# Patient Record
Sex: Female | Born: 1950 | Race: Black or African American | Hispanic: No | Marital: Single | Smoking: Former smoker
Health system: Southern US, Community
[De-identification: ages and names within clinical notes are randomized; demographics above are authoritative.]

## PROBLEM LIST (undated history)

## (undated) DIAGNOSIS — I739 Peripheral vascular disease, unspecified: Secondary | ICD-10-CM

## (undated) DIAGNOSIS — J45909 Unspecified asthma, uncomplicated: Secondary | ICD-10-CM

## (undated) HISTORY — PX: APPENDECTOMY: SHX54

## (undated) HISTORY — PX: ABOVE KNEE LEG AMPUTATION: SUR20

## (undated) HISTORY — PX: COLON SURGERY: SHX602

---

## 2014-07-19 DIAGNOSIS — S8990XA Unspecified injury of unspecified lower leg, initial encounter: Secondary | ICD-10-CM | POA: Diagnosis not present

## 2014-07-19 DIAGNOSIS — J45909 Unspecified asthma, uncomplicated: Secondary | ICD-10-CM | POA: Diagnosis not present

## 2014-07-19 DIAGNOSIS — F17200 Nicotine dependence, unspecified, uncomplicated: Secondary | ICD-10-CM | POA: Diagnosis not present

## 2014-07-19 DIAGNOSIS — S93409A Sprain of unspecified ligament of unspecified ankle, initial encounter: Secondary | ICD-10-CM | POA: Diagnosis not present

## 2014-07-19 DIAGNOSIS — S99919A Unspecified injury of unspecified ankle, initial encounter: Secondary | ICD-10-CM | POA: Diagnosis not present

## 2014-11-21 DIAGNOSIS — I739 Peripheral vascular disease, unspecified: Secondary | ICD-10-CM | POA: Diagnosis not present

## 2014-11-21 DIAGNOSIS — I70401 Unspecified atherosclerosis of autologous vein bypass graft(s) of the extremities, right leg: Secondary | ICD-10-CM | POA: Diagnosis not present

## 2014-11-21 DIAGNOSIS — I70402 Unspecified atherosclerosis of autologous vein bypass graft(s) of the extremities, left leg: Secondary | ICD-10-CM | POA: Diagnosis not present

## 2014-11-21 DIAGNOSIS — F17210 Nicotine dependence, cigarettes, uncomplicated: Secondary | ICD-10-CM | POA: Diagnosis not present

## 2014-11-21 DIAGNOSIS — J45909 Unspecified asthma, uncomplicated: Secondary | ICD-10-CM | POA: Diagnosis not present

## 2014-11-21 DIAGNOSIS — I771 Stricture of artery: Secondary | ICD-10-CM | POA: Diagnosis not present

## 2014-11-22 DIAGNOSIS — Z01818 Encounter for other preprocedural examination: Secondary | ICD-10-CM | POA: Diagnosis not present

## 2014-11-22 DIAGNOSIS — I739 Peripheral vascular disease, unspecified: Secondary | ICD-10-CM | POA: Diagnosis not present

## 2014-11-22 DIAGNOSIS — I2510 Atherosclerotic heart disease of native coronary artery without angina pectoris: Secondary | ICD-10-CM | POA: Diagnosis not present

## 2014-11-22 DIAGNOSIS — D72829 Elevated white blood cell count, unspecified: Secondary | ICD-10-CM | POA: Diagnosis not present

## 2014-11-22 DIAGNOSIS — K922 Gastrointestinal hemorrhage, unspecified: Secondary | ICD-10-CM | POA: Diagnosis not present

## 2014-11-22 DIAGNOSIS — M2011 Hallux valgus (acquired), right foot: Secondary | ICD-10-CM | POA: Diagnosis not present

## 2014-11-22 DIAGNOSIS — M79605 Pain in left leg: Secondary | ICD-10-CM | POA: Diagnosis not present

## 2014-11-22 DIAGNOSIS — I70223 Atherosclerosis of native arteries of extremities with rest pain, bilateral legs: Secondary | ICD-10-CM | POA: Diagnosis not present

## 2014-11-23 DIAGNOSIS — D72829 Elevated white blood cell count, unspecified: Secondary | ICD-10-CM | POA: Diagnosis not present

## 2014-11-23 DIAGNOSIS — K922 Gastrointestinal hemorrhage, unspecified: Secondary | ICD-10-CM | POA: Diagnosis not present

## 2014-11-23 DIAGNOSIS — I739 Peripheral vascular disease, unspecified: Secondary | ICD-10-CM | POA: Diagnosis not present

## 2014-11-23 DIAGNOSIS — K635 Polyp of colon: Secondary | ICD-10-CM | POA: Diagnosis not present

## 2014-11-23 DIAGNOSIS — M79605 Pain in left leg: Secondary | ICD-10-CM | POA: Diagnosis not present

## 2014-11-23 DIAGNOSIS — K625 Hemorrhage of anus and rectum: Secondary | ICD-10-CM | POA: Diagnosis not present

## 2014-11-24 DIAGNOSIS — D72829 Elevated white blood cell count, unspecified: Secondary | ICD-10-CM | POA: Diagnosis not present

## 2014-11-24 DIAGNOSIS — M79605 Pain in left leg: Secondary | ICD-10-CM | POA: Diagnosis not present

## 2014-11-24 DIAGNOSIS — I739 Peripheral vascular disease, unspecified: Secondary | ICD-10-CM | POA: Diagnosis not present

## 2014-11-24 DIAGNOSIS — K922 Gastrointestinal hemorrhage, unspecified: Secondary | ICD-10-CM | POA: Diagnosis not present

## 2014-11-24 DIAGNOSIS — K625 Hemorrhage of anus and rectum: Secondary | ICD-10-CM | POA: Diagnosis not present

## 2014-11-24 DIAGNOSIS — K635 Polyp of colon: Secondary | ICD-10-CM | POA: Diagnosis not present

## 2014-11-25 DIAGNOSIS — K922 Gastrointestinal hemorrhage, unspecified: Secondary | ICD-10-CM | POA: Diagnosis not present

## 2014-11-25 DIAGNOSIS — M79605 Pain in left leg: Secondary | ICD-10-CM | POA: Diagnosis not present

## 2014-11-25 DIAGNOSIS — D72829 Elevated white blood cell count, unspecified: Secondary | ICD-10-CM | POA: Diagnosis not present

## 2014-11-25 DIAGNOSIS — I739 Peripheral vascular disease, unspecified: Secondary | ICD-10-CM | POA: Diagnosis not present

## 2014-11-26 DIAGNOSIS — I739 Peripheral vascular disease, unspecified: Secondary | ICD-10-CM | POA: Diagnosis not present

## 2014-11-26 DIAGNOSIS — D72829 Elevated white blood cell count, unspecified: Secondary | ICD-10-CM | POA: Diagnosis not present

## 2014-11-26 DIAGNOSIS — K922 Gastrointestinal hemorrhage, unspecified: Secondary | ICD-10-CM | POA: Diagnosis not present

## 2014-11-26 DIAGNOSIS — M79605 Pain in left leg: Secondary | ICD-10-CM | POA: Diagnosis not present

## 2014-11-26 DIAGNOSIS — I70221 Atherosclerosis of native arteries of extremities with rest pain, right leg: Secondary | ICD-10-CM | POA: Diagnosis not present

## 2014-11-27 DIAGNOSIS — N390 Urinary tract infection, site not specified: Secondary | ICD-10-CM | POA: Diagnosis not present

## 2014-11-27 DIAGNOSIS — I739 Peripheral vascular disease, unspecified: Secondary | ICD-10-CM | POA: Diagnosis not present

## 2014-11-27 DIAGNOSIS — D72829 Elevated white blood cell count, unspecified: Secondary | ICD-10-CM | POA: Diagnosis not present

## 2014-11-27 DIAGNOSIS — M79605 Pain in left leg: Secondary | ICD-10-CM | POA: Diagnosis not present

## 2014-11-28 DIAGNOSIS — M79605 Pain in left leg: Secondary | ICD-10-CM | POA: Diagnosis not present

## 2014-11-28 DIAGNOSIS — N390 Urinary tract infection, site not specified: Secondary | ICD-10-CM | POA: Diagnosis not present

## 2014-11-28 DIAGNOSIS — I739 Peripheral vascular disease, unspecified: Secondary | ICD-10-CM | POA: Diagnosis not present

## 2014-11-28 DIAGNOSIS — D72829 Elevated white blood cell count, unspecified: Secondary | ICD-10-CM | POA: Diagnosis not present

## 2014-11-29 DIAGNOSIS — K5669 Other intestinal obstruction: Secondary | ICD-10-CM | POA: Diagnosis not present

## 2014-11-29 DIAGNOSIS — N390 Urinary tract infection, site not specified: Secondary | ICD-10-CM | POA: Diagnosis not present

## 2014-11-29 DIAGNOSIS — I739 Peripheral vascular disease, unspecified: Secondary | ICD-10-CM | POA: Diagnosis not present

## 2014-11-29 DIAGNOSIS — B952 Enterococcus as the cause of diseases classified elsewhere: Secondary | ICD-10-CM | POA: Diagnosis not present

## 2014-11-29 DIAGNOSIS — K5780 Diverticulitis of intestine, part unspecified, with perforation and abscess without bleeding: Secondary | ICD-10-CM | POA: Diagnosis not present

## 2014-11-29 DIAGNOSIS — K632 Fistula of intestine: Secondary | ICD-10-CM | POA: Diagnosis not present

## 2014-11-29 DIAGNOSIS — M79605 Pain in left leg: Secondary | ICD-10-CM | POA: Diagnosis not present

## 2014-11-29 DIAGNOSIS — M726 Necrotizing fasciitis: Secondary | ICD-10-CM | POA: Diagnosis not present

## 2014-11-29 DIAGNOSIS — K5660 Unspecified intestinal obstruction: Secondary | ICD-10-CM | POA: Diagnosis not present

## 2014-11-29 DIAGNOSIS — D72829 Elevated white blood cell count, unspecified: Secondary | ICD-10-CM | POA: Diagnosis not present

## 2014-11-30 DIAGNOSIS — N390 Urinary tract infection, site not specified: Secondary | ICD-10-CM | POA: Diagnosis not present

## 2014-11-30 DIAGNOSIS — D72829 Elevated white blood cell count, unspecified: Secondary | ICD-10-CM | POA: Diagnosis not present

## 2014-11-30 DIAGNOSIS — M79605 Pain in left leg: Secondary | ICD-10-CM | POA: Diagnosis not present

## 2014-11-30 DIAGNOSIS — I739 Peripheral vascular disease, unspecified: Secondary | ICD-10-CM | POA: Diagnosis not present

## 2014-12-01 DIAGNOSIS — N390 Urinary tract infection, site not specified: Secondary | ICD-10-CM | POA: Diagnosis not present

## 2014-12-01 DIAGNOSIS — K5660 Unspecified intestinal obstruction: Secondary | ICD-10-CM | POA: Diagnosis not present

## 2014-12-01 DIAGNOSIS — D72829 Elevated white blood cell count, unspecified: Secondary | ICD-10-CM | POA: Diagnosis not present

## 2014-12-01 DIAGNOSIS — K5669 Other intestinal obstruction: Secondary | ICD-10-CM | POA: Diagnosis not present

## 2014-12-01 DIAGNOSIS — K8020 Calculus of gallbladder without cholecystitis without obstruction: Secondary | ICD-10-CM | POA: Diagnosis not present

## 2014-12-01 DIAGNOSIS — M79605 Pain in left leg: Secondary | ICD-10-CM | POA: Diagnosis not present

## 2014-12-01 DIAGNOSIS — Z01810 Encounter for preprocedural cardiovascular examination: Secondary | ICD-10-CM | POA: Diagnosis not present

## 2014-12-01 DIAGNOSIS — I739 Peripheral vascular disease, unspecified: Secondary | ICD-10-CM | POA: Diagnosis not present

## 2014-12-02 DIAGNOSIS — K5660 Unspecified intestinal obstruction: Secondary | ICD-10-CM | POA: Diagnosis not present

## 2014-12-02 DIAGNOSIS — D72829 Elevated white blood cell count, unspecified: Secondary | ICD-10-CM | POA: Diagnosis not present

## 2014-12-02 DIAGNOSIS — K631 Perforation of intestine (nontraumatic): Secondary | ICD-10-CM | POA: Diagnosis not present

## 2014-12-02 DIAGNOSIS — I739 Peripheral vascular disease, unspecified: Secondary | ICD-10-CM | POA: Diagnosis not present

## 2014-12-02 DIAGNOSIS — Z4682 Encounter for fitting and adjustment of non-vascular catheter: Secondary | ICD-10-CM | POA: Diagnosis not present

## 2014-12-02 DIAGNOSIS — M79605 Pain in left leg: Secondary | ICD-10-CM | POA: Diagnosis not present

## 2014-12-02 DIAGNOSIS — N390 Urinary tract infection, site not specified: Secondary | ICD-10-CM | POA: Diagnosis not present

## 2014-12-02 DIAGNOSIS — K635 Polyp of colon: Secondary | ICD-10-CM | POA: Diagnosis not present

## 2014-12-03 DIAGNOSIS — D72829 Elevated white blood cell count, unspecified: Secondary | ICD-10-CM | POA: Diagnosis not present

## 2014-12-03 DIAGNOSIS — K922 Gastrointestinal hemorrhage, unspecified: Secondary | ICD-10-CM | POA: Diagnosis not present

## 2014-12-03 DIAGNOSIS — K631 Perforation of intestine (nontraumatic): Secondary | ICD-10-CM | POA: Diagnosis not present

## 2014-12-03 DIAGNOSIS — I739 Peripheral vascular disease, unspecified: Secondary | ICD-10-CM | POA: Diagnosis not present

## 2014-12-03 DIAGNOSIS — M79605 Pain in left leg: Secondary | ICD-10-CM | POA: Diagnosis not present

## 2014-12-04 DIAGNOSIS — M79605 Pain in left leg: Secondary | ICD-10-CM | POA: Diagnosis not present

## 2014-12-04 DIAGNOSIS — D72829 Elevated white blood cell count, unspecified: Secondary | ICD-10-CM | POA: Diagnosis not present

## 2014-12-04 DIAGNOSIS — K651 Peritoneal abscess: Secondary | ICD-10-CM | POA: Diagnosis not present

## 2014-12-04 DIAGNOSIS — J9690 Respiratory failure, unspecified, unspecified whether with hypoxia or hypercapnia: Secondary | ICD-10-CM | POA: Diagnosis not present

## 2014-12-04 DIAGNOSIS — A419 Sepsis, unspecified organism: Secondary | ICD-10-CM | POA: Diagnosis not present

## 2014-12-04 DIAGNOSIS — K922 Gastrointestinal hemorrhage, unspecified: Secondary | ICD-10-CM | POA: Diagnosis not present

## 2014-12-04 DIAGNOSIS — I743 Embolism and thrombosis of arteries of the lower extremities: Secondary | ICD-10-CM | POA: Diagnosis not present

## 2014-12-04 DIAGNOSIS — I739 Peripheral vascular disease, unspecified: Secondary | ICD-10-CM | POA: Diagnosis not present

## 2014-12-05 DIAGNOSIS — M79605 Pain in left leg: Secondary | ICD-10-CM | POA: Diagnosis not present

## 2014-12-05 DIAGNOSIS — D72829 Elevated white blood cell count, unspecified: Secondary | ICD-10-CM | POA: Diagnosis not present

## 2014-12-05 DIAGNOSIS — I739 Peripheral vascular disease, unspecified: Secondary | ICD-10-CM | POA: Diagnosis not present

## 2014-12-05 DIAGNOSIS — K922 Gastrointestinal hemorrhage, unspecified: Secondary | ICD-10-CM | POA: Diagnosis not present

## 2014-12-06 DIAGNOSIS — I70201 Unspecified atherosclerosis of native arteries of extremities, right leg: Secondary | ICD-10-CM | POA: Diagnosis not present

## 2014-12-06 DIAGNOSIS — K635 Polyp of colon: Secondary | ICD-10-CM | POA: Diagnosis not present

## 2014-12-06 DIAGNOSIS — I2510 Atherosclerotic heart disease of native coronary artery without angina pectoris: Secondary | ICD-10-CM | POA: Diagnosis not present

## 2014-12-06 DIAGNOSIS — M726 Necrotizing fasciitis: Secondary | ICD-10-CM | POA: Diagnosis not present

## 2014-12-06 DIAGNOSIS — J9811 Atelectasis: Secondary | ICD-10-CM | POA: Diagnosis not present

## 2014-12-06 DIAGNOSIS — K5660 Unspecified intestinal obstruction: Secondary | ICD-10-CM | POA: Diagnosis not present

## 2014-12-06 DIAGNOSIS — K922 Gastrointestinal hemorrhage, unspecified: Secondary | ICD-10-CM | POA: Diagnosis not present

## 2014-12-06 DIAGNOSIS — K630 Abscess of intestine: Secondary | ICD-10-CM | POA: Diagnosis not present

## 2014-12-06 DIAGNOSIS — K5780 Diverticulitis of intestine, part unspecified, with perforation and abscess without bleeding: Secondary | ICD-10-CM | POA: Diagnosis not present

## 2014-12-06 DIAGNOSIS — M79605 Pain in left leg: Secondary | ICD-10-CM | POA: Diagnosis not present

## 2014-12-06 DIAGNOSIS — D125 Benign neoplasm of sigmoid colon: Secondary | ICD-10-CM | POA: Diagnosis not present

## 2014-12-06 DIAGNOSIS — B952 Enterococcus as the cause of diseases classified elsewhere: Secondary | ICD-10-CM | POA: Diagnosis not present

## 2014-12-06 DIAGNOSIS — K5669 Other intestinal obstruction: Secondary | ICD-10-CM | POA: Diagnosis not present

## 2014-12-06 DIAGNOSIS — D72829 Elevated white blood cell count, unspecified: Secondary | ICD-10-CM | POA: Diagnosis not present

## 2014-12-06 DIAGNOSIS — I739 Peripheral vascular disease, unspecified: Secondary | ICD-10-CM | POA: Diagnosis not present

## 2014-12-06 DIAGNOSIS — K632 Fistula of intestine: Secondary | ICD-10-CM | POA: Diagnosis not present

## 2014-12-07 DIAGNOSIS — I509 Heart failure, unspecified: Secondary | ICD-10-CM | POA: Diagnosis not present

## 2014-12-07 DIAGNOSIS — R918 Other nonspecific abnormal finding of lung field: Secondary | ICD-10-CM | POA: Diagnosis not present

## 2014-12-07 DIAGNOSIS — Z9889 Other specified postprocedural states: Secondary | ICD-10-CM | POA: Diagnosis not present

## 2014-12-07 DIAGNOSIS — K567 Ileus, unspecified: Secondary | ICD-10-CM | POA: Diagnosis not present

## 2014-12-07 DIAGNOSIS — J90 Pleural effusion, not elsewhere classified: Secondary | ICD-10-CM | POA: Diagnosis not present

## 2014-12-07 DIAGNOSIS — I739 Peripheral vascular disease, unspecified: Secondary | ICD-10-CM | POA: Diagnosis not present

## 2014-12-08 DIAGNOSIS — I739 Peripheral vascular disease, unspecified: Secondary | ICD-10-CM | POA: Diagnosis not present

## 2014-12-08 DIAGNOSIS — R0689 Other abnormalities of breathing: Secondary | ICD-10-CM | POA: Diagnosis not present

## 2014-12-08 DIAGNOSIS — K567 Ileus, unspecified: Secondary | ICD-10-CM | POA: Diagnosis not present

## 2014-12-09 DIAGNOSIS — I743 Embolism and thrombosis of arteries of the lower extremities: Secondary | ICD-10-CM | POA: Diagnosis not present

## 2014-12-09 DIAGNOSIS — J9690 Respiratory failure, unspecified, unspecified whether with hypoxia or hypercapnia: Secondary | ICD-10-CM | POA: Diagnosis not present

## 2014-12-09 DIAGNOSIS — K651 Peritoneal abscess: Secondary | ICD-10-CM | POA: Diagnosis not present

## 2014-12-09 DIAGNOSIS — K567 Ileus, unspecified: Secondary | ICD-10-CM | POA: Diagnosis not present

## 2014-12-09 DIAGNOSIS — J811 Chronic pulmonary edema: Secondary | ICD-10-CM | POA: Diagnosis not present

## 2014-12-09 DIAGNOSIS — I739 Peripheral vascular disease, unspecified: Secondary | ICD-10-CM | POA: Diagnosis not present

## 2014-12-09 DIAGNOSIS — A419 Sepsis, unspecified organism: Secondary | ICD-10-CM | POA: Diagnosis not present

## 2014-12-10 DIAGNOSIS — K567 Ileus, unspecified: Secondary | ICD-10-CM | POA: Diagnosis not present

## 2014-12-10 DIAGNOSIS — I739 Peripheral vascular disease, unspecified: Secondary | ICD-10-CM | POA: Diagnosis not present

## 2014-12-10 DIAGNOSIS — Z4682 Encounter for fitting and adjustment of non-vascular catheter: Secondary | ICD-10-CM | POA: Diagnosis not present

## 2014-12-10 DIAGNOSIS — J811 Chronic pulmonary edema: Secondary | ICD-10-CM | POA: Diagnosis not present

## 2014-12-11 DIAGNOSIS — K567 Ileus, unspecified: Secondary | ICD-10-CM | POA: Diagnosis not present

## 2014-12-11 DIAGNOSIS — I739 Peripheral vascular disease, unspecified: Secondary | ICD-10-CM | POA: Diagnosis not present

## 2014-12-11 DIAGNOSIS — R918 Other nonspecific abnormal finding of lung field: Secondary | ICD-10-CM | POA: Diagnosis not present

## 2014-12-11 DIAGNOSIS — J90 Pleural effusion, not elsewhere classified: Secondary | ICD-10-CM | POA: Diagnosis not present

## 2014-12-12 DIAGNOSIS — K651 Peritoneal abscess: Secondary | ICD-10-CM | POA: Diagnosis not present

## 2014-12-12 DIAGNOSIS — A419 Sepsis, unspecified organism: Secondary | ICD-10-CM | POA: Diagnosis not present

## 2014-12-12 DIAGNOSIS — I739 Peripheral vascular disease, unspecified: Secondary | ICD-10-CM | POA: Diagnosis not present

## 2014-12-12 DIAGNOSIS — R918 Other nonspecific abnormal finding of lung field: Secondary | ICD-10-CM | POA: Diagnosis not present

## 2014-12-12 DIAGNOSIS — I743 Embolism and thrombosis of arteries of the lower extremities: Secondary | ICD-10-CM | POA: Diagnosis not present

## 2014-12-12 DIAGNOSIS — K567 Ileus, unspecified: Secondary | ICD-10-CM | POA: Diagnosis not present

## 2014-12-12 DIAGNOSIS — J9690 Respiratory failure, unspecified, unspecified whether with hypoxia or hypercapnia: Secondary | ICD-10-CM | POA: Diagnosis not present

## 2014-12-13 DIAGNOSIS — K8020 Calculus of gallbladder without cholecystitis without obstruction: Secondary | ICD-10-CM | POA: Diagnosis not present

## 2014-12-13 DIAGNOSIS — R0602 Shortness of breath: Secondary | ICD-10-CM | POA: Diagnosis not present

## 2014-12-13 DIAGNOSIS — Z4682 Encounter for fitting and adjustment of non-vascular catheter: Secondary | ICD-10-CM | POA: Diagnosis not present

## 2014-12-13 DIAGNOSIS — R188 Other ascites: Secondary | ICD-10-CM | POA: Diagnosis not present

## 2014-12-13 DIAGNOSIS — D3500 Benign neoplasm of unspecified adrenal gland: Secondary | ICD-10-CM | POA: Diagnosis not present

## 2014-12-16 DIAGNOSIS — K630 Abscess of intestine: Secondary | ICD-10-CM | POA: Diagnosis not present

## 2014-12-16 DIAGNOSIS — I70201 Unspecified atherosclerosis of native arteries of extremities, right leg: Secondary | ICD-10-CM | POA: Diagnosis not present

## 2014-12-16 DIAGNOSIS — J9811 Atelectasis: Secondary | ICD-10-CM | POA: Diagnosis not present

## 2014-12-16 DIAGNOSIS — K651 Peritoneal abscess: Secondary | ICD-10-CM | POA: Diagnosis not present

## 2014-12-19 DIAGNOSIS — A419 Sepsis, unspecified organism: Secondary | ICD-10-CM | POA: Diagnosis not present

## 2014-12-19 DIAGNOSIS — K651 Peritoneal abscess: Secondary | ICD-10-CM | POA: Diagnosis not present

## 2014-12-19 DIAGNOSIS — I743 Embolism and thrombosis of arteries of the lower extremities: Secondary | ICD-10-CM | POA: Diagnosis not present

## 2014-12-19 DIAGNOSIS — J9690 Respiratory failure, unspecified, unspecified whether with hypoxia or hypercapnia: Secondary | ICD-10-CM | POA: Diagnosis not present

## 2014-12-22 DIAGNOSIS — K651 Peritoneal abscess: Secondary | ICD-10-CM | POA: Diagnosis not present

## 2014-12-22 DIAGNOSIS — I743 Embolism and thrombosis of arteries of the lower extremities: Secondary | ICD-10-CM | POA: Diagnosis not present

## 2014-12-22 DIAGNOSIS — R918 Other nonspecific abnormal finding of lung field: Secondary | ICD-10-CM | POA: Diagnosis not present

## 2014-12-22 DIAGNOSIS — A419 Sepsis, unspecified organism: Secondary | ICD-10-CM | POA: Diagnosis not present

## 2014-12-22 DIAGNOSIS — J9690 Respiratory failure, unspecified, unspecified whether with hypoxia or hypercapnia: Secondary | ICD-10-CM | POA: Diagnosis not present

## 2014-12-23 DIAGNOSIS — R140 Abdominal distension (gaseous): Secondary | ICD-10-CM | POA: Diagnosis not present

## 2014-12-23 DIAGNOSIS — K630 Abscess of intestine: Secondary | ICD-10-CM | POA: Diagnosis not present

## 2014-12-23 DIAGNOSIS — B952 Enterococcus as the cause of diseases classified elsewhere: Secondary | ICD-10-CM | POA: Diagnosis not present

## 2014-12-23 DIAGNOSIS — T814XXA Infection following a procedure, initial encounter: Secondary | ICD-10-CM | POA: Diagnosis not present

## 2014-12-23 DIAGNOSIS — K567 Ileus, unspecified: Secondary | ICD-10-CM | POA: Diagnosis not present

## 2014-12-23 DIAGNOSIS — K5780 Diverticulitis of intestine, part unspecified, with perforation and abscess without bleeding: Secondary | ICD-10-CM | POA: Diagnosis not present

## 2014-12-23 DIAGNOSIS — K5669 Other intestinal obstruction: Secondary | ICD-10-CM | POA: Diagnosis not present

## 2014-12-23 DIAGNOSIS — K632 Fistula of intestine: Secondary | ICD-10-CM | POA: Diagnosis not present

## 2014-12-23 DIAGNOSIS — M726 Necrotizing fasciitis: Secondary | ICD-10-CM | POA: Diagnosis not present

## 2014-12-24 DIAGNOSIS — R140 Abdominal distension (gaseous): Secondary | ICD-10-CM | POA: Diagnosis not present

## 2014-12-26 DIAGNOSIS — I739 Peripheral vascular disease, unspecified: Secondary | ICD-10-CM | POA: Diagnosis not present

## 2014-12-26 DIAGNOSIS — K651 Peritoneal abscess: Secondary | ICD-10-CM | POA: Diagnosis not present

## 2014-12-26 DIAGNOSIS — K5669 Other intestinal obstruction: Secondary | ICD-10-CM | POA: Diagnosis not present

## 2014-12-27 DIAGNOSIS — R140 Abdominal distension (gaseous): Secondary | ICD-10-CM | POA: Diagnosis not present

## 2014-12-28 DIAGNOSIS — K5669 Other intestinal obstruction: Secondary | ICD-10-CM | POA: Diagnosis not present

## 2014-12-28 DIAGNOSIS — I739 Peripheral vascular disease, unspecified: Secondary | ICD-10-CM | POA: Diagnosis not present

## 2014-12-28 DIAGNOSIS — K651 Peritoneal abscess: Secondary | ICD-10-CM | POA: Diagnosis not present

## 2014-12-28 DIAGNOSIS — R140 Abdominal distension (gaseous): Secondary | ICD-10-CM | POA: Diagnosis not present

## 2014-12-29 DIAGNOSIS — K5669 Other intestinal obstruction: Secondary | ICD-10-CM | POA: Diagnosis not present

## 2014-12-29 DIAGNOSIS — I739 Peripheral vascular disease, unspecified: Secondary | ICD-10-CM | POA: Diagnosis not present

## 2014-12-29 DIAGNOSIS — K651 Peritoneal abscess: Secondary | ICD-10-CM | POA: Diagnosis not present

## 2014-12-30 DIAGNOSIS — R601 Generalized edema: Secondary | ICD-10-CM | POA: Diagnosis not present

## 2014-12-30 DIAGNOSIS — M79604 Pain in right leg: Secondary | ICD-10-CM | POA: Diagnosis not present

## 2014-12-31 DIAGNOSIS — R7989 Other specified abnormal findings of blood chemistry: Secondary | ICD-10-CM | POA: Diagnosis not present

## 2015-01-01 DIAGNOSIS — A419 Sepsis, unspecified organism: Secondary | ICD-10-CM | POA: Diagnosis not present

## 2015-01-01 DIAGNOSIS — K651 Peritoneal abscess: Secondary | ICD-10-CM | POA: Diagnosis not present

## 2015-01-01 DIAGNOSIS — J9811 Atelectasis: Secondary | ICD-10-CM | POA: Diagnosis not present

## 2015-01-01 DIAGNOSIS — J9690 Respiratory failure, unspecified, unspecified whether with hypoxia or hypercapnia: Secondary | ICD-10-CM | POA: Diagnosis not present

## 2015-01-01 DIAGNOSIS — I743 Embolism and thrombosis of arteries of the lower extremities: Secondary | ICD-10-CM | POA: Diagnosis not present

## 2015-01-02 DIAGNOSIS — N179 Acute kidney failure, unspecified: Secondary | ICD-10-CM | POA: Diagnosis not present

## 2015-01-02 DIAGNOSIS — I739 Peripheral vascular disease, unspecified: Secondary | ICD-10-CM | POA: Diagnosis not present

## 2015-01-02 DIAGNOSIS — K651 Peritoneal abscess: Secondary | ICD-10-CM | POA: Diagnosis not present

## 2015-01-04 DIAGNOSIS — B999 Unspecified infectious disease: Secondary | ICD-10-CM | POA: Diagnosis not present

## 2015-01-04 DIAGNOSIS — R509 Fever, unspecified: Secondary | ICD-10-CM | POA: Diagnosis not present

## 2015-01-06 DIAGNOSIS — Z4682 Encounter for fitting and adjustment of non-vascular catheter: Secondary | ICD-10-CM | POA: Diagnosis not present

## 2015-01-07 DIAGNOSIS — T814XXA Infection following a procedure, initial encounter: Secondary | ICD-10-CM | POA: Diagnosis not present

## 2015-01-09 DIAGNOSIS — J90 Pleural effusion, not elsewhere classified: Secondary | ICD-10-CM | POA: Diagnosis not present

## 2015-01-09 DIAGNOSIS — Z4682 Encounter for fitting and adjustment of non-vascular catheter: Secondary | ICD-10-CM | POA: Diagnosis not present

## 2015-01-09 DIAGNOSIS — D72829 Elevated white blood cell count, unspecified: Secondary | ICD-10-CM | POA: Diagnosis not present

## 2015-01-09 DIAGNOSIS — K8020 Calculus of gallbladder without cholecystitis without obstruction: Secondary | ICD-10-CM | POA: Diagnosis not present

## 2015-01-09 DIAGNOSIS — K5660 Unspecified intestinal obstruction: Secondary | ICD-10-CM | POA: Diagnosis not present

## 2015-01-09 DIAGNOSIS — K760 Fatty (change of) liver, not elsewhere classified: Secondary | ICD-10-CM | POA: Diagnosis not present

## 2015-01-14 DIAGNOSIS — Z4682 Encounter for fitting and adjustment of non-vascular catheter: Secondary | ICD-10-CM | POA: Diagnosis not present

## 2015-01-15 DIAGNOSIS — K630 Abscess of intestine: Secondary | ICD-10-CM | POA: Diagnosis not present

## 2015-01-17 DIAGNOSIS — R109 Unspecified abdominal pain: Secondary | ICD-10-CM | POA: Diagnosis not present

## 2015-01-18 DIAGNOSIS — R079 Chest pain, unspecified: Secondary | ICD-10-CM | POA: Diagnosis not present

## 2015-01-18 DIAGNOSIS — J9811 Atelectasis: Secondary | ICD-10-CM | POA: Diagnosis not present

## 2015-01-20 DIAGNOSIS — N179 Acute kidney failure, unspecified: Secondary | ICD-10-CM | POA: Diagnosis not present

## 2015-01-20 DIAGNOSIS — R509 Fever, unspecified: Secondary | ICD-10-CM | POA: Diagnosis not present

## 2015-01-20 DIAGNOSIS — K630 Abscess of intestine: Secondary | ICD-10-CM | POA: Diagnosis not present

## 2015-01-23 DIAGNOSIS — K651 Peritoneal abscess: Secondary | ICD-10-CM | POA: Diagnosis not present

## 2015-01-23 DIAGNOSIS — K630 Abscess of intestine: Secondary | ICD-10-CM | POA: Diagnosis not present

## 2015-01-23 DIAGNOSIS — A419 Sepsis, unspecified organism: Secondary | ICD-10-CM | POA: Diagnosis not present

## 2015-01-23 DIAGNOSIS — J9690 Respiratory failure, unspecified, unspecified whether with hypoxia or hypercapnia: Secondary | ICD-10-CM | POA: Diagnosis not present

## 2015-01-23 DIAGNOSIS — R509 Fever, unspecified: Secondary | ICD-10-CM | POA: Diagnosis not present

## 2015-01-23 DIAGNOSIS — D72829 Elevated white blood cell count, unspecified: Secondary | ICD-10-CM | POA: Diagnosis not present

## 2015-01-23 DIAGNOSIS — I743 Embolism and thrombosis of arteries of the lower extremities: Secondary | ICD-10-CM | POA: Diagnosis not present

## 2015-01-24 DIAGNOSIS — R509 Fever, unspecified: Secondary | ICD-10-CM | POA: Diagnosis not present

## 2015-01-24 DIAGNOSIS — A4152 Sepsis due to Pseudomonas: Secondary | ICD-10-CM | POA: Diagnosis not present

## 2015-01-24 DIAGNOSIS — K630 Abscess of intestine: Secondary | ICD-10-CM | POA: Diagnosis not present

## 2015-01-24 DIAGNOSIS — I743 Embolism and thrombosis of arteries of the lower extremities: Secondary | ICD-10-CM | POA: Diagnosis not present

## 2015-01-25 DIAGNOSIS — K651 Peritoneal abscess: Secondary | ICD-10-CM | POA: Diagnosis not present

## 2015-01-25 DIAGNOSIS — A419 Sepsis, unspecified organism: Secondary | ICD-10-CM | POA: Diagnosis not present

## 2015-01-25 DIAGNOSIS — I743 Embolism and thrombosis of arteries of the lower extremities: Secondary | ICD-10-CM | POA: Diagnosis not present

## 2015-01-25 DIAGNOSIS — J9690 Respiratory failure, unspecified, unspecified whether with hypoxia or hypercapnia: Secondary | ICD-10-CM | POA: Diagnosis not present

## 2015-01-26 DIAGNOSIS — A4152 Sepsis due to Pseudomonas: Secondary | ICD-10-CM | POA: Diagnosis not present

## 2015-01-26 DIAGNOSIS — K630 Abscess of intestine: Secondary | ICD-10-CM | POA: Diagnosis not present

## 2015-01-26 DIAGNOSIS — R509 Fever, unspecified: Secondary | ICD-10-CM | POA: Diagnosis not present

## 2015-01-26 DIAGNOSIS — I743 Embolism and thrombosis of arteries of the lower extremities: Secondary | ICD-10-CM | POA: Diagnosis not present

## 2015-01-27 DIAGNOSIS — I96 Gangrene, not elsewhere classified: Secondary | ICD-10-CM | POA: Diagnosis not present

## 2015-01-28 DIAGNOSIS — R188 Other ascites: Secondary | ICD-10-CM | POA: Diagnosis not present

## 2015-01-28 DIAGNOSIS — J918 Pleural effusion in other conditions classified elsewhere: Secondary | ICD-10-CM | POA: Diagnosis not present

## 2015-01-28 DIAGNOSIS — I96 Gangrene, not elsewhere classified: Secondary | ICD-10-CM | POA: Diagnosis not present

## 2015-01-30 DIAGNOSIS — I96 Gangrene, not elsewhere classified: Secondary | ICD-10-CM | POA: Diagnosis not present

## 2015-02-01 DIAGNOSIS — I2510 Atherosclerotic heart disease of native coronary artery without angina pectoris: Secondary | ICD-10-CM | POA: Diagnosis not present

## 2015-02-02 DIAGNOSIS — I739 Peripheral vascular disease, unspecified: Secondary | ICD-10-CM | POA: Diagnosis not present

## 2015-02-02 DIAGNOSIS — K567 Ileus, unspecified: Secondary | ICD-10-CM | POA: Diagnosis not present

## 2015-02-03 DIAGNOSIS — S31609A Unspecified open wound of abdominal wall, unspecified quadrant with penetration into peritoneal cavity, initial encounter: Secondary | ICD-10-CM | POA: Diagnosis not present

## 2015-02-03 DIAGNOSIS — I70261 Atherosclerosis of native arteries of extremities with gangrene, right leg: Secondary | ICD-10-CM | POA: Diagnosis not present

## 2015-02-03 DIAGNOSIS — S31109A Unspecified open wound of abdominal wall, unspecified quadrant without penetration into peritoneal cavity, initial encounter: Secondary | ICD-10-CM | POA: Diagnosis not present

## 2015-02-03 DIAGNOSIS — I96 Gangrene, not elsewhere classified: Secondary | ICD-10-CM | POA: Diagnosis not present

## 2015-02-04 DIAGNOSIS — A4152 Sepsis due to Pseudomonas: Secondary | ICD-10-CM | POA: Diagnosis not present

## 2015-02-04 DIAGNOSIS — I743 Embolism and thrombosis of arteries of the lower extremities: Secondary | ICD-10-CM | POA: Diagnosis not present

## 2015-02-04 DIAGNOSIS — R509 Fever, unspecified: Secondary | ICD-10-CM | POA: Diagnosis not present

## 2015-02-04 DIAGNOSIS — K630 Abscess of intestine: Secondary | ICD-10-CM | POA: Diagnosis not present

## 2015-02-08 DIAGNOSIS — M79601 Pain in right arm: Secondary | ICD-10-CM | POA: Diagnosis not present

## 2015-02-08 DIAGNOSIS — R601 Generalized edema: Secondary | ICD-10-CM | POA: Diagnosis not present

## 2015-02-08 DIAGNOSIS — M79602 Pain in left arm: Secondary | ICD-10-CM | POA: Diagnosis not present

## 2015-02-09 DIAGNOSIS — R109 Unspecified abdominal pain: Secondary | ICD-10-CM | POA: Diagnosis not present

## 2015-02-10 DIAGNOSIS — I743 Embolism and thrombosis of arteries of the lower extremities: Secondary | ICD-10-CM | POA: Diagnosis not present

## 2015-02-10 DIAGNOSIS — K651 Peritoneal abscess: Secondary | ICD-10-CM | POA: Diagnosis not present

## 2015-02-10 DIAGNOSIS — K5660 Unspecified intestinal obstruction: Secondary | ICD-10-CM | POA: Diagnosis not present

## 2015-02-11 DIAGNOSIS — K632 Fistula of intestine: Secondary | ICD-10-CM | POA: Diagnosis not present

## 2015-02-11 DIAGNOSIS — K651 Peritoneal abscess: Secondary | ICD-10-CM | POA: Diagnosis not present

## 2015-02-11 DIAGNOSIS — B952 Enterococcus as the cause of diseases classified elsewhere: Secondary | ICD-10-CM | POA: Diagnosis present

## 2015-02-11 DIAGNOSIS — Z1621 Resistance to vancomycin: Secondary | ICD-10-CM | POA: Diagnosis present

## 2015-02-11 DIAGNOSIS — J9601 Acute respiratory failure with hypoxia: Secondary | ICD-10-CM | POA: Diagnosis not present

## 2015-02-11 DIAGNOSIS — Z5189 Encounter for other specified aftercare: Secondary | ICD-10-CM | POA: Diagnosis not present

## 2015-02-11 DIAGNOSIS — Z4682 Encounter for fitting and adjustment of non-vascular catheter: Secondary | ICD-10-CM | POA: Diagnosis not present

## 2015-02-11 DIAGNOSIS — Z1635 Resistance to multiple antimicrobial drugs: Secondary | ICD-10-CM | POA: Diagnosis not present

## 2015-02-11 DIAGNOSIS — E876 Hypokalemia: Secondary | ICD-10-CM | POA: Diagnosis present

## 2015-02-11 DIAGNOSIS — J90 Pleural effusion, not elsewhere classified: Secondary | ICD-10-CM | POA: Diagnosis present

## 2015-02-11 DIAGNOSIS — I743 Embolism and thrombosis of arteries of the lower extremities: Secondary | ICD-10-CM | POA: Diagnosis not present

## 2015-02-11 DIAGNOSIS — Z720 Tobacco use: Secondary | ICD-10-CM | POA: Diagnosis not present

## 2015-02-11 DIAGNOSIS — I739 Peripheral vascular disease, unspecified: Secondary | ICD-10-CM | POA: Diagnosis present

## 2015-02-11 DIAGNOSIS — I10 Essential (primary) hypertension: Secondary | ICD-10-CM | POA: Diagnosis present

## 2015-02-11 DIAGNOSIS — I70293 Other atherosclerosis of native arteries of extremities, bilateral legs: Secondary | ICD-10-CM | POA: Diagnosis not present

## 2015-02-11 DIAGNOSIS — Z4781 Encounter for orthopedic aftercare following surgical amputation: Secondary | ICD-10-CM | POA: Diagnosis not present

## 2015-02-11 DIAGNOSIS — Z7982 Long term (current) use of aspirin: Secondary | ICD-10-CM | POA: Diagnosis not present

## 2015-02-11 DIAGNOSIS — Z89611 Acquired absence of right leg above knee: Secondary | ICD-10-CM | POA: Diagnosis not present

## 2015-02-11 DIAGNOSIS — J811 Chronic pulmonary edema: Secondary | ICD-10-CM | POA: Diagnosis not present

## 2015-02-11 DIAGNOSIS — A419 Sepsis, unspecified organism: Secondary | ICD-10-CM | POA: Diagnosis not present

## 2015-02-11 DIAGNOSIS — K5660 Unspecified intestinal obstruction: Secondary | ICD-10-CM | POA: Diagnosis not present

## 2015-02-11 DIAGNOSIS — I96 Gangrene, not elsewhere classified: Secondary | ICD-10-CM | POA: Diagnosis present

## 2015-02-11 DIAGNOSIS — D649 Anemia, unspecified: Secondary | ICD-10-CM | POA: Diagnosis present

## 2015-02-15 DIAGNOSIS — D62 Acute posthemorrhagic anemia: Secondary | ICD-10-CM | POA: Diagnosis not present

## 2015-02-15 DIAGNOSIS — K5660 Unspecified intestinal obstruction: Secondary | ICD-10-CM | POA: Diagnosis not present

## 2015-02-15 DIAGNOSIS — R109 Unspecified abdominal pain: Secondary | ICD-10-CM | POA: Diagnosis not present

## 2015-02-15 DIAGNOSIS — Z89611 Acquired absence of right leg above knee: Secondary | ICD-10-CM | POA: Diagnosis not present

## 2015-02-15 DIAGNOSIS — I743 Embolism and thrombosis of arteries of the lower extremities: Secondary | ICD-10-CM | POA: Diagnosis not present

## 2015-02-15 DIAGNOSIS — E876 Hypokalemia: Secondary | ICD-10-CM | POA: Diagnosis not present

## 2015-02-15 DIAGNOSIS — N321 Vesicointestinal fistula: Secondary | ICD-10-CM | POA: Diagnosis not present

## 2015-02-15 DIAGNOSIS — K659 Peritonitis, unspecified: Secondary | ICD-10-CM | POA: Diagnosis not present

## 2015-02-15 DIAGNOSIS — F17210 Nicotine dependence, cigarettes, uncomplicated: Secondary | ICD-10-CM | POA: Diagnosis present

## 2015-02-15 DIAGNOSIS — Z1635 Resistance to multiple antimicrobial drugs: Secondary | ICD-10-CM | POA: Diagnosis not present

## 2015-02-15 DIAGNOSIS — K5792 Diverticulitis of intestine, part unspecified, without perforation or abscess without bleeding: Secondary | ICD-10-CM | POA: Diagnosis present

## 2015-02-15 DIAGNOSIS — J45909 Unspecified asthma, uncomplicated: Secondary | ICD-10-CM | POA: Diagnosis present

## 2015-02-15 DIAGNOSIS — I779 Disorder of arteries and arterioles, unspecified: Secondary | ICD-10-CM | POA: Diagnosis present

## 2015-02-15 DIAGNOSIS — Z8719 Personal history of other diseases of the digestive system: Secondary | ICD-10-CM | POA: Diagnosis not present

## 2015-02-15 DIAGNOSIS — D509 Iron deficiency anemia, unspecified: Secondary | ICD-10-CM | POA: Diagnosis present

## 2015-02-15 DIAGNOSIS — K632 Fistula of intestine: Secondary | ICD-10-CM | POA: Diagnosis present

## 2015-02-15 DIAGNOSIS — I96 Gangrene, not elsewhere classified: Secondary | ICD-10-CM | POA: Diagnosis present

## 2015-02-15 DIAGNOSIS — I739 Peripheral vascular disease, unspecified: Secondary | ICD-10-CM | POA: Diagnosis present

## 2015-02-15 DIAGNOSIS — A047 Enterocolitis due to Clostridium difficile: Secondary | ICD-10-CM | POA: Diagnosis present

## 2015-02-15 DIAGNOSIS — R197 Diarrhea, unspecified: Secondary | ICD-10-CM | POA: Diagnosis not present

## 2015-02-15 DIAGNOSIS — Z87898 Personal history of other specified conditions: Secondary | ICD-10-CM | POA: Diagnosis not present

## 2015-02-15 DIAGNOSIS — J90 Pleural effusion, not elsewhere classified: Secondary | ICD-10-CM | POA: Diagnosis present

## 2015-02-15 DIAGNOSIS — K651 Peritoneal abscess: Secondary | ICD-10-CM | POA: Diagnosis present

## 2015-02-15 DIAGNOSIS — D72829 Elevated white blood cell count, unspecified: Secondary | ICD-10-CM | POA: Diagnosis not present

## 2015-02-15 DIAGNOSIS — Z452 Encounter for adjustment and management of vascular access device: Secondary | ICD-10-CM | POA: Diagnosis not present

## 2015-02-15 DIAGNOSIS — A419 Sepsis, unspecified organism: Secondary | ICD-10-CM | POA: Diagnosis not present

## 2015-02-15 DIAGNOSIS — B9689 Other specified bacterial agents as the cause of diseases classified elsewhere: Secondary | ICD-10-CM | POA: Diagnosis present

## 2015-02-15 DIAGNOSIS — D649 Anemia, unspecified: Secondary | ICD-10-CM | POA: Diagnosis not present

## 2015-03-01 DIAGNOSIS — Z8719 Personal history of other diseases of the digestive system: Secondary | ICD-10-CM | POA: Diagnosis not present

## 2015-03-01 DIAGNOSIS — Z89611 Acquired absence of right leg above knee: Secondary | ICD-10-CM | POA: Diagnosis not present

## 2015-03-01 DIAGNOSIS — I739 Peripheral vascular disease, unspecified: Secondary | ICD-10-CM | POA: Diagnosis not present

## 2015-03-01 DIAGNOSIS — Z87898 Personal history of other specified conditions: Secondary | ICD-10-CM | POA: Diagnosis not present

## 2015-03-02 DIAGNOSIS — Z87898 Personal history of other specified conditions: Secondary | ICD-10-CM | POA: Diagnosis not present

## 2015-03-02 DIAGNOSIS — Z8719 Personal history of other diseases of the digestive system: Secondary | ICD-10-CM | POA: Diagnosis not present

## 2015-03-02 DIAGNOSIS — I739 Peripheral vascular disease, unspecified: Secondary | ICD-10-CM | POA: Diagnosis not present

## 2015-03-02 DIAGNOSIS — Z89611 Acquired absence of right leg above knee: Secondary | ICD-10-CM | POA: Diagnosis not present

## 2015-03-04 DIAGNOSIS — I739 Peripheral vascular disease, unspecified: Secondary | ICD-10-CM | POA: Diagnosis not present

## 2015-03-04 DIAGNOSIS — Z87898 Personal history of other specified conditions: Secondary | ICD-10-CM | POA: Diagnosis not present

## 2015-03-04 DIAGNOSIS — Z8719 Personal history of other diseases of the digestive system: Secondary | ICD-10-CM | POA: Diagnosis not present

## 2015-03-04 DIAGNOSIS — Z89611 Acquired absence of right leg above knee: Secondary | ICD-10-CM | POA: Diagnosis not present

## 2015-03-08 DIAGNOSIS — Z89611 Acquired absence of right leg above knee: Secondary | ICD-10-CM | POA: Diagnosis not present

## 2015-03-08 DIAGNOSIS — I739 Peripheral vascular disease, unspecified: Secondary | ICD-10-CM | POA: Diagnosis not present

## 2015-03-08 DIAGNOSIS — I96 Gangrene, not elsewhere classified: Secondary | ICD-10-CM | POA: Diagnosis not present

## 2015-03-08 DIAGNOSIS — Z87898 Personal history of other specified conditions: Secondary | ICD-10-CM | POA: Diagnosis not present

## 2015-03-09 DIAGNOSIS — Z87898 Personal history of other specified conditions: Secondary | ICD-10-CM | POA: Diagnosis not present

## 2015-03-09 DIAGNOSIS — D259 Leiomyoma of uterus, unspecified: Secondary | ICD-10-CM | POA: Diagnosis not present

## 2015-03-09 DIAGNOSIS — N322 Vesical fistula, not elsewhere classified: Secondary | ICD-10-CM | POA: Diagnosis not present

## 2015-03-09 DIAGNOSIS — Z89611 Acquired absence of right leg above knee: Secondary | ICD-10-CM | POA: Diagnosis not present

## 2015-03-09 DIAGNOSIS — I739 Peripheral vascular disease, unspecified: Secondary | ICD-10-CM | POA: Diagnosis not present

## 2015-03-09 DIAGNOSIS — K8020 Calculus of gallbladder without cholecystitis without obstruction: Secondary | ICD-10-CM | POA: Diagnosis not present

## 2015-03-09 DIAGNOSIS — Z8719 Personal history of other diseases of the digestive system: Secondary | ICD-10-CM | POA: Diagnosis not present

## 2015-03-09 DIAGNOSIS — K632 Fistula of intestine: Secondary | ICD-10-CM | POA: Diagnosis not present

## 2015-03-10 DIAGNOSIS — R5381 Other malaise: Secondary | ICD-10-CM | POA: Diagnosis not present

## 2015-03-10 DIAGNOSIS — Z87898 Personal history of other specified conditions: Secondary | ICD-10-CM | POA: Diagnosis not present

## 2015-03-10 DIAGNOSIS — R1012 Left upper quadrant pain: Secondary | ICD-10-CM | POA: Diagnosis not present

## 2015-03-10 DIAGNOSIS — M79672 Pain in left foot: Secondary | ICD-10-CM | POA: Diagnosis not present

## 2015-03-11 DIAGNOSIS — K651 Peritoneal abscess: Secondary | ICD-10-CM | POA: Diagnosis not present

## 2015-03-11 DIAGNOSIS — I96 Gangrene, not elsewhere classified: Secondary | ICD-10-CM | POA: Diagnosis not present

## 2015-03-11 DIAGNOSIS — Z89611 Acquired absence of right leg above knee: Secondary | ICD-10-CM | POA: Diagnosis not present

## 2015-03-14 DIAGNOSIS — I96 Gangrene, not elsewhere classified: Secondary | ICD-10-CM | POA: Diagnosis not present

## 2015-03-14 DIAGNOSIS — Z89611 Acquired absence of right leg above knee: Secondary | ICD-10-CM | POA: Diagnosis not present

## 2015-03-14 DIAGNOSIS — Z87898 Personal history of other specified conditions: Secondary | ICD-10-CM | POA: Diagnosis not present

## 2015-03-15 DIAGNOSIS — Z87898 Personal history of other specified conditions: Secondary | ICD-10-CM | POA: Diagnosis not present

## 2015-03-15 DIAGNOSIS — I96 Gangrene, not elsewhere classified: Secondary | ICD-10-CM | POA: Diagnosis not present

## 2015-03-15 DIAGNOSIS — Z89611 Acquired absence of right leg above knee: Secondary | ICD-10-CM | POA: Diagnosis not present

## 2015-03-16 DIAGNOSIS — I739 Peripheral vascular disease, unspecified: Secondary | ICD-10-CM | POA: Diagnosis not present

## 2015-03-16 DIAGNOSIS — Z89611 Acquired absence of right leg above knee: Secondary | ICD-10-CM | POA: Diagnosis not present

## 2015-03-16 DIAGNOSIS — Z87898 Personal history of other specified conditions: Secondary | ICD-10-CM | POA: Diagnosis not present

## 2015-03-16 DIAGNOSIS — Z8719 Personal history of other diseases of the digestive system: Secondary | ICD-10-CM | POA: Diagnosis not present

## 2015-03-21 DIAGNOSIS — M79672 Pain in left foot: Secondary | ICD-10-CM | POA: Diagnosis not present

## 2015-03-21 DIAGNOSIS — Z89611 Acquired absence of right leg above knee: Secondary | ICD-10-CM | POA: Diagnosis not present

## 2015-03-21 DIAGNOSIS — Z87898 Personal history of other specified conditions: Secondary | ICD-10-CM | POA: Diagnosis not present

## 2015-03-21 DIAGNOSIS — I739 Peripheral vascular disease, unspecified: Secondary | ICD-10-CM | POA: Diagnosis not present

## 2015-03-22 DIAGNOSIS — Z87898 Personal history of other specified conditions: Secondary | ICD-10-CM | POA: Diagnosis not present

## 2015-03-22 DIAGNOSIS — I70268 Atherosclerosis of native arteries of extremities with gangrene, other extremity: Secondary | ICD-10-CM | POA: Diagnosis not present

## 2015-03-22 DIAGNOSIS — Z89611 Acquired absence of right leg above knee: Secondary | ICD-10-CM | POA: Diagnosis not present

## 2015-03-22 DIAGNOSIS — L98492 Non-pressure chronic ulcer of skin of other sites with fat layer exposed: Secondary | ICD-10-CM | POA: Diagnosis not present

## 2015-03-22 DIAGNOSIS — I739 Peripheral vascular disease, unspecified: Secondary | ICD-10-CM | POA: Diagnosis not present

## 2015-03-22 DIAGNOSIS — M79672 Pain in left foot: Secondary | ICD-10-CM | POA: Diagnosis not present

## 2015-03-23 DIAGNOSIS — I70262 Atherosclerosis of native arteries of extremities with gangrene, left leg: Secondary | ICD-10-CM | POA: Diagnosis not present

## 2015-03-23 DIAGNOSIS — I739 Peripheral vascular disease, unspecified: Secondary | ICD-10-CM | POA: Diagnosis not present

## 2015-03-23 DIAGNOSIS — Z87898 Personal history of other specified conditions: Secondary | ICD-10-CM | POA: Diagnosis not present

## 2015-03-23 DIAGNOSIS — Z89611 Acquired absence of right leg above knee: Secondary | ICD-10-CM | POA: Diagnosis not present

## 2015-03-23 DIAGNOSIS — M79672 Pain in left foot: Secondary | ICD-10-CM | POA: Diagnosis not present

## 2015-03-24 DIAGNOSIS — Z87898 Personal history of other specified conditions: Secondary | ICD-10-CM | POA: Diagnosis not present

## 2015-03-24 DIAGNOSIS — M79672 Pain in left foot: Secondary | ICD-10-CM | POA: Diagnosis not present

## 2015-03-24 DIAGNOSIS — Z89611 Acquired absence of right leg above knee: Secondary | ICD-10-CM | POA: Diagnosis not present

## 2015-03-24 DIAGNOSIS — I739 Peripheral vascular disease, unspecified: Secondary | ICD-10-CM | POA: Diagnosis not present

## 2015-03-25 DIAGNOSIS — Z87898 Personal history of other specified conditions: Secondary | ICD-10-CM | POA: Diagnosis not present

## 2015-03-25 DIAGNOSIS — I739 Peripheral vascular disease, unspecified: Secondary | ICD-10-CM | POA: Diagnosis not present

## 2015-03-25 DIAGNOSIS — M79672 Pain in left foot: Secondary | ICD-10-CM | POA: Diagnosis not present

## 2015-03-25 DIAGNOSIS — Z89611 Acquired absence of right leg above knee: Secondary | ICD-10-CM | POA: Diagnosis not present

## 2015-03-25 DIAGNOSIS — Z4889 Encounter for other specified surgical aftercare: Secondary | ICD-10-CM | POA: Diagnosis not present

## 2015-03-29 DIAGNOSIS — M79672 Pain in left foot: Secondary | ICD-10-CM | POA: Diagnosis not present

## 2015-03-29 DIAGNOSIS — Z89611 Acquired absence of right leg above knee: Secondary | ICD-10-CM | POA: Diagnosis not present

## 2015-03-29 DIAGNOSIS — I96 Gangrene, not elsewhere classified: Secondary | ICD-10-CM | POA: Diagnosis not present

## 2015-03-29 DIAGNOSIS — I739 Peripheral vascular disease, unspecified: Secondary | ICD-10-CM | POA: Diagnosis not present

## 2015-03-30 DIAGNOSIS — Z89611 Acquired absence of right leg above knee: Secondary | ICD-10-CM | POA: Diagnosis not present

## 2015-03-30 DIAGNOSIS — M79672 Pain in left foot: Secondary | ICD-10-CM | POA: Diagnosis not present

## 2015-03-30 DIAGNOSIS — I739 Peripheral vascular disease, unspecified: Secondary | ICD-10-CM | POA: Diagnosis not present

## 2015-03-30 DIAGNOSIS — Z87898 Personal history of other specified conditions: Secondary | ICD-10-CM | POA: Diagnosis not present

## 2015-03-31 DIAGNOSIS — K8080 Other cholelithiasis without obstruction: Secondary | ICD-10-CM | POA: Diagnosis not present

## 2015-03-31 DIAGNOSIS — D259 Leiomyoma of uterus, unspecified: Secondary | ICD-10-CM | POA: Diagnosis not present

## 2015-03-31 DIAGNOSIS — E279 Disorder of adrenal gland, unspecified: Secondary | ICD-10-CM | POA: Diagnosis not present

## 2015-03-31 DIAGNOSIS — K8020 Calculus of gallbladder without cholecystitis without obstruction: Secondary | ICD-10-CM | POA: Diagnosis not present

## 2015-04-01 DIAGNOSIS — I739 Peripheral vascular disease, unspecified: Secondary | ICD-10-CM | POA: Diagnosis not present

## 2015-04-01 DIAGNOSIS — Z87898 Personal history of other specified conditions: Secondary | ICD-10-CM | POA: Diagnosis not present

## 2015-04-01 DIAGNOSIS — Z89611 Acquired absence of right leg above knee: Secondary | ICD-10-CM | POA: Diagnosis not present

## 2015-04-01 DIAGNOSIS — M79672 Pain in left foot: Secondary | ICD-10-CM | POA: Diagnosis not present

## 2015-04-05 DIAGNOSIS — Z89611 Acquired absence of right leg above knee: Secondary | ICD-10-CM | POA: Diagnosis not present

## 2015-04-05 DIAGNOSIS — I739 Peripheral vascular disease, unspecified: Secondary | ICD-10-CM | POA: Diagnosis not present

## 2015-04-05 DIAGNOSIS — K651 Peritoneal abscess: Secondary | ICD-10-CM | POA: Diagnosis not present

## 2015-04-05 DIAGNOSIS — I96 Gangrene, not elsewhere classified: Secondary | ICD-10-CM | POA: Diagnosis not present

## 2015-04-06 DIAGNOSIS — Z87898 Personal history of other specified conditions: Secondary | ICD-10-CM | POA: Diagnosis not present

## 2015-04-06 DIAGNOSIS — Z89611 Acquired absence of right leg above knee: Secondary | ICD-10-CM | POA: Diagnosis not present

## 2015-04-06 DIAGNOSIS — I739 Peripheral vascular disease, unspecified: Secondary | ICD-10-CM | POA: Diagnosis not present

## 2015-04-06 DIAGNOSIS — M79672 Pain in left foot: Secondary | ICD-10-CM | POA: Diagnosis not present

## 2015-04-07 DIAGNOSIS — Z89611 Acquired absence of right leg above knee: Secondary | ICD-10-CM | POA: Diagnosis not present

## 2015-04-07 DIAGNOSIS — Z87898 Personal history of other specified conditions: Secondary | ICD-10-CM | POA: Diagnosis not present

## 2015-04-07 DIAGNOSIS — M79672 Pain in left foot: Secondary | ICD-10-CM | POA: Diagnosis not present

## 2015-04-11 DIAGNOSIS — M79672 Pain in left foot: Secondary | ICD-10-CM | POA: Diagnosis not present

## 2015-04-11 DIAGNOSIS — Z87898 Personal history of other specified conditions: Secondary | ICD-10-CM | POA: Diagnosis not present

## 2015-04-11 DIAGNOSIS — I96 Gangrene, not elsewhere classified: Secondary | ICD-10-CM | POA: Diagnosis not present

## 2015-04-12 DIAGNOSIS — I739 Peripheral vascular disease, unspecified: Secondary | ICD-10-CM | POA: Diagnosis not present

## 2015-04-12 DIAGNOSIS — Z89611 Acquired absence of right leg above knee: Secondary | ICD-10-CM | POA: Diagnosis not present

## 2015-04-13 DIAGNOSIS — I739 Peripheral vascular disease, unspecified: Secondary | ICD-10-CM | POA: Diagnosis not present

## 2015-04-13 DIAGNOSIS — M79672 Pain in left foot: Secondary | ICD-10-CM | POA: Diagnosis not present

## 2015-04-13 DIAGNOSIS — Z87898 Personal history of other specified conditions: Secondary | ICD-10-CM | POA: Diagnosis not present

## 2015-04-13 DIAGNOSIS — I96 Gangrene, not elsewhere classified: Secondary | ICD-10-CM | POA: Diagnosis not present

## 2015-04-14 DIAGNOSIS — I96 Gangrene, not elsewhere classified: Secondary | ICD-10-CM | POA: Diagnosis not present

## 2015-04-14 DIAGNOSIS — M79672 Pain in left foot: Secondary | ICD-10-CM | POA: Diagnosis not present

## 2015-04-14 DIAGNOSIS — R1904 Left lower quadrant abdominal swelling, mass and lump: Secondary | ICD-10-CM | POA: Diagnosis not present

## 2015-04-15 DIAGNOSIS — M2012 Hallux valgus (acquired), left foot: Secondary | ICD-10-CM | POA: Diagnosis not present

## 2015-04-15 DIAGNOSIS — M79672 Pain in left foot: Secondary | ICD-10-CM | POA: Diagnosis not present

## 2015-04-19 DIAGNOSIS — K651 Peritoneal abscess: Secondary | ICD-10-CM | POA: Diagnosis not present

## 2015-04-20 DIAGNOSIS — I96 Gangrene, not elsewhere classified: Secondary | ICD-10-CM | POA: Diagnosis not present

## 2015-04-20 DIAGNOSIS — R1904 Left lower quadrant abdominal swelling, mass and lump: Secondary | ICD-10-CM | POA: Diagnosis not present

## 2015-04-20 DIAGNOSIS — I739 Peripheral vascular disease, unspecified: Secondary | ICD-10-CM | POA: Diagnosis not present

## 2015-04-20 DIAGNOSIS — M79672 Pain in left foot: Secondary | ICD-10-CM | POA: Diagnosis not present

## 2015-04-21 DIAGNOSIS — Z48812 Encounter for surgical aftercare following surgery on the circulatory system: Secondary | ICD-10-CM | POA: Diagnosis not present

## 2015-04-21 DIAGNOSIS — K632 Fistula of intestine: Secondary | ICD-10-CM | POA: Diagnosis not present

## 2015-04-21 DIAGNOSIS — I700 Atherosclerosis of aorta: Secondary | ICD-10-CM | POA: Diagnosis not present

## 2015-04-21 DIAGNOSIS — M25579 Pain in unspecified ankle and joints of unspecified foot: Secondary | ICD-10-CM | POA: Diagnosis not present

## 2015-04-21 DIAGNOSIS — M79672 Pain in left foot: Secondary | ICD-10-CM | POA: Diagnosis not present

## 2015-04-21 DIAGNOSIS — N390 Urinary tract infection, site not specified: Secondary | ICD-10-CM | POA: Diagnosis not present

## 2015-04-21 DIAGNOSIS — Z89611 Acquired absence of right leg above knee: Secondary | ICD-10-CM | POA: Diagnosis not present

## 2015-04-21 DIAGNOSIS — K651 Peritoneal abscess: Secondary | ICD-10-CM | POA: Diagnosis not present

## 2015-04-21 DIAGNOSIS — Z89519 Acquired absence of unspecified leg below knee: Secondary | ICD-10-CM | POA: Diagnosis not present

## 2015-04-21 DIAGNOSIS — M86172 Other acute osteomyelitis, left ankle and foot: Secondary | ICD-10-CM | POA: Diagnosis not present

## 2015-04-21 DIAGNOSIS — I10 Essential (primary) hypertension: Secondary | ICD-10-CM | POA: Diagnosis not present

## 2015-04-21 DIAGNOSIS — Z1635 Resistance to multiple antimicrobial drugs: Secondary | ICD-10-CM | POA: Diagnosis not present

## 2015-04-21 DIAGNOSIS — J449 Chronic obstructive pulmonary disease, unspecified: Secondary | ICD-10-CM | POA: Diagnosis present

## 2015-04-21 DIAGNOSIS — K219 Gastro-esophageal reflux disease without esophagitis: Secondary | ICD-10-CM | POA: Diagnosis present

## 2015-04-21 DIAGNOSIS — I771 Stricture of artery: Secondary | ICD-10-CM | POA: Diagnosis present

## 2015-04-21 DIAGNOSIS — M8618 Other acute osteomyelitis, other site: Secondary | ICD-10-CM | POA: Diagnosis not present

## 2015-04-21 DIAGNOSIS — I96 Gangrene, not elsewhere classified: Secondary | ICD-10-CM | POA: Diagnosis not present

## 2015-04-21 DIAGNOSIS — Z01818 Encounter for other preprocedural examination: Secondary | ICD-10-CM | POA: Diagnosis not present

## 2015-04-21 DIAGNOSIS — Z87898 Personal history of other specified conditions: Secondary | ICD-10-CM | POA: Diagnosis not present

## 2015-04-21 DIAGNOSIS — I998 Other disorder of circulatory system: Secondary | ICD-10-CM | POA: Diagnosis not present

## 2015-04-21 DIAGNOSIS — Z87891 Personal history of nicotine dependence: Secondary | ICD-10-CM | POA: Diagnosis not present

## 2015-04-21 DIAGNOSIS — Z7902 Long term (current) use of antithrombotics/antiplatelets: Secondary | ICD-10-CM | POA: Diagnosis not present

## 2015-04-21 DIAGNOSIS — L97429 Non-pressure chronic ulcer of left heel and midfoot with unspecified severity: Secondary | ICD-10-CM | POA: Diagnosis not present

## 2015-04-21 DIAGNOSIS — I749 Embolism and thrombosis of unspecified artery: Secondary | ICD-10-CM | POA: Diagnosis not present

## 2015-04-21 DIAGNOSIS — I739 Peripheral vascular disease, unspecified: Secondary | ICD-10-CM | POA: Diagnosis not present

## 2015-04-21 DIAGNOSIS — I7092 Chronic total occlusion of artery of the extremities: Secondary | ICD-10-CM | POA: Diagnosis not present

## 2015-04-21 DIAGNOSIS — Z01810 Encounter for preprocedural cardiovascular examination: Secondary | ICD-10-CM | POA: Diagnosis not present

## 2015-04-21 DIAGNOSIS — I743 Embolism and thrombosis of arteries of the lower extremities: Secondary | ICD-10-CM | POA: Diagnosis not present

## 2015-04-21 DIAGNOSIS — I70262 Atherosclerosis of native arteries of extremities with gangrene, left leg: Secondary | ICD-10-CM | POA: Diagnosis present

## 2015-05-11 DIAGNOSIS — I739 Peripheral vascular disease, unspecified: Secondary | ICD-10-CM | POA: Diagnosis not present

## 2015-05-11 DIAGNOSIS — K651 Peritoneal abscess: Secondary | ICD-10-CM | POA: Diagnosis not present

## 2015-05-11 DIAGNOSIS — I96 Gangrene, not elsewhere classified: Secondary | ICD-10-CM | POA: Diagnosis not present

## 2015-05-11 DIAGNOSIS — Z89612 Acquired absence of left leg above knee: Secondary | ICD-10-CM | POA: Diagnosis not present

## 2015-05-12 DIAGNOSIS — N390 Urinary tract infection, site not specified: Secondary | ICD-10-CM | POA: Diagnosis not present

## 2015-05-12 DIAGNOSIS — A499 Bacterial infection, unspecified: Secondary | ICD-10-CM | POA: Diagnosis not present

## 2015-05-12 DIAGNOSIS — Z89612 Acquired absence of left leg above knee: Secondary | ICD-10-CM | POA: Diagnosis not present

## 2015-05-12 DIAGNOSIS — G8918 Other acute postprocedural pain: Secondary | ICD-10-CM | POA: Diagnosis not present

## 2015-05-17 DIAGNOSIS — Z89612 Acquired absence of left leg above knee: Secondary | ICD-10-CM | POA: Diagnosis not present

## 2015-05-17 DIAGNOSIS — Z9889 Other specified postprocedural states: Secondary | ICD-10-CM | POA: Diagnosis not present

## 2015-05-17 DIAGNOSIS — Z87898 Personal history of other specified conditions: Secondary | ICD-10-CM | POA: Diagnosis not present

## 2015-05-17 DIAGNOSIS — A499 Bacterial infection, unspecified: Secondary | ICD-10-CM | POA: Diagnosis not present

## 2015-05-17 DIAGNOSIS — N390 Urinary tract infection, site not specified: Secondary | ICD-10-CM | POA: Diagnosis not present

## 2015-05-18 DIAGNOSIS — A499 Bacterial infection, unspecified: Secondary | ICD-10-CM | POA: Diagnosis not present

## 2015-05-18 DIAGNOSIS — R109 Unspecified abdominal pain: Secondary | ICD-10-CM | POA: Diagnosis not present

## 2015-05-18 DIAGNOSIS — A419 Sepsis, unspecified organism: Secondary | ICD-10-CM | POA: Diagnosis not present

## 2015-05-18 DIAGNOSIS — N390 Urinary tract infection, site not specified: Secondary | ICD-10-CM | POA: Diagnosis not present

## 2015-05-18 DIAGNOSIS — R000 Tachycardia, unspecified: Secondary | ICD-10-CM | POA: Diagnosis not present

## 2015-05-20 DIAGNOSIS — A419 Sepsis, unspecified organism: Secondary | ICD-10-CM | POA: Diagnosis not present

## 2015-05-20 DIAGNOSIS — N390 Urinary tract infection, site not specified: Secondary | ICD-10-CM | POA: Diagnosis not present

## 2015-05-20 DIAGNOSIS — R000 Tachycardia, unspecified: Secondary | ICD-10-CM | POA: Diagnosis not present

## 2015-05-20 DIAGNOSIS — A499 Bacterial infection, unspecified: Secondary | ICD-10-CM | POA: Diagnosis not present

## 2015-05-25 DIAGNOSIS — N390 Urinary tract infection, site not specified: Secondary | ICD-10-CM | POA: Diagnosis not present

## 2015-05-25 DIAGNOSIS — Z89612 Acquired absence of left leg above knee: Secondary | ICD-10-CM | POA: Diagnosis not present

## 2015-05-25 DIAGNOSIS — R000 Tachycardia, unspecified: Secondary | ICD-10-CM | POA: Diagnosis not present

## 2015-05-25 DIAGNOSIS — A499 Bacterial infection, unspecified: Secondary | ICD-10-CM | POA: Diagnosis not present

## 2015-06-07 DIAGNOSIS — R5381 Other malaise: Secondary | ICD-10-CM | POA: Diagnosis not present

## 2015-06-07 DIAGNOSIS — J449 Chronic obstructive pulmonary disease, unspecified: Secondary | ICD-10-CM | POA: Diagnosis not present

## 2015-06-07 DIAGNOSIS — D649 Anemia, unspecified: Secondary | ICD-10-CM | POA: Diagnosis not present

## 2015-06-07 DIAGNOSIS — I10 Essential (primary) hypertension: Secondary | ICD-10-CM | POA: Diagnosis not present

## 2015-06-08 DIAGNOSIS — I10 Essential (primary) hypertension: Secondary | ICD-10-CM | POA: Diagnosis not present

## 2015-06-08 DIAGNOSIS — R5381 Other malaise: Secondary | ICD-10-CM | POA: Diagnosis not present

## 2015-06-08 DIAGNOSIS — D649 Anemia, unspecified: Secondary | ICD-10-CM | POA: Diagnosis not present

## 2015-06-08 DIAGNOSIS — J449 Chronic obstructive pulmonary disease, unspecified: Secondary | ICD-10-CM | POA: Diagnosis not present

## 2015-06-10 DIAGNOSIS — D649 Anemia, unspecified: Secondary | ICD-10-CM | POA: Diagnosis not present

## 2015-06-10 DIAGNOSIS — J449 Chronic obstructive pulmonary disease, unspecified: Secondary | ICD-10-CM | POA: Diagnosis not present

## 2015-06-10 DIAGNOSIS — I10 Essential (primary) hypertension: Secondary | ICD-10-CM | POA: Diagnosis not present

## 2015-06-10 DIAGNOSIS — R5381 Other malaise: Secondary | ICD-10-CM | POA: Diagnosis not present

## 2015-07-07 DIAGNOSIS — Z681 Body mass index (BMI) 19 or less, adult: Secondary | ICD-10-CM | POA: Diagnosis not present

## 2015-07-07 DIAGNOSIS — K219 Gastro-esophageal reflux disease without esophagitis: Secondary | ICD-10-CM | POA: Diagnosis not present

## 2015-07-07 DIAGNOSIS — I739 Peripheral vascular disease, unspecified: Secondary | ICD-10-CM | POA: Diagnosis not present

## 2015-07-07 DIAGNOSIS — Z89612 Acquired absence of left leg above knee: Secondary | ICD-10-CM | POA: Diagnosis not present

## 2015-07-10 DIAGNOSIS — I10 Essential (primary) hypertension: Secondary | ICD-10-CM | POA: Diagnosis not present

## 2015-07-10 DIAGNOSIS — Z89612 Acquired absence of left leg above knee: Secondary | ICD-10-CM | POA: Diagnosis not present

## 2015-07-10 DIAGNOSIS — Z44112 Encounter for fitting and adjustment of complete left artificial leg: Secondary | ICD-10-CM | POA: Diagnosis not present

## 2015-07-10 DIAGNOSIS — G546 Phantom limb syndrome with pain: Secondary | ICD-10-CM | POA: Diagnosis not present

## 2015-07-10 DIAGNOSIS — Z44111 Encounter for fitting and adjustment of complete right artificial leg: Secondary | ICD-10-CM | POA: Diagnosis not present

## 2015-07-10 DIAGNOSIS — Z89611 Acquired absence of right leg above knee: Secondary | ICD-10-CM | POA: Diagnosis not present

## 2015-07-11 DIAGNOSIS — Z89611 Acquired absence of right leg above knee: Secondary | ICD-10-CM | POA: Diagnosis not present

## 2015-07-11 DIAGNOSIS — Z44112 Encounter for fitting and adjustment of complete left artificial leg: Secondary | ICD-10-CM | POA: Diagnosis not present

## 2015-07-11 DIAGNOSIS — Z89612 Acquired absence of left leg above knee: Secondary | ICD-10-CM | POA: Diagnosis not present

## 2015-07-11 DIAGNOSIS — Z44111 Encounter for fitting and adjustment of complete right artificial leg: Secondary | ICD-10-CM | POA: Diagnosis not present

## 2015-07-11 DIAGNOSIS — I10 Essential (primary) hypertension: Secondary | ICD-10-CM | POA: Diagnosis not present

## 2015-07-11 DIAGNOSIS — G546 Phantom limb syndrome with pain: Secondary | ICD-10-CM | POA: Diagnosis not present

## 2015-07-12 DIAGNOSIS — Z89612 Acquired absence of left leg above knee: Secondary | ICD-10-CM | POA: Diagnosis not present

## 2015-07-12 DIAGNOSIS — Z44112 Encounter for fitting and adjustment of complete left artificial leg: Secondary | ICD-10-CM | POA: Diagnosis not present

## 2015-07-12 DIAGNOSIS — Z89611 Acquired absence of right leg above knee: Secondary | ICD-10-CM | POA: Diagnosis not present

## 2015-07-12 DIAGNOSIS — G546 Phantom limb syndrome with pain: Secondary | ICD-10-CM | POA: Diagnosis not present

## 2015-07-12 DIAGNOSIS — I10 Essential (primary) hypertension: Secondary | ICD-10-CM | POA: Diagnosis not present

## 2015-07-12 DIAGNOSIS — Z44111 Encounter for fitting and adjustment of complete right artificial leg: Secondary | ICD-10-CM | POA: Diagnosis not present

## 2015-07-14 DIAGNOSIS — G546 Phantom limb syndrome with pain: Secondary | ICD-10-CM | POA: Diagnosis not present

## 2015-07-14 DIAGNOSIS — I10 Essential (primary) hypertension: Secondary | ICD-10-CM | POA: Diagnosis not present

## 2015-07-14 DIAGNOSIS — Z44111 Encounter for fitting and adjustment of complete right artificial leg: Secondary | ICD-10-CM | POA: Diagnosis not present

## 2015-07-14 DIAGNOSIS — Z89612 Acquired absence of left leg above knee: Secondary | ICD-10-CM | POA: Diagnosis not present

## 2015-07-14 DIAGNOSIS — Z44112 Encounter for fitting and adjustment of complete left artificial leg: Secondary | ICD-10-CM | POA: Diagnosis not present

## 2015-07-14 DIAGNOSIS — Z89611 Acquired absence of right leg above knee: Secondary | ICD-10-CM | POA: Diagnosis not present

## 2015-07-29 DIAGNOSIS — Z89611 Acquired absence of right leg above knee: Secondary | ICD-10-CM | POA: Diagnosis not present

## 2015-07-29 DIAGNOSIS — Z44112 Encounter for fitting and adjustment of complete left artificial leg: Secondary | ICD-10-CM | POA: Diagnosis not present

## 2015-07-29 DIAGNOSIS — I10 Essential (primary) hypertension: Secondary | ICD-10-CM | POA: Diagnosis not present

## 2015-07-29 DIAGNOSIS — Z89612 Acquired absence of left leg above knee: Secondary | ICD-10-CM | POA: Diagnosis not present

## 2015-07-29 DIAGNOSIS — Z44111 Encounter for fitting and adjustment of complete right artificial leg: Secondary | ICD-10-CM | POA: Diagnosis not present

## 2015-07-29 DIAGNOSIS — G546 Phantom limb syndrome with pain: Secondary | ICD-10-CM | POA: Diagnosis not present

## 2015-08-23 DIAGNOSIS — I10 Essential (primary) hypertension: Secondary | ICD-10-CM | POA: Diagnosis not present

## 2015-08-23 DIAGNOSIS — D649 Anemia, unspecified: Secondary | ICD-10-CM | POA: Diagnosis not present

## 2015-08-23 DIAGNOSIS — Z89612 Acquired absence of left leg above knee: Secondary | ICD-10-CM | POA: Diagnosis not present

## 2015-09-30 DIAGNOSIS — A088 Other specified intestinal infections: Secondary | ICD-10-CM | POA: Diagnosis not present

## 2015-10-07 DIAGNOSIS — R748 Abnormal levels of other serum enzymes: Secondary | ICD-10-CM | POA: Diagnosis not present

## 2015-11-10 DIAGNOSIS — R262 Difficulty in walking, not elsewhere classified: Secondary | ICD-10-CM | POA: Diagnosis not present

## 2015-11-10 DIAGNOSIS — Z89612 Acquired absence of left leg above knee: Secondary | ICD-10-CM | POA: Diagnosis not present

## 2015-11-10 DIAGNOSIS — Z1389 Encounter for screening for other disorder: Secondary | ICD-10-CM | POA: Diagnosis not present

## 2015-11-10 DIAGNOSIS — Z993 Dependence on wheelchair: Secondary | ICD-10-CM | POA: Diagnosis not present

## 2015-11-10 DIAGNOSIS — G8929 Other chronic pain: Secondary | ICD-10-CM | POA: Diagnosis not present

## 2016-02-08 DIAGNOSIS — H3589 Other specified retinal disorders: Secondary | ICD-10-CM | POA: Diagnosis not present

## 2016-02-08 DIAGNOSIS — H47233 Glaucomatous optic atrophy, bilateral: Secondary | ICD-10-CM | POA: Diagnosis not present

## 2016-02-08 DIAGNOSIS — H43813 Vitreous degeneration, bilateral: Secondary | ICD-10-CM | POA: Diagnosis not present

## 2016-02-08 DIAGNOSIS — H35373 Puckering of macula, bilateral: Secondary | ICD-10-CM | POA: Diagnosis not present

## 2016-02-08 DIAGNOSIS — H524 Presbyopia: Secondary | ICD-10-CM | POA: Diagnosis not present

## 2016-02-08 DIAGNOSIS — H25813 Combined forms of age-related cataract, bilateral: Secondary | ICD-10-CM | POA: Diagnosis not present

## 2016-02-08 DIAGNOSIS — H43393 Other vitreous opacities, bilateral: Secondary | ICD-10-CM | POA: Diagnosis not present

## 2016-02-08 DIAGNOSIS — H52223 Regular astigmatism, bilateral: Secondary | ICD-10-CM | POA: Diagnosis not present

## 2016-02-08 DIAGNOSIS — H43313 Vitreous membranes and strands, bilateral: Secondary | ICD-10-CM | POA: Diagnosis not present

## 2016-02-08 DIAGNOSIS — H5203 Hypermetropia, bilateral: Secondary | ICD-10-CM | POA: Diagnosis not present

## 2016-02-08 DIAGNOSIS — H40013 Open angle with borderline findings, low risk, bilateral: Secondary | ICD-10-CM | POA: Diagnosis not present

## 2016-02-23 DIAGNOSIS — Z89612 Acquired absence of left leg above knee: Secondary | ICD-10-CM | POA: Diagnosis not present

## 2016-02-23 DIAGNOSIS — Z89611 Acquired absence of right leg above knee: Secondary | ICD-10-CM | POA: Diagnosis not present

## 2016-02-23 DIAGNOSIS — R262 Difficulty in walking, not elsewhere classified: Secondary | ICD-10-CM | POA: Diagnosis not present

## 2016-02-23 DIAGNOSIS — M6281 Muscle weakness (generalized): Secondary | ICD-10-CM | POA: Diagnosis not present

## 2016-02-28 DIAGNOSIS — Z89611 Acquired absence of right leg above knee: Secondary | ICD-10-CM | POA: Diagnosis not present

## 2016-02-28 DIAGNOSIS — R262 Difficulty in walking, not elsewhere classified: Secondary | ICD-10-CM | POA: Diagnosis not present

## 2016-02-28 DIAGNOSIS — M6281 Muscle weakness (generalized): Secondary | ICD-10-CM | POA: Diagnosis not present

## 2016-02-28 DIAGNOSIS — Z89612 Acquired absence of left leg above knee: Secondary | ICD-10-CM | POA: Diagnosis not present

## 2016-03-02 DIAGNOSIS — M6281 Muscle weakness (generalized): Secondary | ICD-10-CM | POA: Diagnosis not present

## 2016-03-02 DIAGNOSIS — Z89611 Acquired absence of right leg above knee: Secondary | ICD-10-CM | POA: Diagnosis not present

## 2016-03-02 DIAGNOSIS — R262 Difficulty in walking, not elsewhere classified: Secondary | ICD-10-CM | POA: Diagnosis not present

## 2016-03-02 DIAGNOSIS — Z89612 Acquired absence of left leg above knee: Secondary | ICD-10-CM | POA: Diagnosis not present

## 2016-03-05 DIAGNOSIS — Z89612 Acquired absence of left leg above knee: Secondary | ICD-10-CM | POA: Diagnosis not present

## 2016-03-05 DIAGNOSIS — M6281 Muscle weakness (generalized): Secondary | ICD-10-CM | POA: Diagnosis not present

## 2016-03-05 DIAGNOSIS — R262 Difficulty in walking, not elsewhere classified: Secondary | ICD-10-CM | POA: Diagnosis not present

## 2016-03-05 DIAGNOSIS — Z89611 Acquired absence of right leg above knee: Secondary | ICD-10-CM | POA: Diagnosis not present

## 2016-03-08 DIAGNOSIS — H40013 Open angle with borderline findings, low risk, bilateral: Secondary | ICD-10-CM | POA: Diagnosis not present

## 2016-03-08 DIAGNOSIS — H47233 Glaucomatous optic atrophy, bilateral: Secondary | ICD-10-CM | POA: Diagnosis not present

## 2016-03-20 DIAGNOSIS — Z89612 Acquired absence of left leg above knee: Secondary | ICD-10-CM | POA: Diagnosis not present

## 2016-03-20 DIAGNOSIS — Z89611 Acquired absence of right leg above knee: Secondary | ICD-10-CM | POA: Diagnosis not present

## 2016-03-20 DIAGNOSIS — M6281 Muscle weakness (generalized): Secondary | ICD-10-CM | POA: Diagnosis not present

## 2016-03-20 DIAGNOSIS — R262 Difficulty in walking, not elsewhere classified: Secondary | ICD-10-CM | POA: Diagnosis not present

## 2016-03-22 DIAGNOSIS — R262 Difficulty in walking, not elsewhere classified: Secondary | ICD-10-CM | POA: Diagnosis not present

## 2016-03-22 DIAGNOSIS — Z89611 Acquired absence of right leg above knee: Secondary | ICD-10-CM | POA: Diagnosis not present

## 2016-03-22 DIAGNOSIS — Z89612 Acquired absence of left leg above knee: Secondary | ICD-10-CM | POA: Diagnosis not present

## 2016-03-22 DIAGNOSIS — M6281 Muscle weakness (generalized): Secondary | ICD-10-CM | POA: Diagnosis not present

## 2016-03-27 DIAGNOSIS — Z89611 Acquired absence of right leg above knee: Secondary | ICD-10-CM | POA: Diagnosis not present

## 2016-03-27 DIAGNOSIS — Z89612 Acquired absence of left leg above knee: Secondary | ICD-10-CM | POA: Diagnosis not present

## 2016-03-27 DIAGNOSIS — M6281 Muscle weakness (generalized): Secondary | ICD-10-CM | POA: Diagnosis not present

## 2016-03-27 DIAGNOSIS — R262 Difficulty in walking, not elsewhere classified: Secondary | ICD-10-CM | POA: Diagnosis not present

## 2016-03-29 DIAGNOSIS — Z89612 Acquired absence of left leg above knee: Secondary | ICD-10-CM | POA: Diagnosis not present

## 2016-03-29 DIAGNOSIS — R262 Difficulty in walking, not elsewhere classified: Secondary | ICD-10-CM | POA: Diagnosis not present

## 2016-03-29 DIAGNOSIS — M6281 Muscle weakness (generalized): Secondary | ICD-10-CM | POA: Diagnosis not present

## 2016-03-29 DIAGNOSIS — J45909 Unspecified asthma, uncomplicated: Secondary | ICD-10-CM | POA: Diagnosis not present

## 2016-03-29 DIAGNOSIS — Z89611 Acquired absence of right leg above knee: Secondary | ICD-10-CM | POA: Diagnosis not present

## 2016-03-29 DIAGNOSIS — Z681 Body mass index (BMI) 19 or less, adult: Secondary | ICD-10-CM | POA: Diagnosis not present

## 2016-04-03 DIAGNOSIS — Z89611 Acquired absence of right leg above knee: Secondary | ICD-10-CM | POA: Diagnosis not present

## 2016-04-03 DIAGNOSIS — M6281 Muscle weakness (generalized): Secondary | ICD-10-CM | POA: Diagnosis not present

## 2016-04-03 DIAGNOSIS — Z89612 Acquired absence of left leg above knee: Secondary | ICD-10-CM | POA: Diagnosis not present

## 2016-04-03 DIAGNOSIS — R262 Difficulty in walking, not elsewhere classified: Secondary | ICD-10-CM | POA: Diagnosis not present

## 2016-04-10 DIAGNOSIS — Z89612 Acquired absence of left leg above knee: Secondary | ICD-10-CM | POA: Diagnosis not present

## 2016-04-10 DIAGNOSIS — M6281 Muscle weakness (generalized): Secondary | ICD-10-CM | POA: Diagnosis not present

## 2016-04-10 DIAGNOSIS — R262 Difficulty in walking, not elsewhere classified: Secondary | ICD-10-CM | POA: Diagnosis not present

## 2016-04-10 DIAGNOSIS — Z89611 Acquired absence of right leg above knee: Secondary | ICD-10-CM | POA: Diagnosis not present

## 2016-04-12 DIAGNOSIS — R262 Difficulty in walking, not elsewhere classified: Secondary | ICD-10-CM | POA: Diagnosis not present

## 2016-04-12 DIAGNOSIS — Z89611 Acquired absence of right leg above knee: Secondary | ICD-10-CM | POA: Diagnosis not present

## 2016-04-12 DIAGNOSIS — M6281 Muscle weakness (generalized): Secondary | ICD-10-CM | POA: Diagnosis not present

## 2016-04-12 DIAGNOSIS — Z89612 Acquired absence of left leg above knee: Secondary | ICD-10-CM | POA: Diagnosis not present

## 2016-04-19 DIAGNOSIS — M6281 Muscle weakness (generalized): Secondary | ICD-10-CM | POA: Diagnosis not present

## 2016-04-19 DIAGNOSIS — R262 Difficulty in walking, not elsewhere classified: Secondary | ICD-10-CM | POA: Diagnosis not present

## 2016-04-19 DIAGNOSIS — Z89612 Acquired absence of left leg above knee: Secondary | ICD-10-CM | POA: Diagnosis not present

## 2016-04-19 DIAGNOSIS — Z89611 Acquired absence of right leg above knee: Secondary | ICD-10-CM | POA: Diagnosis not present

## 2016-04-24 DIAGNOSIS — M6281 Muscle weakness (generalized): Secondary | ICD-10-CM | POA: Diagnosis not present

## 2016-04-24 DIAGNOSIS — R262 Difficulty in walking, not elsewhere classified: Secondary | ICD-10-CM | POA: Diagnosis not present

## 2016-04-24 DIAGNOSIS — Z89612 Acquired absence of left leg above knee: Secondary | ICD-10-CM | POA: Diagnosis not present

## 2016-04-24 DIAGNOSIS — Z89611 Acquired absence of right leg above knee: Secondary | ICD-10-CM | POA: Diagnosis not present

## 2016-04-26 DIAGNOSIS — Z89612 Acquired absence of left leg above knee: Secondary | ICD-10-CM | POA: Diagnosis not present

## 2016-04-26 DIAGNOSIS — R262 Difficulty in walking, not elsewhere classified: Secondary | ICD-10-CM | POA: Diagnosis not present

## 2016-04-26 DIAGNOSIS — M6281 Muscle weakness (generalized): Secondary | ICD-10-CM | POA: Diagnosis not present

## 2016-04-26 DIAGNOSIS — Z89611 Acquired absence of right leg above knee: Secondary | ICD-10-CM | POA: Diagnosis not present

## 2016-05-01 DIAGNOSIS — R262 Difficulty in walking, not elsewhere classified: Secondary | ICD-10-CM | POA: Diagnosis not present

## 2016-05-01 DIAGNOSIS — Z89611 Acquired absence of right leg above knee: Secondary | ICD-10-CM | POA: Diagnosis not present

## 2016-05-01 DIAGNOSIS — M6281 Muscle weakness (generalized): Secondary | ICD-10-CM | POA: Diagnosis not present

## 2016-05-01 DIAGNOSIS — Z89612 Acquired absence of left leg above knee: Secondary | ICD-10-CM | POA: Diagnosis not present

## 2016-05-08 DIAGNOSIS — Z89611 Acquired absence of right leg above knee: Secondary | ICD-10-CM | POA: Diagnosis not present

## 2016-05-08 DIAGNOSIS — R262 Difficulty in walking, not elsewhere classified: Secondary | ICD-10-CM | POA: Diagnosis not present

## 2016-05-08 DIAGNOSIS — M6281 Muscle weakness (generalized): Secondary | ICD-10-CM | POA: Diagnosis not present

## 2016-05-08 DIAGNOSIS — Z89612 Acquired absence of left leg above knee: Secondary | ICD-10-CM | POA: Diagnosis not present

## 2016-05-15 DIAGNOSIS — R262 Difficulty in walking, not elsewhere classified: Secondary | ICD-10-CM | POA: Diagnosis not present

## 2016-05-15 DIAGNOSIS — Z89611 Acquired absence of right leg above knee: Secondary | ICD-10-CM | POA: Diagnosis not present

## 2016-05-15 DIAGNOSIS — Z89612 Acquired absence of left leg above knee: Secondary | ICD-10-CM | POA: Diagnosis not present

## 2016-05-15 DIAGNOSIS — M6281 Muscle weakness (generalized): Secondary | ICD-10-CM | POA: Diagnosis not present

## 2016-05-17 DIAGNOSIS — R262 Difficulty in walking, not elsewhere classified: Secondary | ICD-10-CM | POA: Diagnosis not present

## 2016-05-17 DIAGNOSIS — Z89612 Acquired absence of left leg above knee: Secondary | ICD-10-CM | POA: Diagnosis not present

## 2016-05-17 DIAGNOSIS — Z89611 Acquired absence of right leg above knee: Secondary | ICD-10-CM | POA: Diagnosis not present

## 2016-05-17 DIAGNOSIS — M6281 Muscle weakness (generalized): Secondary | ICD-10-CM | POA: Diagnosis not present

## 2016-05-22 DIAGNOSIS — Z89612 Acquired absence of left leg above knee: Secondary | ICD-10-CM | POA: Diagnosis not present

## 2016-05-22 DIAGNOSIS — Z89611 Acquired absence of right leg above knee: Secondary | ICD-10-CM | POA: Diagnosis not present

## 2016-05-22 DIAGNOSIS — R262 Difficulty in walking, not elsewhere classified: Secondary | ICD-10-CM | POA: Diagnosis not present

## 2016-05-22 DIAGNOSIS — M6281 Muscle weakness (generalized): Secondary | ICD-10-CM | POA: Diagnosis not present

## 2016-05-24 DIAGNOSIS — M6281 Muscle weakness (generalized): Secondary | ICD-10-CM | POA: Diagnosis not present

## 2016-05-24 DIAGNOSIS — R262 Difficulty in walking, not elsewhere classified: Secondary | ICD-10-CM | POA: Diagnosis not present

## 2016-05-24 DIAGNOSIS — Z89612 Acquired absence of left leg above knee: Secondary | ICD-10-CM | POA: Diagnosis not present

## 2016-05-24 DIAGNOSIS — Z89611 Acquired absence of right leg above knee: Secondary | ICD-10-CM | POA: Diagnosis not present

## 2016-05-29 DIAGNOSIS — M6281 Muscle weakness (generalized): Secondary | ICD-10-CM | POA: Diagnosis not present

## 2016-05-29 DIAGNOSIS — R262 Difficulty in walking, not elsewhere classified: Secondary | ICD-10-CM | POA: Diagnosis not present

## 2016-05-29 DIAGNOSIS — Z89612 Acquired absence of left leg above knee: Secondary | ICD-10-CM | POA: Diagnosis not present

## 2016-05-29 DIAGNOSIS — Z89611 Acquired absence of right leg above knee: Secondary | ICD-10-CM | POA: Diagnosis not present

## 2016-05-31 DIAGNOSIS — R262 Difficulty in walking, not elsewhere classified: Secondary | ICD-10-CM | POA: Diagnosis not present

## 2016-05-31 DIAGNOSIS — M6281 Muscle weakness (generalized): Secondary | ICD-10-CM | POA: Diagnosis not present

## 2016-05-31 DIAGNOSIS — Z89611 Acquired absence of right leg above knee: Secondary | ICD-10-CM | POA: Diagnosis not present

## 2016-05-31 DIAGNOSIS — Z89612 Acquired absence of left leg above knee: Secondary | ICD-10-CM | POA: Diagnosis not present

## 2016-06-05 DIAGNOSIS — R262 Difficulty in walking, not elsewhere classified: Secondary | ICD-10-CM | POA: Diagnosis not present

## 2016-06-05 DIAGNOSIS — Z89611 Acquired absence of right leg above knee: Secondary | ICD-10-CM | POA: Diagnosis not present

## 2016-06-05 DIAGNOSIS — M6281 Muscle weakness (generalized): Secondary | ICD-10-CM | POA: Diagnosis not present

## 2016-06-05 DIAGNOSIS — Z89612 Acquired absence of left leg above knee: Secondary | ICD-10-CM | POA: Diagnosis not present

## 2016-06-07 DIAGNOSIS — R262 Difficulty in walking, not elsewhere classified: Secondary | ICD-10-CM | POA: Diagnosis not present

## 2016-06-07 DIAGNOSIS — Z89611 Acquired absence of right leg above knee: Secondary | ICD-10-CM | POA: Diagnosis not present

## 2016-06-07 DIAGNOSIS — Z89612 Acquired absence of left leg above knee: Secondary | ICD-10-CM | POA: Diagnosis not present

## 2016-06-07 DIAGNOSIS — M6281 Muscle weakness (generalized): Secondary | ICD-10-CM | POA: Diagnosis not present

## 2016-06-08 DIAGNOSIS — H3589 Other specified retinal disorders: Secondary | ICD-10-CM | POA: Diagnosis not present

## 2016-06-08 DIAGNOSIS — H40013 Open angle with borderline findings, low risk, bilateral: Secondary | ICD-10-CM | POA: Diagnosis not present

## 2016-06-08 DIAGNOSIS — H47233 Glaucomatous optic atrophy, bilateral: Secondary | ICD-10-CM | POA: Diagnosis not present

## 2016-06-12 DIAGNOSIS — R262 Difficulty in walking, not elsewhere classified: Secondary | ICD-10-CM | POA: Diagnosis not present

## 2016-06-12 DIAGNOSIS — Z89611 Acquired absence of right leg above knee: Secondary | ICD-10-CM | POA: Diagnosis not present

## 2016-06-12 DIAGNOSIS — Z89612 Acquired absence of left leg above knee: Secondary | ICD-10-CM | POA: Diagnosis not present

## 2016-06-12 DIAGNOSIS — M6281 Muscle weakness (generalized): Secondary | ICD-10-CM | POA: Diagnosis not present

## 2016-06-14 DIAGNOSIS — Z89612 Acquired absence of left leg above knee: Secondary | ICD-10-CM | POA: Diagnosis not present

## 2016-06-14 DIAGNOSIS — R262 Difficulty in walking, not elsewhere classified: Secondary | ICD-10-CM | POA: Diagnosis not present

## 2016-06-14 DIAGNOSIS — M6281 Muscle weakness (generalized): Secondary | ICD-10-CM | POA: Diagnosis not present

## 2016-06-14 DIAGNOSIS — Z89611 Acquired absence of right leg above knee: Secondary | ICD-10-CM | POA: Diagnosis not present

## 2016-06-19 DIAGNOSIS — M6281 Muscle weakness (generalized): Secondary | ICD-10-CM | POA: Diagnosis not present

## 2016-06-19 DIAGNOSIS — Z89611 Acquired absence of right leg above knee: Secondary | ICD-10-CM | POA: Diagnosis not present

## 2016-06-19 DIAGNOSIS — R262 Difficulty in walking, not elsewhere classified: Secondary | ICD-10-CM | POA: Diagnosis not present

## 2016-06-19 DIAGNOSIS — Z89612 Acquired absence of left leg above knee: Secondary | ICD-10-CM | POA: Diagnosis not present

## 2016-06-21 DIAGNOSIS — Z89611 Acquired absence of right leg above knee: Secondary | ICD-10-CM | POA: Diagnosis not present

## 2016-06-21 DIAGNOSIS — Z89612 Acquired absence of left leg above knee: Secondary | ICD-10-CM | POA: Diagnosis not present

## 2016-06-21 DIAGNOSIS — R262 Difficulty in walking, not elsewhere classified: Secondary | ICD-10-CM | POA: Diagnosis not present

## 2016-06-21 DIAGNOSIS — M6281 Muscle weakness (generalized): Secondary | ICD-10-CM | POA: Diagnosis not present

## 2016-07-03 DIAGNOSIS — R262 Difficulty in walking, not elsewhere classified: Secondary | ICD-10-CM | POA: Diagnosis not present

## 2016-07-03 DIAGNOSIS — Z89612 Acquired absence of left leg above knee: Secondary | ICD-10-CM | POA: Diagnosis not present

## 2016-07-03 DIAGNOSIS — M6281 Muscle weakness (generalized): Secondary | ICD-10-CM | POA: Diagnosis not present

## 2016-07-03 DIAGNOSIS — Z89611 Acquired absence of right leg above knee: Secondary | ICD-10-CM | POA: Diagnosis not present

## 2016-07-05 DIAGNOSIS — M6281 Muscle weakness (generalized): Secondary | ICD-10-CM | POA: Diagnosis not present

## 2016-07-05 DIAGNOSIS — Z89611 Acquired absence of right leg above knee: Secondary | ICD-10-CM | POA: Diagnosis not present

## 2016-07-05 DIAGNOSIS — Z89612 Acquired absence of left leg above knee: Secondary | ICD-10-CM | POA: Diagnosis not present

## 2016-07-05 DIAGNOSIS — R262 Difficulty in walking, not elsewhere classified: Secondary | ICD-10-CM | POA: Diagnosis not present

## 2016-07-10 DIAGNOSIS — R262 Difficulty in walking, not elsewhere classified: Secondary | ICD-10-CM | POA: Diagnosis not present

## 2016-07-10 DIAGNOSIS — Z89611 Acquired absence of right leg above knee: Secondary | ICD-10-CM | POA: Diagnosis not present

## 2016-07-10 DIAGNOSIS — M6281 Muscle weakness (generalized): Secondary | ICD-10-CM | POA: Diagnosis not present

## 2016-07-10 DIAGNOSIS — Z89612 Acquired absence of left leg above knee: Secondary | ICD-10-CM | POA: Diagnosis not present

## 2016-07-12 DIAGNOSIS — M6281 Muscle weakness (generalized): Secondary | ICD-10-CM | POA: Diagnosis not present

## 2016-07-12 DIAGNOSIS — Z89611 Acquired absence of right leg above knee: Secondary | ICD-10-CM | POA: Diagnosis not present

## 2016-07-12 DIAGNOSIS — Z89612 Acquired absence of left leg above knee: Secondary | ICD-10-CM | POA: Diagnosis not present

## 2016-07-12 DIAGNOSIS — R262 Difficulty in walking, not elsewhere classified: Secondary | ICD-10-CM | POA: Diagnosis not present

## 2016-07-17 DIAGNOSIS — Z89611 Acquired absence of right leg above knee: Secondary | ICD-10-CM | POA: Diagnosis not present

## 2016-07-17 DIAGNOSIS — R262 Difficulty in walking, not elsewhere classified: Secondary | ICD-10-CM | POA: Diagnosis not present

## 2016-07-17 DIAGNOSIS — M6281 Muscle weakness (generalized): Secondary | ICD-10-CM | POA: Diagnosis not present

## 2016-07-17 DIAGNOSIS — Z89612 Acquired absence of left leg above knee: Secondary | ICD-10-CM | POA: Diagnosis not present

## 2016-07-19 DIAGNOSIS — Z89611 Acquired absence of right leg above knee: Secondary | ICD-10-CM | POA: Diagnosis not present

## 2016-07-19 DIAGNOSIS — R262 Difficulty in walking, not elsewhere classified: Secondary | ICD-10-CM | POA: Diagnosis not present

## 2016-07-19 DIAGNOSIS — M6281 Muscle weakness (generalized): Secondary | ICD-10-CM | POA: Diagnosis not present

## 2016-07-19 DIAGNOSIS — Z89612 Acquired absence of left leg above knee: Secondary | ICD-10-CM | POA: Diagnosis not present

## 2016-07-24 DIAGNOSIS — Z89612 Acquired absence of left leg above knee: Secondary | ICD-10-CM | POA: Diagnosis not present

## 2016-07-24 DIAGNOSIS — R262 Difficulty in walking, not elsewhere classified: Secondary | ICD-10-CM | POA: Diagnosis not present

## 2016-07-24 DIAGNOSIS — Z89611 Acquired absence of right leg above knee: Secondary | ICD-10-CM | POA: Diagnosis not present

## 2016-07-24 DIAGNOSIS — M6281 Muscle weakness (generalized): Secondary | ICD-10-CM | POA: Diagnosis not present

## 2016-07-31 DIAGNOSIS — M6281 Muscle weakness (generalized): Secondary | ICD-10-CM | POA: Diagnosis not present

## 2016-07-31 DIAGNOSIS — Z89612 Acquired absence of left leg above knee: Secondary | ICD-10-CM | POA: Diagnosis not present

## 2016-07-31 DIAGNOSIS — R262 Difficulty in walking, not elsewhere classified: Secondary | ICD-10-CM | POA: Diagnosis not present

## 2016-07-31 DIAGNOSIS — Z89611 Acquired absence of right leg above knee: Secondary | ICD-10-CM | POA: Diagnosis not present

## 2016-08-02 DIAGNOSIS — R262 Difficulty in walking, not elsewhere classified: Secondary | ICD-10-CM | POA: Diagnosis not present

## 2016-08-02 DIAGNOSIS — Z89612 Acquired absence of left leg above knee: Secondary | ICD-10-CM | POA: Diagnosis not present

## 2016-08-02 DIAGNOSIS — M6281 Muscle weakness (generalized): Secondary | ICD-10-CM | POA: Diagnosis not present

## 2016-08-02 DIAGNOSIS — Z89611 Acquired absence of right leg above knee: Secondary | ICD-10-CM | POA: Diagnosis not present

## 2016-08-07 DIAGNOSIS — Z89612 Acquired absence of left leg above knee: Secondary | ICD-10-CM | POA: Diagnosis not present

## 2016-08-07 DIAGNOSIS — R262 Difficulty in walking, not elsewhere classified: Secondary | ICD-10-CM | POA: Diagnosis not present

## 2016-08-07 DIAGNOSIS — Z89611 Acquired absence of right leg above knee: Secondary | ICD-10-CM | POA: Diagnosis not present

## 2016-08-07 DIAGNOSIS — M6281 Muscle weakness (generalized): Secondary | ICD-10-CM | POA: Diagnosis not present

## 2016-08-09 DIAGNOSIS — Z89611 Acquired absence of right leg above knee: Secondary | ICD-10-CM | POA: Diagnosis not present

## 2016-08-09 DIAGNOSIS — Z89612 Acquired absence of left leg above knee: Secondary | ICD-10-CM | POA: Diagnosis not present

## 2016-08-09 DIAGNOSIS — M6281 Muscle weakness (generalized): Secondary | ICD-10-CM | POA: Diagnosis not present

## 2016-08-09 DIAGNOSIS — R262 Difficulty in walking, not elsewhere classified: Secondary | ICD-10-CM | POA: Diagnosis not present

## 2016-08-16 DIAGNOSIS — Z89611 Acquired absence of right leg above knee: Secondary | ICD-10-CM | POA: Diagnosis not present

## 2016-08-16 DIAGNOSIS — R262 Difficulty in walking, not elsewhere classified: Secondary | ICD-10-CM | POA: Diagnosis not present

## 2016-08-16 DIAGNOSIS — Z89612 Acquired absence of left leg above knee: Secondary | ICD-10-CM | POA: Diagnosis not present

## 2016-08-16 DIAGNOSIS — M6281 Muscle weakness (generalized): Secondary | ICD-10-CM | POA: Diagnosis not present

## 2016-08-23 DIAGNOSIS — R262 Difficulty in walking, not elsewhere classified: Secondary | ICD-10-CM | POA: Diagnosis not present

## 2016-08-23 DIAGNOSIS — Z89611 Acquired absence of right leg above knee: Secondary | ICD-10-CM | POA: Diagnosis not present

## 2016-08-23 DIAGNOSIS — M6281 Muscle weakness (generalized): Secondary | ICD-10-CM | POA: Diagnosis not present

## 2016-08-23 DIAGNOSIS — Z89612 Acquired absence of left leg above knee: Secondary | ICD-10-CM | POA: Diagnosis not present

## 2016-08-30 DIAGNOSIS — M6281 Muscle weakness (generalized): Secondary | ICD-10-CM | POA: Diagnosis not present

## 2016-08-30 DIAGNOSIS — R262 Difficulty in walking, not elsewhere classified: Secondary | ICD-10-CM | POA: Diagnosis not present

## 2016-08-30 DIAGNOSIS — Z89611 Acquired absence of right leg above knee: Secondary | ICD-10-CM | POA: Diagnosis not present

## 2016-08-30 DIAGNOSIS — Z89612 Acquired absence of left leg above knee: Secondary | ICD-10-CM | POA: Diagnosis not present

## 2016-09-04 DIAGNOSIS — Z89611 Acquired absence of right leg above knee: Secondary | ICD-10-CM | POA: Diagnosis not present

## 2016-09-04 DIAGNOSIS — Z89612 Acquired absence of left leg above knee: Secondary | ICD-10-CM | POA: Diagnosis not present

## 2016-09-04 DIAGNOSIS — R262 Difficulty in walking, not elsewhere classified: Secondary | ICD-10-CM | POA: Diagnosis not present

## 2016-09-04 DIAGNOSIS — M6281 Muscle weakness (generalized): Secondary | ICD-10-CM | POA: Diagnosis not present

## 2016-09-06 DIAGNOSIS — Z89611 Acquired absence of right leg above knee: Secondary | ICD-10-CM | POA: Diagnosis not present

## 2016-09-06 DIAGNOSIS — Z89612 Acquired absence of left leg above knee: Secondary | ICD-10-CM | POA: Diagnosis not present

## 2016-09-06 DIAGNOSIS — M6281 Muscle weakness (generalized): Secondary | ICD-10-CM | POA: Diagnosis not present

## 2016-09-06 DIAGNOSIS — R262 Difficulty in walking, not elsewhere classified: Secondary | ICD-10-CM | POA: Diagnosis not present

## 2016-09-13 DIAGNOSIS — Z89612 Acquired absence of left leg above knee: Secondary | ICD-10-CM | POA: Diagnosis not present

## 2016-09-13 DIAGNOSIS — M6281 Muscle weakness (generalized): Secondary | ICD-10-CM | POA: Diagnosis not present

## 2016-09-13 DIAGNOSIS — R262 Difficulty in walking, not elsewhere classified: Secondary | ICD-10-CM | POA: Diagnosis not present

## 2016-09-13 DIAGNOSIS — Z89611 Acquired absence of right leg above knee: Secondary | ICD-10-CM | POA: Diagnosis not present

## 2016-09-18 DIAGNOSIS — R262 Difficulty in walking, not elsewhere classified: Secondary | ICD-10-CM | POA: Diagnosis not present

## 2016-09-18 DIAGNOSIS — Z89612 Acquired absence of left leg above knee: Secondary | ICD-10-CM | POA: Diagnosis not present

## 2016-09-18 DIAGNOSIS — Z89611 Acquired absence of right leg above knee: Secondary | ICD-10-CM | POA: Diagnosis not present

## 2016-09-18 DIAGNOSIS — M6281 Muscle weakness (generalized): Secondary | ICD-10-CM | POA: Diagnosis not present

## 2016-09-20 DIAGNOSIS — M6281 Muscle weakness (generalized): Secondary | ICD-10-CM | POA: Diagnosis not present

## 2016-09-20 DIAGNOSIS — R262 Difficulty in walking, not elsewhere classified: Secondary | ICD-10-CM | POA: Diagnosis not present

## 2016-09-20 DIAGNOSIS — Z89612 Acquired absence of left leg above knee: Secondary | ICD-10-CM | POA: Diagnosis not present

## 2016-09-20 DIAGNOSIS — Z89611 Acquired absence of right leg above knee: Secondary | ICD-10-CM | POA: Diagnosis not present

## 2016-09-26 DIAGNOSIS — Z89612 Acquired absence of left leg above knee: Secondary | ICD-10-CM | POA: Diagnosis not present

## 2016-09-26 DIAGNOSIS — R262 Difficulty in walking, not elsewhere classified: Secondary | ICD-10-CM | POA: Diagnosis not present

## 2016-09-26 DIAGNOSIS — Z89611 Acquired absence of right leg above knee: Secondary | ICD-10-CM | POA: Diagnosis not present

## 2016-09-26 DIAGNOSIS — M6281 Muscle weakness (generalized): Secondary | ICD-10-CM | POA: Diagnosis not present

## 2016-09-28 DIAGNOSIS — R262 Difficulty in walking, not elsewhere classified: Secondary | ICD-10-CM | POA: Diagnosis not present

## 2016-09-28 DIAGNOSIS — Z89611 Acquired absence of right leg above knee: Secondary | ICD-10-CM | POA: Diagnosis not present

## 2016-09-28 DIAGNOSIS — M6281 Muscle weakness (generalized): Secondary | ICD-10-CM | POA: Diagnosis not present

## 2016-09-28 DIAGNOSIS — Z89612 Acquired absence of left leg above knee: Secondary | ICD-10-CM | POA: Diagnosis not present

## 2016-10-02 DIAGNOSIS — Z89611 Acquired absence of right leg above knee: Secondary | ICD-10-CM | POA: Diagnosis not present

## 2016-10-02 DIAGNOSIS — R262 Difficulty in walking, not elsewhere classified: Secondary | ICD-10-CM | POA: Diagnosis not present

## 2016-10-02 DIAGNOSIS — M6281 Muscle weakness (generalized): Secondary | ICD-10-CM | POA: Diagnosis not present

## 2016-10-02 DIAGNOSIS — Z89612 Acquired absence of left leg above knee: Secondary | ICD-10-CM | POA: Diagnosis not present

## 2016-10-04 DIAGNOSIS — R262 Difficulty in walking, not elsewhere classified: Secondary | ICD-10-CM | POA: Diagnosis not present

## 2016-10-04 DIAGNOSIS — Z89612 Acquired absence of left leg above knee: Secondary | ICD-10-CM | POA: Diagnosis not present

## 2016-10-04 DIAGNOSIS — M6281 Muscle weakness (generalized): Secondary | ICD-10-CM | POA: Diagnosis not present

## 2016-10-04 DIAGNOSIS — Z89611 Acquired absence of right leg above knee: Secondary | ICD-10-CM | POA: Diagnosis not present

## 2016-10-11 DIAGNOSIS — M6281 Muscle weakness (generalized): Secondary | ICD-10-CM | POA: Diagnosis not present

## 2016-10-11 DIAGNOSIS — Z89611 Acquired absence of right leg above knee: Secondary | ICD-10-CM | POA: Diagnosis not present

## 2016-10-11 DIAGNOSIS — R262 Difficulty in walking, not elsewhere classified: Secondary | ICD-10-CM | POA: Diagnosis not present

## 2016-10-11 DIAGNOSIS — Z89612 Acquired absence of left leg above knee: Secondary | ICD-10-CM | POA: Diagnosis not present

## 2016-10-16 DIAGNOSIS — R262 Difficulty in walking, not elsewhere classified: Secondary | ICD-10-CM | POA: Diagnosis not present

## 2016-10-16 DIAGNOSIS — Z89612 Acquired absence of left leg above knee: Secondary | ICD-10-CM | POA: Diagnosis not present

## 2016-10-16 DIAGNOSIS — Z89611 Acquired absence of right leg above knee: Secondary | ICD-10-CM | POA: Diagnosis not present

## 2016-10-16 DIAGNOSIS — M6281 Muscle weakness (generalized): Secondary | ICD-10-CM | POA: Diagnosis not present

## 2016-10-18 DIAGNOSIS — Z89612 Acquired absence of left leg above knee: Secondary | ICD-10-CM | POA: Diagnosis not present

## 2016-10-18 DIAGNOSIS — M6281 Muscle weakness (generalized): Secondary | ICD-10-CM | POA: Diagnosis not present

## 2016-10-18 DIAGNOSIS — R262 Difficulty in walking, not elsewhere classified: Secondary | ICD-10-CM | POA: Diagnosis not present

## 2016-10-18 DIAGNOSIS — Z89611 Acquired absence of right leg above knee: Secondary | ICD-10-CM | POA: Diagnosis not present

## 2016-10-23 DIAGNOSIS — Z89612 Acquired absence of left leg above knee: Secondary | ICD-10-CM | POA: Diagnosis not present

## 2016-10-23 DIAGNOSIS — Z89611 Acquired absence of right leg above knee: Secondary | ICD-10-CM | POA: Diagnosis not present

## 2016-10-23 DIAGNOSIS — M6281 Muscle weakness (generalized): Secondary | ICD-10-CM | POA: Diagnosis not present

## 2016-10-23 DIAGNOSIS — R262 Difficulty in walking, not elsewhere classified: Secondary | ICD-10-CM | POA: Diagnosis not present

## 2016-10-25 DIAGNOSIS — Z89612 Acquired absence of left leg above knee: Secondary | ICD-10-CM | POA: Diagnosis not present

## 2016-10-25 DIAGNOSIS — R262 Difficulty in walking, not elsewhere classified: Secondary | ICD-10-CM | POA: Diagnosis not present

## 2016-10-25 DIAGNOSIS — M6281 Muscle weakness (generalized): Secondary | ICD-10-CM | POA: Diagnosis not present

## 2016-10-25 DIAGNOSIS — Z89611 Acquired absence of right leg above knee: Secondary | ICD-10-CM | POA: Diagnosis not present

## 2016-10-30 DIAGNOSIS — M6281 Muscle weakness (generalized): Secondary | ICD-10-CM | POA: Diagnosis not present

## 2016-10-30 DIAGNOSIS — R262 Difficulty in walking, not elsewhere classified: Secondary | ICD-10-CM | POA: Diagnosis not present

## 2016-10-30 DIAGNOSIS — Z89612 Acquired absence of left leg above knee: Secondary | ICD-10-CM | POA: Diagnosis not present

## 2016-10-30 DIAGNOSIS — Z89611 Acquired absence of right leg above knee: Secondary | ICD-10-CM | POA: Diagnosis not present

## 2016-10-30 DIAGNOSIS — M67431 Ganglion, right wrist: Secondary | ICD-10-CM | POA: Diagnosis not present

## 2016-11-01 DIAGNOSIS — Z89611 Acquired absence of right leg above knee: Secondary | ICD-10-CM | POA: Diagnosis not present

## 2016-11-01 DIAGNOSIS — M6281 Muscle weakness (generalized): Secondary | ICD-10-CM | POA: Diagnosis not present

## 2016-11-01 DIAGNOSIS — R262 Difficulty in walking, not elsewhere classified: Secondary | ICD-10-CM | POA: Diagnosis not present

## 2016-11-01 DIAGNOSIS — Z89612 Acquired absence of left leg above knee: Secondary | ICD-10-CM | POA: Diagnosis not present

## 2016-11-06 DIAGNOSIS — Z89611 Acquired absence of right leg above knee: Secondary | ICD-10-CM | POA: Diagnosis not present

## 2016-11-06 DIAGNOSIS — Z89612 Acquired absence of left leg above knee: Secondary | ICD-10-CM | POA: Diagnosis not present

## 2016-11-06 DIAGNOSIS — R262 Difficulty in walking, not elsewhere classified: Secondary | ICD-10-CM | POA: Diagnosis not present

## 2016-11-06 DIAGNOSIS — M6281 Muscle weakness (generalized): Secondary | ICD-10-CM | POA: Diagnosis not present

## 2016-11-08 DIAGNOSIS — R262 Difficulty in walking, not elsewhere classified: Secondary | ICD-10-CM | POA: Diagnosis not present

## 2016-11-08 DIAGNOSIS — Z89611 Acquired absence of right leg above knee: Secondary | ICD-10-CM | POA: Diagnosis not present

## 2016-11-08 DIAGNOSIS — M6281 Muscle weakness (generalized): Secondary | ICD-10-CM | POA: Diagnosis not present

## 2016-11-08 DIAGNOSIS — Z89612 Acquired absence of left leg above knee: Secondary | ICD-10-CM | POA: Diagnosis not present

## 2016-11-13 DIAGNOSIS — R262 Difficulty in walking, not elsewhere classified: Secondary | ICD-10-CM | POA: Diagnosis not present

## 2016-11-13 DIAGNOSIS — Z89611 Acquired absence of right leg above knee: Secondary | ICD-10-CM | POA: Diagnosis not present

## 2016-11-13 DIAGNOSIS — M6281 Muscle weakness (generalized): Secondary | ICD-10-CM | POA: Diagnosis not present

## 2016-11-13 DIAGNOSIS — Z89612 Acquired absence of left leg above knee: Secondary | ICD-10-CM | POA: Diagnosis not present

## 2016-11-20 DIAGNOSIS — R262 Difficulty in walking, not elsewhere classified: Secondary | ICD-10-CM | POA: Diagnosis not present

## 2016-11-20 DIAGNOSIS — Z89611 Acquired absence of right leg above knee: Secondary | ICD-10-CM | POA: Diagnosis not present

## 2016-11-20 DIAGNOSIS — M6281 Muscle weakness (generalized): Secondary | ICD-10-CM | POA: Diagnosis not present

## 2016-11-20 DIAGNOSIS — Z89612 Acquired absence of left leg above knee: Secondary | ICD-10-CM | POA: Diagnosis not present

## 2016-11-29 DIAGNOSIS — R262 Difficulty in walking, not elsewhere classified: Secondary | ICD-10-CM | POA: Diagnosis not present

## 2016-11-29 DIAGNOSIS — M6281 Muscle weakness (generalized): Secondary | ICD-10-CM | POA: Diagnosis not present

## 2016-11-29 DIAGNOSIS — Z89611 Acquired absence of right leg above knee: Secondary | ICD-10-CM | POA: Diagnosis not present

## 2016-11-29 DIAGNOSIS — Z89612 Acquired absence of left leg above knee: Secondary | ICD-10-CM | POA: Diagnosis not present

## 2016-12-04 DIAGNOSIS — Z89611 Acquired absence of right leg above knee: Secondary | ICD-10-CM | POA: Diagnosis not present

## 2016-12-04 DIAGNOSIS — M6281 Muscle weakness (generalized): Secondary | ICD-10-CM | POA: Diagnosis not present

## 2016-12-04 DIAGNOSIS — Z89612 Acquired absence of left leg above knee: Secondary | ICD-10-CM | POA: Diagnosis not present

## 2016-12-04 DIAGNOSIS — R262 Difficulty in walking, not elsewhere classified: Secondary | ICD-10-CM | POA: Diagnosis not present

## 2016-12-06 DIAGNOSIS — M6281 Muscle weakness (generalized): Secondary | ICD-10-CM | POA: Diagnosis not present

## 2016-12-06 DIAGNOSIS — Z89611 Acquired absence of right leg above knee: Secondary | ICD-10-CM | POA: Diagnosis not present

## 2016-12-06 DIAGNOSIS — Z89612 Acquired absence of left leg above knee: Secondary | ICD-10-CM | POA: Diagnosis not present

## 2016-12-06 DIAGNOSIS — R262 Difficulty in walking, not elsewhere classified: Secondary | ICD-10-CM | POA: Diagnosis not present

## 2016-12-11 DIAGNOSIS — Z89611 Acquired absence of right leg above knee: Secondary | ICD-10-CM | POA: Diagnosis not present

## 2016-12-11 DIAGNOSIS — R262 Difficulty in walking, not elsewhere classified: Secondary | ICD-10-CM | POA: Diagnosis not present

## 2016-12-11 DIAGNOSIS — M6281 Muscle weakness (generalized): Secondary | ICD-10-CM | POA: Diagnosis not present

## 2016-12-11 DIAGNOSIS — Z89612 Acquired absence of left leg above knee: Secondary | ICD-10-CM | POA: Diagnosis not present

## 2017-01-17 DIAGNOSIS — Z89612 Acquired absence of left leg above knee: Secondary | ICD-10-CM | POA: Diagnosis not present

## 2017-01-17 DIAGNOSIS — R262 Difficulty in walking, not elsewhere classified: Secondary | ICD-10-CM | POA: Diagnosis not present

## 2017-01-17 DIAGNOSIS — Z89611 Acquired absence of right leg above knee: Secondary | ICD-10-CM | POA: Diagnosis not present

## 2017-01-17 DIAGNOSIS — J4520 Mild intermittent asthma, uncomplicated: Secondary | ICD-10-CM | POA: Diagnosis not present

## 2017-01-25 DIAGNOSIS — R262 Difficulty in walking, not elsewhere classified: Secondary | ICD-10-CM | POA: Diagnosis not present

## 2017-01-25 DIAGNOSIS — M6281 Muscle weakness (generalized): Secondary | ICD-10-CM | POA: Diagnosis not present

## 2017-01-25 DIAGNOSIS — Z89612 Acquired absence of left leg above knee: Secondary | ICD-10-CM | POA: Diagnosis not present

## 2017-01-25 DIAGNOSIS — Z89611 Acquired absence of right leg above knee: Secondary | ICD-10-CM | POA: Diagnosis not present

## 2017-01-29 DIAGNOSIS — M6281 Muscle weakness (generalized): Secondary | ICD-10-CM | POA: Diagnosis not present

## 2017-01-29 DIAGNOSIS — Z89612 Acquired absence of left leg above knee: Secondary | ICD-10-CM | POA: Diagnosis not present

## 2017-01-29 DIAGNOSIS — Z89611 Acquired absence of right leg above knee: Secondary | ICD-10-CM | POA: Diagnosis not present

## 2017-01-29 DIAGNOSIS — R262 Difficulty in walking, not elsewhere classified: Secondary | ICD-10-CM | POA: Diagnosis not present

## 2017-01-31 DIAGNOSIS — M6281 Muscle weakness (generalized): Secondary | ICD-10-CM | POA: Diagnosis not present

## 2017-01-31 DIAGNOSIS — Z89611 Acquired absence of right leg above knee: Secondary | ICD-10-CM | POA: Diagnosis not present

## 2017-01-31 DIAGNOSIS — R262 Difficulty in walking, not elsewhere classified: Secondary | ICD-10-CM | POA: Diagnosis not present

## 2017-01-31 DIAGNOSIS — Z89612 Acquired absence of left leg above knee: Secondary | ICD-10-CM | POA: Diagnosis not present

## 2017-02-05 DIAGNOSIS — Z89611 Acquired absence of right leg above knee: Secondary | ICD-10-CM | POA: Diagnosis not present

## 2017-02-05 DIAGNOSIS — Z89612 Acquired absence of left leg above knee: Secondary | ICD-10-CM | POA: Diagnosis not present

## 2017-02-05 DIAGNOSIS — R262 Difficulty in walking, not elsewhere classified: Secondary | ICD-10-CM | POA: Diagnosis not present

## 2017-02-05 DIAGNOSIS — M6281 Muscle weakness (generalized): Secondary | ICD-10-CM | POA: Diagnosis not present

## 2017-02-07 DIAGNOSIS — M6281 Muscle weakness (generalized): Secondary | ICD-10-CM | POA: Diagnosis not present

## 2017-02-07 DIAGNOSIS — R262 Difficulty in walking, not elsewhere classified: Secondary | ICD-10-CM | POA: Diagnosis not present

## 2017-02-07 DIAGNOSIS — Z89611 Acquired absence of right leg above knee: Secondary | ICD-10-CM | POA: Diagnosis not present

## 2017-02-07 DIAGNOSIS — Z89612 Acquired absence of left leg above knee: Secondary | ICD-10-CM | POA: Diagnosis not present

## 2017-03-28 DIAGNOSIS — J45909 Unspecified asthma, uncomplicated: Secondary | ICD-10-CM | POA: Diagnosis not present

## 2017-03-28 DIAGNOSIS — R262 Difficulty in walking, not elsewhere classified: Secondary | ICD-10-CM | POA: Diagnosis not present

## 2018-05-02 DIAGNOSIS — J45909 Unspecified asthma, uncomplicated: Secondary | ICD-10-CM | POA: Diagnosis not present

## 2018-05-02 DIAGNOSIS — I739 Peripheral vascular disease, unspecified: Secondary | ICD-10-CM | POA: Diagnosis not present

## 2018-05-02 DIAGNOSIS — Z139 Encounter for screening, unspecified: Secondary | ICD-10-CM | POA: Diagnosis not present

## 2018-05-02 DIAGNOSIS — Z1331 Encounter for screening for depression: Secondary | ICD-10-CM | POA: Diagnosis not present

## 2018-05-02 DIAGNOSIS — G546 Phantom limb syndrome with pain: Secondary | ICD-10-CM | POA: Diagnosis not present

## 2018-05-02 DIAGNOSIS — Z89611 Acquired absence of right leg above knee: Secondary | ICD-10-CM | POA: Diagnosis not present

## 2018-05-14 DIAGNOSIS — J984 Other disorders of lung: Secondary | ICD-10-CM | POA: Diagnosis not present

## 2018-05-14 DIAGNOSIS — J45909 Unspecified asthma, uncomplicated: Secondary | ICD-10-CM | POA: Diagnosis not present

## 2018-05-14 DIAGNOSIS — R942 Abnormal results of pulmonary function studies: Secondary | ICD-10-CM | POA: Diagnosis not present

## 2018-05-14 DIAGNOSIS — J449 Chronic obstructive pulmonary disease, unspecified: Secondary | ICD-10-CM | POA: Diagnosis not present

## 2018-05-23 DIAGNOSIS — J9819 Other pulmonary collapse: Secondary | ICD-10-CM | POA: Diagnosis not present

## 2018-05-23 DIAGNOSIS — K8020 Calculus of gallbladder without cholecystitis without obstruction: Secondary | ICD-10-CM | POA: Diagnosis not present

## 2018-05-23 DIAGNOSIS — R942 Abnormal results of pulmonary function studies: Secondary | ICD-10-CM | POA: Diagnosis not present

## 2018-05-23 DIAGNOSIS — E279 Disorder of adrenal gland, unspecified: Secondary | ICD-10-CM | POA: Diagnosis not present

## 2018-05-23 DIAGNOSIS — J984 Other disorders of lung: Secondary | ICD-10-CM | POA: Diagnosis not present

## 2018-06-13 DIAGNOSIS — R9389 Abnormal findings on diagnostic imaging of other specified body structures: Secondary | ICD-10-CM | POA: Diagnosis not present

## 2018-06-13 DIAGNOSIS — J45909 Unspecified asthma, uncomplicated: Secondary | ICD-10-CM | POA: Diagnosis not present

## 2018-06-13 DIAGNOSIS — Z681 Body mass index (BMI) 19 or less, adult: Secondary | ICD-10-CM | POA: Diagnosis not present

## 2018-06-13 DIAGNOSIS — R636 Underweight: Secondary | ICD-10-CM | POA: Diagnosis not present

## 2018-06-19 ENCOUNTER — Institutional Professional Consult (permissible substitution): Payer: Self-pay | Admitting: Internal Medicine

## 2018-07-18 ENCOUNTER — Ambulatory Visit (INDEPENDENT_AMBULATORY_CARE_PROVIDER_SITE_OTHER)
Admission: RE | Admit: 2018-07-18 | Discharge: 2018-07-18 | Disposition: A | Discharge status: Home / Self Care | Payer: Medicare Other | Source: Ambulatory Visit | Attending: Internal Medicine | Admitting: Internal Medicine

## 2018-07-18 ENCOUNTER — Encounter: Payer: Self-pay | Admitting: Internal Medicine

## 2018-07-18 ENCOUNTER — Ambulatory Visit (INDEPENDENT_AMBULATORY_CARE_PROVIDER_SITE_OTHER): Payer: Medicare Other | Admitting: Internal Medicine

## 2018-07-18 VITALS — BP 106/60 | HR 104

## 2018-07-18 DIAGNOSIS — R05 Cough: Secondary | ICD-10-CM | POA: Diagnosis not present

## 2018-07-18 DIAGNOSIS — J449 Chronic obstructive pulmonary disease, unspecified: Secondary | ICD-10-CM | POA: Diagnosis not present

## 2018-07-18 DIAGNOSIS — R918 Other nonspecific abnormal finding of lung field: Secondary | ICD-10-CM

## 2018-07-18 DIAGNOSIS — R0609 Other forms of dyspnea: Secondary | ICD-10-CM | POA: Diagnosis not present

## 2018-07-18 MED ORDER — PREDNISONE 10 MG PO TABS
ORAL_TABLET | ORAL | 0 refills | Status: DC
Start: 2018-07-18 — End: 2018-07-30

## 2018-07-18 MED ORDER — PANTOPRAZOLE SODIUM 40 MG PO TBEC
40.0000 mg | DELAYED_RELEASE_TABLET | Freq: Every day | ORAL | 2 refills | Status: DC
Start: 2018-07-18 — End: 2018-09-03

## 2018-07-18 MED ORDER — FAMOTIDINE 20 MG PO TABS: ORAL_TABLET | ORAL | 11 refills | Status: DC

## 2018-07-18 NOTE — Progress Notes (Signed)
Gail Moore, female    DOB: April 04, 1951,     MRN: [id number]   Brief patient profile:  31 yobf quit smoking 2014 with good activity tolerance but bad PVD > bilateral aka in [place] and moved to [place] with new onset raspy voice /sob x 8 weeks > 05/21/18 c/w RLL obstruction so referred to pulmonary clinic 07/18/2018 by Dr   Marco Collie    07/18/2018 Initial pulmonary eval/ Gail Moore Chief Complaint  Patient presents with  . Pulmonary Consult    Referred by Dr. Marco Collie. Pt states having SOB and "gets tired easily" for years. She also c/o non prod cough and raspy voice. She uses albuterol neb sol 3 x per wk on average.   Dyspnea:  Ok at rest, in w/c Cough: lots of dry cough mostly daytime  SABA use: just neb/ hfa makes coughing worse    No obvious day to day or daytime variability or assoc excess/ purulent sputum or mucus plugs or hemoptysis or cp or chest tightness, subjective wheeze or overt sinus or hb symptoms.   Sleep: flat on two pillows  without nocturnal  or early am exacerbation  of respiratory  c/o's or need for noct saba. Also denies any obvious fluctuation of symptoms with weather or environmental changes or other aggravating or alleviating factors except as outlined above   No unusual exposure hx or h/o childhood pna/ asthma or knowledge of premature birth.  Current Allergies, Complete Past Medical History, Past Surgical History, Family History, and Social History were reviewed in [employer] record.  ROS  The following are not active complaints unless bolded Hoarseness, sore throat, dysphagia, dental problems, itching, sneezing,  nasal congestion or discharge of excess mucus or purulent secretions, ear ache,   fever, chills, sweats, unintended wt loss or wt gain, classically pleuritic or exertional cp,  orthopnea pnd or arm/hand swelling  or leg swelling, presyncope, palpitations, abdominal pain, anorexia, nausea, vomiting, diarrhea  or change in bowel  habits or change in bladder habits, change in stools or change in urine, dysuria, hematuria,  rash, arthralgias, visual complaints, headache, numbness, weakness or ataxia or problems with walking s/p bilateral aka  or coordination,  change in mood or  memory.            No past medical history on file.  Outpatient Medications Prior to Visit  Medication Sig Dispense Refill  . albuterol (PROVENTIL) (2.5 MG/3ML) 0.083% nebulizer solution Take 2.5 mg by nebulization every 6 (six) hours as needed for wheezing or shortness of breath.    . budesonide (PULMICORT) 0.5 MG/2ML nebulizer solution Take 0.5 mg by nebulization 2 (two) times daily.    . montelukast (SINGULAIR) 10 MG tablet Take 10 mg by mouth at bedtime.     No facility-administered medications prior to visit.             Objective:     BP 106/60 (BP Location: Left Arm, Cuff Size: Normal)   Pulse (!) 104   SpO2 94%   SpO2: 94 %  RA   Top dentures   W/c bound extremely hoarse bf nad at rest   HEENT: nl dentition, turbinates bilaterally, and oropharynx. Nl external ear canals without cough reflex   NECK :  without JVD/Nodes/TM/ nl carotid upstrokes bilaterally   LUNGS: no acc muscle use,  Nl contour chest with prominent upper airway wheezing on insp and exp  CV:  RRR  no s3 or murmur or increase in P2, and no  edema   ABD:  soft and nontender with nl inspiratory excursion in the supine position. No bruits or organomegaly appreciated, bowel sounds nl  MS:    No  cyanosis or clubbing - s/p Bilateral AKAs No obvious joint restrictions   SKIN: warm and dry without lesions    NEURO:  alert, approp, nl sensorium with  no motor or cerebellar deficits apparent.     CXR PA and Lateral:   07/18/2018 :    I personally reviewed images and agree with radiology impression as follows:   Right paratracheal mass. Right lower lung atelectasis and collapse. Left lung clear.      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  See lung mass a/p  COPD GOLD 0  Quit smoking 2014  - Spirometry 07/18/2018  FEV1 1.01 (51%)  Ratio 81 with abn contour f/v s truncation or typical curvature for copd  - 07/18/2018  After extensive coaching inhaler device  effectiveness =    0% and caused choking cessation so changed to bud/performist neb bid and albuterol neb prn   Would like to see if we can improve her breathing prior to FOB though most of her present symptoms of sob are directly related to tumor and not to copd - see doe  Lung mass See CT [place] 05/16/18   cxr today confirms same mass/RLL complete collapse so will need fob for tissue dx as there is likely direct airway involvement and we can also confirm R VC paralysis at that point    Discussed in detail all the  indications, usual  risks and alternatives  relative to the benefits with patient who agrees to proceed with bronchoscopy with biopsy as soon as it can be scheduled  In the next two weeks after we first see if her breathing can be improved with the other option = PET and see if other sites   amenable to bx (though would not give [place] as much info as direct fob)    Total time devoted to counseling  > 50 % of initial 60 min office visit:  review case with pt/daughter Gail Moore  discussion of options/alternatives/ personally creating written customized instructions  in presence of pt  then going over those specific  Instructions directly with the pt including how to use all of the meds but in particular covering each new  medication in detail and the difference between the maintenance= "automatic" meds and the prns using an action plan format for the latter (If this problem/symptom => do that organization reading Left to right).  Please see AVS from this visit for a full list of these instructions which I personally wrote for this pt and  are unique to this visit.      Christinia Gully, MD 07/18/2018

## 2018-07-18 NOTE — Patient Instructions (Addendum)
Stop proair   Plan A = Automatic = budesonide twice daily along with  performist    Plan B = Backup Only use your albuterol as a rescue medication to be used if you can't catch your breath     - The less you use it, the better it will work when you need it.b - Ok to use the nebulizer up to  every 4 hours if needed    Prednisone 10 mg take  4 each am x 2 days,   2 each am x 2 days,  1 each am x 2 days and stop    Pantoprazole (protonix) 40 mg   Take  30-60 min before first meal of the day and Pepcid (famotidine)  20 mg one @  bedtime until return to office - this is the best way to tell whether stomach acid is contributing to your problem.     GERD (REFLUX)  is an extremely common cause of respiratory symptoms just like yours , many times with no obvious heartburn at all.    It can be treated with medication, but also with lifestyle changes including elevation of the head of your bed (ideally with 6 inch  bed blocks),  Smoking cessation, avoidance of late meals, excessive alcohol, and avoid fatty foods, chocolate, peppermint, colas, red wine, and acidic juices such as orange juice.  NO MINT OR MENTHOL PRODUCTS SO NO COUGH DROPS   USE SUGARLESS CANDY INSTEAD (Jolley ranchers or Stover's or Life Savers) or even ice chips will also do - the key is to swallow to prevent all throat clearing. NO OIL BASED VITAMINS - use powdered substitutes.      Please remember to go to the  x-ray department downstairs in the basement  for your tests - we will call you with the results when they are available.       Please schedule a follow up office visit in 4 weeks, sooner if needed   .

## 2018-07-19 ENCOUNTER — Encounter: Payer: Self-pay | Admitting: Internal Medicine

## 2018-07-19 DIAGNOSIS — R918 Other nonspecific abnormal finding of lung field: Secondary | ICD-10-CM | POA: Insufficient documentation

## 2018-07-19 DIAGNOSIS — J449 Chronic obstructive pulmonary disease, unspecified: Secondary | ICD-10-CM | POA: Insufficient documentation

## 2018-07-19 NOTE — Assessment & Plan Note (Signed)
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  See lung mass a/p

## 2018-07-19 NOTE — Assessment & Plan Note (Signed)
Quit smoking 2014  - Spirometry 07/18/2018  FEV1 1.01 (51%)  Ratio 81 with abn contour f/v s truncation or typical curvature for copd  - 07/18/2018  After extensive coaching inhaler device  effectiveness =    0% and caused choking cessation so changed to bud/performist neb bid and albuterol neb prn   Would like to see if we can improve her breathing prior to FOB though most of her present symptoms of sob are directly related to tumor and not to copd - see doe

## 2018-07-19 NOTE — Assessment & Plan Note (Addendum)
See CT [place] 05/16/18   cxr today confirms same mass/RLL complete collapse so will need fob for tissue dx as there is likely direct airway involvement and we can also confirm R VC paralysis at that point    Discussed in detail all the  indications, usual  risks and alternatives  relative to the benefits with patient who agrees to proceed with bronchoscopy with biopsy as soon as it can be scheduled  In the next two weeks after we first see if her breathing can be improved with the other option = PET and see if other sites   amenable to bx (though would not give [place] as much info as direct fob)    Total time devoted to counseling  > 50 % of initial 60 min office visit:  review case with pt/daughter Blanch Media  discussion of options/alternatives/ personally creating written customized instructions  in presence of pt  then going over those specific  Instructions directly with the pt including how to use all of the meds but in particular covering each new medication in detail and the difference between the maintenance= "automatic" meds and the prns using an action plan format for the latter (If this problem/symptom => do that organization reading Left to right).  Please see AVS from this visit for a full list of these instructions which I personally wrote for this pt and  are unique to this visit.

## 2018-07-29 ENCOUNTER — Ambulatory Visit (INDEPENDENT_AMBULATORY_CARE_PROVIDER_SITE_OTHER): Payer: Medicare Other | Admitting: Pulmonary Disease

## 2018-07-29 ENCOUNTER — Encounter: Payer: Self-pay | Admitting: Pulmonary Disease

## 2018-07-29 VITALS — BP 110/70 | HR 112

## 2018-07-29 DIAGNOSIS — R0609 Other forms of dyspnea: Secondary | ICD-10-CM | POA: Diagnosis not present

## 2018-07-29 DIAGNOSIS — R918 Other nonspecific abnormal finding of lung field: Secondary | ICD-10-CM | POA: Diagnosis not present

## 2018-07-29 DIAGNOSIS — J449 Chronic obstructive pulmonary disease, unspecified: Secondary | ICD-10-CM

## 2018-07-29 MED ORDER — PREDNISONE 10 MG PO TABS: ORAL_TABLET | ORAL | 0 refills | Status: DC

## 2018-07-29 NOTE — Assessment & Plan Note (Signed)
  Bronchoscopy with Dr. Melvyn Novas on 07/31/2018 >>> Discussed with Dr. Melvyn Novas.  We will call the respiratory department as soon as it opens on 07/30/2018 to get you scheduled.  Then Dr. Melvyn Novas will call you tomorrow to schedule this appointment.  Plan on: >>>Arrive at [facility] long hospital admitting department at 7:15am  >>>This will be an outpatient procedure >>>Nothing by mouth 12am before

## 2018-07-29 NOTE — Patient Instructions (Addendum)
Prednsone 10mg  tablet  >>>4 tabs for 2 days, then 3 tabs for 2 days, 2 tabs for 2 days, then 1 tab for 2 days, then stop >>>take with food   Bronchoscopy with Dr. Melvyn Novas on 07/31/2018 >>> Discussed with Dr. Melvyn Novas.  We will call the respiratory department as soon as it opens on 07/30/2018 to get you scheduled.  Then Dr. Melvyn Novas will call you tomorrow to schedule this appointment.  Plan on: >>>Arrive at [facility] long hospital admitting department at 7:15am  >>>This will be an outpatient procedure >>>Nothing by mouth 12am before    Continue Pulmicort 2 times daily       Please contact the office if your symptoms worsen or you have concerns that you are not improving.   Thank you for choosing [facility] for your healthcare, and for allowing [place] to partner with you on your healthcare journey. I am thankful to be able to provide care to you today.   Wyn Quaker FNP-C

## 2018-07-29 NOTE — Progress Notes (Signed)
@Patient  ID: Gail Moore, female    DOB: December 22, 1951, 67 y.o.   MRN: [id number]  Chief Complaint  Patient presents with  . Acute Visit    States she started feeling better but after prednisone finished hoarseness came back along with chest tightness.     Referring provider: No ref. provider found  HPI: 67 year old female former smoker (quit smoking 2014) referred to our office on 07/18/2018 for right lower lobe obstruction. Patient of Dr. Melvyn Novas    Recent [facility] Encounters:   07/18/2018-Initial office visit-Wert 67 year old female former smoker (quit smoking 2014) referred to pulmonary clinic on 07/18/2018 due to right lower lobe obstruction.  Plan: Spirometry today, chest x-ray today, consider bronchoscopy versus PET over the next 2 weeks   Tests:   07/18/2018-spirometry- FVC 49%, ratio 81, FEV1 51%, severe restriction  Imaging:  05/23/2018-CT chest without contrast-near complete collapse of right lower lobe with filling defects in the right lower lobe bronchus, this could reflect mucous plugging or endobronchial lesions/tumor, given the associated right paratracheal mediastinal adenopathy lung cancer is a possibility, consider further evaluation with bronchoscopy, small pericardial effusion 07/18/2018-chest x-ray-little convincing change in right lower lobe collapse and right paratracheal mass 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  Patient accompanied with son Anhthu Perdew).  Patient and son both report that patient responded to prednisone.  But once prednisone was stopped breathing worsened.  Patient has been adherent to Pepcid as well as Protonix.  As well as her Pulmicort and duo nebulizers twice a day.  They present today for follow-up regarding sob.    No Known Allergies   There is no  immunization history on file for this patient. >>> Needs pneumonia vaccine as well as flu vaccine   History reviewed. No pertinent past medical history.  Tobacco History: Social History   Tobacco Use  Smoking Status Former Smoker  . Packs/day: 0.50  . Years: 15.00  . Pack years: 7.50  . Types: Cigarettes  . Last attempt to quit: 12/24/2012  . Years since quitting: 5.5  Smokeless Tobacco Never Used   Counseling given: Yes  Continue not smoking   Outpatient Encounter Medications as of 07/29/2018  Medication Sig  . albuterol (PROVENTIL) (2.5 MG/3ML) 0.083% nebulizer solution Take 2.5 mg by nebulization every 6 (six) hours as needed for wheezing or shortness of breath.  . budesonide (PULMICORT) 0.5 MG/2ML nebulizer solution Take 0.5 mg by nebulization 2 (two) times daily.  . famotidine (PEPCID) 20 MG tablet One at bedtime  . montelukast (SINGULAIR) 10 MG tablet Take 10 mg by mouth at bedtime.  . pantoprazole (PROTONIX) 40 MG tablet Take 1 tablet (40 mg total) by mouth daily. Take 30-60 min before first meal of the day  . predniSONE (DELTASONE) 10 MG tablet Take  4 each am x 2 days,   2 each am x 2 days,  1 each am x 2 days and stop (Patient not taking: Reported on 07/29/2018)  . predniSONE (DELTASONE) 10 MG tablet 4 tabs for 2 days, then 3 tabs for 2 days, 2 tabs for 2 days, then 1 tab for 2 days, then stop   No facility-administered encounter medications on file as of 07/29/2018.      Review of Systems  Review of Systems  Constitutional: Positive for fatigue. Negative for chills, fever and unexpected  weight change.  HENT: Negative for congestion, ear pain and sore throat.   Respiratory: Positive for shortness of breath and wheezing. Negative for cough and chest tightness.   Cardiovascular: Negative for chest pain and palpitations.  Gastrointestinal: Negative for blood in stool, diarrhea, nausea and vomiting.  Genitourinary: Negative for dysuria and frequency.  Musculoskeletal:  Negative for arthralgias.  Skin: Positive for color change (UE bilateral lightening ).  Neurological: Negative for headaches.  Psychiatric/Behavioral: Positive for dysphoric mood.       Physical Exam  BP 110/70   Pulse (!) 112   SpO2 97%   Wt Readings from Last 5 Encounters:  No data found for Wt     Physical Exam  Constitutional: She is oriented to person, place, and time and well-developed, well-nourished, and in no distress. No distress.  HENT:  Head: Normocephalic and atraumatic.  Right Ear: Hearing and external ear normal.  Left Ear: Hearing and external ear normal.  Mouth/Throat: Uvula is midline and oropharynx is clear and moist. No oropharyngeal exudate.  Eyes: Pupils are equal, round, and reactive to light.  Neck: Normal range of motion. Neck supple. No JVD present.  Cardiovascular: Normal rate, regular rhythm and normal heart sounds.  Pulmonary/Chest: Effort normal. No accessory muscle usage. No respiratory distress. She has no decreased breath sounds. She has wheezes (slight inspiratory / exp wheezes bilaterally ). She has no rhonchi.  Musculoskeletal: Normal range of motion. She exhibits no edema.  Bilateral AKA   Lymphadenopathy:    She has no cervical adenopathy.  Neurological: She is alert and oriented to person, place, and time. Gait normal.  Skin: Skin is warm and dry. She is not diaphoretic. No erythema.  Psychiatric: Mood, memory, affect and judgment normal.  Nursing note and vitals reviewed.     Lab Results:  CBC No results found for: WBC, RBC, HGB, HCT, PLT, MCV, MCH, MCHC, RDW, LYMPHSABS, MONOABS, EOSABS, BASOSABS  BMET No results found for: NA, K, CL, CO2, GLUCOSE, BUN, CREATININE, CALCIUM, GFRNONAA, GFRAA  BNP No results found for: BNP  ProBNP No results found for: PROBNP  Imaging: Dg Chest 2 View  Result Date: 07/18/2018 CLINICAL DATA:  Cough and dyspnea- time unknown per pt?  Ex smoker. EXAM: CHEST - 2 VIEW COMPARISON:  CT 05/23/2018  FINDINGS: Right paratracheal mass. Right lower lung atelectasis and collapse. Left lung clear. Heart size upper limits normal. Aortic Atherosclerosis (ICD10-170.0). No effusion. Visualized bones unremarkable. IMPRESSION: 1. Little convincing change in right lower lobe collapse and right paratracheal mass suggesting obstructing lung carcinoma with metastatic adenopathy. Electronically Signed   By: Lucrezia Europe M.D.   On: 07/18/2018 14:50     Assessment & Plan:   Pleasant 67 year old patient seen for follow-up appointment today.  Will get patient scheduled for outpatient bronchoscopy.  We will plan for tentative schedule of 07/31/2018 patient to arrive at 7:15 in the morning at [facility].  We will confirm this tomorrow 07/30/2018 and contact the patient.  As well as contacting her son Celsa Nordahl (son) [phone])  COPD GOLD 0 Prednsone 10mg  tablet  >>>4 tabs for 2 days, then 3 tabs for 2 days, 2 tabs for 2 days, then 1 tab for 2 days, then stop >>>take with food   Continue Pulmicort 2 times daily   Lung mass  Bronchoscopy with Dr. Melvyn Novas on 07/31/2018 >>> Discussed with Dr. Melvyn Novas.  We will call the respiratory department as soon as it opens on 07/30/2018 to get you  scheduled.  Then Dr. Melvyn Novas will call you tomorrow to schedule this appointment.  Plan on: >>>Arrive at [facility] long hospital admitting department at 7:15am  >>>This will be an outpatient procedure >>>Nothing by mouth 12am before        Lauraine Rinne, NP 07/29/2018

## 2018-07-29 NOTE — Assessment & Plan Note (Signed)
Prednsone 10mg  tablet  >>>4 tabs for 2 days, then 3 tabs for 2 days, 2 tabs for 2 days, then 1 tab for 2 days, then stop >>>take with food   Continue Pulmicort 2 times daily

## 2018-07-30 NOTE — H&P (Signed)
Gail Moore, female    DOB: [birth year],     MRN: [id number]   Brief patient profile:  30 yobf quit smoking 2014 with good activity tolerance but bad PVD > bilateral aka in [place] and moved to [place] with new onset raspy voice /sob x 8 weeks > 05/21/18 c/w RLL obstruction so referred to pulmonary clinic 07/18/2018 by Dr   Marco Collie    07/18/2018 Initial pulmonary eval/ Lamira Borin     Chief Complaint  Patient presents with  . Pulmonary Consult    Referred by Dr. Marco Collie. Pt states having SOB and "gets tired easily" for years. She also c/o non prod cough and raspy voice. She uses albuterol neb sol 3 x per wk on average.   Dyspnea:  Ok at rest, in w/c Cough: lots of dry cough mostly daytime  SABA use: just neb/ hfa makes coughing worse    No obvious day to day or daytime variability or assoc excess/ purulent sputum or mucus plugs or hemoptysis or cp or chest tightness, subjective wheeze or overt sinus or hb symptoms.   Sleep: flat on two pillows  without nocturnal  or early am exacerbation  of respiratory  c/o's or need for noct saba. Also denies any obvious fluctuation of symptoms with weather or environmental changes or other aggravating or alleviating factors except as outlined above   No unusual exposure hx or h/o childhood pna/ asthma or knowledge of premature birth.  Current Allergies, Complete Past Medical History, Past Surgical History, Family History, and Social History were reviewed in [employer] record.  ROS  The following are not active complaints unless bolded Hoarseness, sore throat, dysphagia, dental problems, itching, sneezing,  nasal congestion or discharge of excess mucus or purulent secretions, ear ache,   fever, chills, sweats, unintended wt loss or wt gain, classically pleuritic or exertional cp,  orthopnea pnd or arm/hand swelling  or leg swelling, presyncope, palpitations, abdominal pain, anorexia, nausea, vomiting, diarrhea  or change in  bowel habits or change in bladder habits, change in stools or change in urine, dysuria, hematuria,  rash, arthralgias, visual complaints, headache, numbness, weakness or ataxia or problems with walking s/p bilateral aka  or coordination,  change in mood or  memory.            No past medical history on file.        Outpatient Medications Prior to Visit  Medication Sig Dispense Refill  . albuterol (PROVENTIL) (2.5 MG/3ML) 0.083% nebulizer solution Take 2.5 mg by nebulization every 6 (six) hours as needed for wheezing or shortness of breath.    . budesonide (PULMICORT) 0.5 MG/2ML nebulizer solution Take 0.5 mg by nebulization 2 (two) times daily.    . montelukast (SINGULAIR) 10 MG tablet Take 10 mg by mouth at bedtime.     No facility-administered medications prior to visit.             Objective:   BP 106/60 (BP Location: Left Arm, Cuff Size: Normal)   Pulse (!) 104   SpO2 94%   SpO2: 94 %  RA   Top dentures   W/c bound extremely hoarse bf nad at rest   HEENT: nl dentition, turbinates bilaterally, and oropharynx. Nl external ear canals without cough reflex   NECK :  without JVD/Nodes/TM/ nl carotid upstrokes bilaterally   LUNGS: no acc muscle use,  Nl contour chest with prominent upper airway wheezing on insp and exp  CV:  RRR  no s3 or  murmur or increase in P2, and no edema   ABD:  soft and nontender with nl inspiratory excursion in the supine position. No bruits or organomegaly appreciated, bowel sounds nl  MS:    No  cyanosis or clubbing - s/p Bilateral AKAs No obvious joint restrictions   SKIN: warm and dry without lesions    NEURO:  alert, approp, nl sensorium with  no motor or cerebellar deficits apparent.     CXR PA and Lateral:   07/18/2018 :    I personally reviewed images and agree with radiology impression as follows:   Right paratracheal mass. Right lower lung atelectasis and collapse. Left lung clear.       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  See lung mass a/p  COPD GOLD 0  Quit smoking 2014  - Spirometry 07/18/2018  FEV1 1.01 (51%)  Ratio 81 with abn contour f/v s truncation or typical curvature for copd  - 07/18/2018  After extensive coaching inhaler device  effectiveness =    0% and caused choking cessation so changed to bud/performist neb bid and albuterol neb prn   Would like to see if we can improve her breathing prior to FOB though most of her present symptoms of sob are directly related to tumor and not to copd - see doe  Lung mass See CT [place] 05/16/18   cxr today confirms same mass/RLL complete collapse so will need fob for tissue dx as there is likely direct airway involvement and we can also confirm R VC paralysis at that point    Discussed in detail all the  indications, usual  risks and alternatives  relative to the benefits with patient who agrees to proceed with bronchoscopy with biopsy as soon as it can be scheduled  In the next two weeks after we first see if her breathing can be improved with the other option = PET and see if other sites   amenable to bx (though would not give [place] as much info as direct fob)    Total time devoted to counseling  > 50 % of initial 60 min office visit:  review case with pt/daughter Blanch Media  discussion of options/alternatives/ personally creating written customized instructions  in presence of pt  then going over those specific  Instructions directly with the pt including how to use all of the meds  but in particular covering each new medication in detail and the difference between the maintenance= "automatic" meds and the prns using an action plan format for the latter (If this problem/symptom => do that organization reading Left to right).  Please see AVS from this visit for a full list of these instructions which I personally wrote for this pt and  are unique to this visit.      Christinia Gully, MD 07/18/2018         Assessment & Plan Note by Tanda Rockers, MD at 07/19/2018 5:41 AM   Author: Tanda Rockers, MD Author Type: Physician Filed: 07/19/2018  5:45 AM  Note Status: Edited Cosign: Cosign Not Required Encounter Date: 07/18/2018  Problem: Lung mass  Editor: Tanda Rockers, MD (Physician)  Prior Versions: 1. Tanda Rockers, MD (Physician) at 07/19/2018 5:41 AM - Written    See CT [place] 05/16/18   cxr today confirms same mass/RLL complete collapse so will need fob for tissue dx as there is likely direct airway involvement and we can also confirm R VC paralysis at that point    Discussed in detail all the  indications, usual  risks and alternatives  relative to the benefits with patient who agrees to proceed with bronchoscopy with biopsy as soon as it can be scheduled  In the next two weeks after we first see if her breathing can be improved with the other option = PET and see if other sites   amenable to bx (though would not give [place] as much info as direct fob)    Total time devoted to counseling  > 50 % of initial 60 min office visit:  review case with pt/daughter Blanch Media  discussion of options/alternatives/ personally creating written customized instructions  in presence of pt  then going over those specific  Instructions directly with the pt including how to use all of the meds but in particular covering each new medication in detail and the difference between the maintenance= "automatic" meds and the prns using an action plan format for the latter (If this  problem/symptom => do that organization reading Left to right).  Please see AVS from this visit for a full list of these instructions which I personally wrote for this pt and  are unique to this visit.        Assessment & Plan Note by Tanda Rockers, MD at 07/19/2018 5:40 AM   Author: Tanda Rockers, MD Author Type: Physician Filed: 07/19/2018 5:41 AM  Note Status: Written Cosign: Cosign Not Required Encounter Date: 07/18/2018  Problem: COPD GOLD 0  Editor: Tanda Rockers, MD (Physician)     Quit smoking 2014  - Spirometry 07/18/2018  FEV1 1.01 (51%)  Ratio 81 with abn contour f/v s truncation or typical curvature for copd  - 07/18/2018  After extensive coaching inhaler device  effectiveness =    0% and caused choking cessation so changed to bud/performist neb bid and albuterol neb prn   Would like to see if we can improve her breathing prior to FOB though most of her present symptoms of sob are directly related to tumor and not to copd - see doe       Assessment & Plan Note by Tanda Rockers, MD at 07/19/2018 5:33 AM   Author: Tanda Rockers, MD Author Type: Physician Filed: 07/19/2018 5:37 AM  Note Status: Written Cosign: Cosign Not Required Encounter Date: 07/18/2018  Problem: DOE (dyspnea on exertion)  Editor: Tanda Rockers, MD (Physician)    Spirometry 07/18/2018  FEV1 1.01 (51%)  Ratio 81 with abn contour f/v s truncation or typical curvature for copd  - see copd gold 0 a/p   Her symptoms are likely due to 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  See lung mass a/p       Patient Instructions by Tanda Rockers, MD at  07/18/2018 11:30 AM   Author: Tanda Rockers, MD Author Type: Physician Filed: 07/18/2018 12:20 PM  Note Status: Addendum  Cosign: Cosign Not Required Encounter Date: 07/18/2018  Editor: Tanda Rockers, MD (Physician)  Prior Versions: 1. Tanda Rockers, MD (Physician) at 07/18/2018 12:12 PM - Addendum   2. Tanda Rockers, MD (Physician) at 07/18/2018 12:12 PM - Addendum   3. Tanda Rockers, MD (Physician) at 07/18/2018 12:09 PM - Signed    Stop proair   Plan A = Automatic = budesonide twice daily along with  performist    Plan B = Backup Only use your albuterol as a rescue medication to be used if you can't catch your breath     - The less you use it, the better it will work when you need it.b - Ok to use the nebulizer up to  every 4 hours if needed    Prednisone 10 mg take  4 each am x 2 days,   2 each am x 2 days,  1 each am x 2 days and stop    Pantoprazole (protonix) 40 mg   Take  30-60 min before first meal of the day and Pepcid (famotidine)  20 mg one @  bedtime until return to office - this is the best way to tell whether stomach acid is contributing to your problem.     GERD (REFLUX)  is an extremely common cause of respiratory symptoms just like yours , many times with no obvious heartburn at all.    It can be treated with medication, but also with lifestyle changes including elevation of the head of your bed (ideally with 6 inch  bed blocks),  Smoking cessation, avoidance of late meals, excessive alcohol, and avoid fatty foods, chocolate, peppermint, colas, red wine, and acidic juices such as orange juice.  NO MINT OR MENTHOL PRODUCTS SO NO COUGH DROPS   USE SUGARLESS CANDY INSTEAD (Jolley ranchers or Stover's or Life Savers) or even ice chips will also do - the key is to swallow to prevent all throat clearing. NO OIL BASED VITAMINS - use powdered substitutes.      Please remember to go to the  x-ray department downstairs in the basement  for your tests - we will call you with the results when they are available.                 See NP ov 07/29/18. Discussed in detail  all the  indications, usual  risks and alternatives  relative to the benefits with patient who agrees to proceed with bronchoscopy with biopsy   07/31/2018   Day of fob/ no change in hx or exam.    Christinia Gully, MD Pulmonary and [place] [phone] After 5:30 PM or weekends, use [facility] [phone]

## 2018-07-30 NOTE — Progress Notes (Signed)
Chart and office note reviewed in detail  > agree with a/p as outlined> fob next step to find out what is causing the RLL atx but very likely this is unresectable lung ca

## 2018-07-31 ENCOUNTER — Ambulatory Visit (HOSPITAL_COMMUNITY)
Admission: RE | Admit: 2018-07-31 | Discharge: 2018-07-31 | Disposition: A | Discharge status: Home / Self Care | Payer: Medicare Other | Source: Ambulatory Visit | Attending: Internal Medicine | Admitting: Internal Medicine

## 2018-07-31 ENCOUNTER — Telehealth: Payer: Self-pay | Admitting: Internal Medicine

## 2018-07-31 ENCOUNTER — Encounter (HOSPITAL_COMMUNITY)
Admission: RE | Disposition: A | Discharge status: Home / Self Care | Payer: Self-pay | Source: Ambulatory Visit | Attending: Internal Medicine

## 2018-07-31 DIAGNOSIS — C3490 Malignant neoplasm of unspecified part of unspecified bronchus or lung: Secondary | ICD-10-CM | POA: Diagnosis not present

## 2018-07-31 DIAGNOSIS — I739 Peripheral vascular disease, unspecified: Secondary | ICD-10-CM | POA: Insufficient documentation

## 2018-07-31 DIAGNOSIS — Z89612 Acquired absence of left leg above knee: Secondary | ICD-10-CM | POA: Insufficient documentation

## 2018-07-31 DIAGNOSIS — Z7951 Long term (current) use of inhaled steroids: Secondary | ICD-10-CM | POA: Insufficient documentation

## 2018-07-31 DIAGNOSIS — Z87891 Personal history of nicotine dependence: Secondary | ICD-10-CM | POA: Insufficient documentation

## 2018-07-31 DIAGNOSIS — Z89611 Acquired absence of right leg above knee: Secondary | ICD-10-CM | POA: Insufficient documentation

## 2018-07-31 DIAGNOSIS — R918 Other nonspecific abnormal finding of lung field: Secondary | ICD-10-CM

## 2018-07-31 DIAGNOSIS — J40 Bronchitis, not specified as acute or chronic: Secondary | ICD-10-CM | POA: Diagnosis not present

## 2018-07-31 DIAGNOSIS — C3431 Malignant neoplasm of lower lobe, right bronchus or lung: Secondary | ICD-10-CM | POA: Insufficient documentation

## 2018-07-31 DIAGNOSIS — J449 Chronic obstructive pulmonary disease, unspecified: Secondary | ICD-10-CM | POA: Diagnosis not present

## 2018-07-31 HISTORY — PX: VIDEO BRONCHOSCOPY: SHX5072

## 2018-07-31 SURGERY — VIDEO BRONCHOSCOPY WITHOUT FLUORO
Anesthesia: Moderate Sedation | Laterality: Bilateral

## 2018-07-31 MED ORDER — PHENYLEPHRINE HCL 0.25 % NA SOLN: 1.0000 | Freq: Four times a day (QID) | NASAL | Status: DC | PRN

## 2018-07-31 MED ORDER — LIDOCAINE HCL URETHRAL/MUCOSAL 2 % EX GEL
CUTANEOUS | Status: DC | PRN
  Administered 2018-07-31: 1

## 2018-07-31 MED ORDER — LIDOCAINE HCL 2 % EX GEL
1.0000 "application " | Freq: Once | CUTANEOUS | Status: DC
  Filled 2018-07-31: qty 5

## 2018-07-31 MED ORDER — MEPERIDINE HCL 25 MG/ML IJ SOLN
INTRAMUSCULAR | Status: DC | PRN
  Administered 2018-07-31: 25 mg via INTRAVENOUS

## 2018-07-31 MED ORDER — MEPERIDINE HCL 100 MG/ML IJ SOLN: 100.0000 mg | Freq: Once | INTRAMUSCULAR | Status: DC

## 2018-07-31 MED ORDER — PHENYLEPHRINE HCL 0.25 % NA SOLN
NASAL | Status: DC | PRN
  Administered 2018-07-31: 2 via NASAL

## 2018-07-31 MED ORDER — MIDAZOLAM HCL 5 MG/ML IJ SOLN
INTRAMUSCULAR | Status: AC
  Filled 2018-07-31: qty 2

## 2018-07-31 MED ORDER — MIDAZOLAM HCL 5 MG/ML IJ SOLN: 1.0000 mg | Freq: Once | INTRAMUSCULAR | Status: DC

## 2018-07-31 MED ORDER — LIDOCAINE HCL (PF) 1 % IJ SOLN
INTRAMUSCULAR | Status: DC | PRN
  Administered 2018-07-31: 6 mL

## 2018-07-31 MED ORDER — MEPERIDINE HCL 100 MG/ML IJ SOLN
INTRAMUSCULAR | Status: AC
Start: 2018-07-31 — End: ?
  Filled 2018-07-31: qty 2

## 2018-07-31 MED ORDER — MIDAZOLAM HCL 10 MG/2ML IJ SOLN
INTRAMUSCULAR | Status: DC | PRN
  Administered 2018-07-31: 2.5 mg via INTRAVENOUS

## 2018-07-31 MED ORDER — SODIUM CHLORIDE 0.9 % IV SOLN
INTRAVENOUS | Status: DC
  Administered 2018-07-31: 08:00:00 via INTRAVENOUS

## 2018-07-31 NOTE — Telephone Encounter (Signed)
Pt had a bronch today and son Donalda Ewings had to drive pt to and from procedure- pt's son requesting a work note.  This has been typed and placed up front for pickup.  Nothing further needed.

## 2018-07-31 NOTE — Progress Notes (Signed)
Video bronchoscopy performed Intervention bronchial washings Intervention bronchial biopsies Patient tolerated well  Kathie Dike RRT

## 2018-07-31 NOTE — Op Note (Addendum)
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 The risks, benefits, complications, treatment options and expected outcomes were discussed with the patient.  The possibilities of reaction to medication, pulmonary aspiration, perforation of a viscus, bleeding, failure to diagnose a condition and creating a complication requiring transfusion or operation were discussed with the patient who freely signed the consent.    Description of Procedure: The patient was re-examined in the bronchoscopy suite and the site of surgery properly noted/marked.  The patient was identified  and the procedure verified as Flexible Fiberoptic Bronchoscopy.  A Time Out was held and the above information confirmed.   After the induction of topical nasopharyngeal anesthesia, the patient was positioned  and the bronchoscope was passed through the R naris. The vocal cords were visualized and  1% buffered lidocaine 5 ml was topically placed onto the cords. The cords were ? slt sluggish R true Vocal cord. The scope was then passed into the trachea.  1% buffered lidocaine given topically. Airways inspected bilaterally to the subsegmental level with the following findings:  Airways nl except    Interventions:  BAL/ endobronchial bx/ washings post bx all sent      The Patient was taken to the Endoscopy Recovery area in satisfactory  condition.  Attestation: I performed the procedure.  MChristinia Gully MD Pulmonary and [phone] 5:30 PM or weekends, call [phone]

## 2018-07-31 NOTE — Discharge Instructions (Signed)
Flexible Bronchoscopy, Care After These instructions give you information on caring for yourself after your procedure. Your doctor may also give you more specific instructions. Call your doctor if you have any problems or questions after your procedure. Follow these instructions at home:  Do not eat or drink anything for 2 hours after your procedure. If you try to eat or drink before the medicine wears off, food or drink could go into your lungs. You could also burn yourself.  After 2 hours have passed and when you can cough and gag normally, you may eat soft food and drink liquids slowly.  The day after the test, you may eat your normal diet.  You may do your normal activities.  Keep all doctor visits. Get help right away if:  You get more and more short of breath.  You get light-headed.  You feel like you are going to pass out (faint).  You have chest pain.  You have new problems that worry you.  You cough up more than a little blood.  You cough up more blood than before. This information is not intended to replace advice given to you by your health care provider. Make sure you discuss any questions you have with your health care provider. Document Released: 10/07/2009 Document Revised: 05/17/2016 Document Reviewed: 08/14/2013 Elsevier Interactive Patient Education  2017 [place] not eat or drink until after 1030  today 07/31/18

## 2018-08-01 ENCOUNTER — Telehealth: Payer: Self-pay | Admitting: Internal Medicine

## 2018-08-01 ENCOUNTER — Encounter (HOSPITAL_COMMUNITY): Payer: Self-pay | Admitting: Internal Medicine

## 2018-08-01 DIAGNOSIS — R918 Other nonspecific abnormal finding of lung field: Secondary | ICD-10-CM

## 2018-08-01 NOTE — Telephone Encounter (Signed)
Called and spoke with patients son, he is requesting results from bx.    MW please advise, thank you.

## 2018-08-01 NOTE — Telephone Encounter (Signed)
Attempted to call patients son today regarding results. I did not receive an answer at time of call. I have left a voicemail message for pt to return call. X1

## 2018-08-01 NOTE — Telephone Encounter (Signed)
Discussed with pt dx of tumor that needs to be further characterized for tissue type but was maligant    Please refer to Dr Laury Deep with copy of path report to her and to Dr Marco Collie

## 2018-08-04 ENCOUNTER — Other Ambulatory Visit: Payer: Self-pay | Admitting: Internal Medicine

## 2018-08-04 ENCOUNTER — Telehealth: Payer: Self-pay | Admitting: Internal Medicine

## 2018-08-04 DIAGNOSIS — R918 Other nonspecific abnormal finding of lung field: Secondary | ICD-10-CM

## 2018-08-04 NOTE — Telephone Encounter (Signed)
Called and spoke with pt's son Spenc regarding MW recommendations Spenc verbalized understanding, and had no further questions regarding referral today. Placed referral to Dr. Hosie Poisson MD oncologist in [place] a note that the path report is needs to be sent with referral Faxed copy of path report to PCP Dr. Marco Collie today to office of [facility] practice Phone [phone] fax [phone]; fax confirmation went through Nothing further needed at this time.

## 2018-08-04 NOTE — Telephone Encounter (Signed)
Patient's son has been made aware of results earlier today. Will close this encounter.

## 2018-08-06 ENCOUNTER — Telehealth: Payer: Self-pay | Admitting: Internal Medicine

## 2018-08-06 ENCOUNTER — Telehealth: Payer: Self-pay | Admitting: Pharmacy Technician

## 2018-08-06 MED ORDER — PREDNISONE 10 MG PO TABS: ORAL_TABLET | ORAL | 0 refills | Status: DC

## 2018-08-06 NOTE — Telephone Encounter (Signed)
forms have been received and placed in MW's folder.   Will route to [facility]

## 2018-08-06 NOTE — Telephone Encounter (Signed)
Called and spoke to [place] with pathology.  Suanne Marker states that she faxed over forms to be signed by MW. Suanne Marker requested that forms be faxed back to [phone], and include staging and ICD code.   I have checked MW's lookout and folder up front, it does not appear that forms have been received.  I have spoken to [facility] with pathology, and requested that forms be re faxed. Will hold message in triage until forms are received.

## 2018-08-06 NOTE — Telephone Encounter (Signed)
Prednisone 10- mg 2 until better then 1 daily #100

## 2018-08-06 NOTE — Telephone Encounter (Signed)
Called and spoke with pt regarding MW recommendations and rx request Placed order for prednisone 20mg  daily until better, then one 10mg  daily #100 tabs Pt verbalized understanding, and had no further concerns Nothing further needed.

## 2018-08-06 NOTE — Telephone Encounter (Addendum)
Called and spoke to [place]). Pt states pt completed prednisone taper that was prescribed during 07/29/18 OV Spence is requesting to extend prednisone taper, as pt's breathing improved with prednisone. Pt is now experiencing increased sob, voiced hoarseness & non prod cough. Taking albuterol neb bid with no improvement.     MW please advise. Thanks   07/29/18 AVS:  Editor: Lauraine Rinne, NP (Nurse Practitioner)     Bronchoscopy with Dr. Melvyn Novas on 07/31/2018 >>> Discussed with Dr. Melvyn Novas.  We will call the respiratory department as soon as it opens on 07/30/2018 to get you scheduled.  Then Dr. Melvyn Novas will call you tomorrow to schedule this appointment.  Plan on: >>>Arrive at [facility] long hospital admitting department at 7:15am  >>>This will be an outpatient procedure >>>Nothing by mouth 12am before

## 2018-08-06 NOTE — Telephone Encounter (Signed)
Form placed in Dr Gustavus Bryant lookat

## 2018-08-07 NOTE — Telephone Encounter (Signed)
This encounter was opened in error.

## 2018-08-08 NOTE — Telephone Encounter (Signed)
Routing message to MW to please advise once paperwork is completed. Thank you.

## 2018-08-08 NOTE — Telephone Encounter (Signed)
[place], is requesting the ICD 10 to contiune with testing for this pt. Cb is [phone] [id number]

## 2018-08-12 DIAGNOSIS — Z803 Family history of malignant neoplasm of breast: Secondary | ICD-10-CM

## 2018-08-12 DIAGNOSIS — R978 Other abnormal tumor markers: Secondary | ICD-10-CM | POA: Diagnosis not present

## 2018-08-12 DIAGNOSIS — I739 Peripheral vascular disease, unspecified: Secondary | ICD-10-CM | POA: Diagnosis not present

## 2018-08-12 DIAGNOSIS — D649 Anemia, unspecified: Secondary | ICD-10-CM | POA: Diagnosis not present

## 2018-08-12 DIAGNOSIS — Z89612 Acquired absence of left leg above knee: Secondary | ICD-10-CM

## 2018-08-12 DIAGNOSIS — Z7952 Long term (current) use of systemic steroids: Secondary | ICD-10-CM

## 2018-08-12 DIAGNOSIS — Z7289 Other problems related to lifestyle: Secondary | ICD-10-CM

## 2018-08-12 DIAGNOSIS — E876 Hypokalemia: Secondary | ICD-10-CM | POA: Diagnosis not present

## 2018-08-12 DIAGNOSIS — Z7901 Long term (current) use of anticoagulants: Secondary | ICD-10-CM | POA: Diagnosis not present

## 2018-08-12 DIAGNOSIS — E538 Deficiency of other specified B group vitamins: Secondary | ICD-10-CM | POA: Diagnosis not present

## 2018-08-12 DIAGNOSIS — Z8042 Family history of malignant neoplasm of prostate: Secondary | ICD-10-CM

## 2018-08-12 DIAGNOSIS — Z7951 Long term (current) use of inhaled steroids: Secondary | ICD-10-CM

## 2018-08-12 DIAGNOSIS — J449 Chronic obstructive pulmonary disease, unspecified: Secondary | ICD-10-CM | POA: Diagnosis not present

## 2018-08-12 DIAGNOSIS — C3431 Malignant neoplasm of lower lobe, right bronchus or lung: Secondary | ICD-10-CM | POA: Diagnosis not present

## 2018-08-12 DIAGNOSIS — Z87891 Personal history of nicotine dependence: Secondary | ICD-10-CM

## 2018-08-12 DIAGNOSIS — Z89511 Acquired absence of right leg below knee: Secondary | ICD-10-CM

## 2018-08-12 NOTE — Telephone Encounter (Signed)
Called and spoke with Clyde Canterbury at [place] at [phone] ext [phone] She is needing ICD code that begins with C for the patient to cont testing Advised her that the patient diagnosis begins with J, she said that will not work MW provided the primary diagnosis of non small cell lung cancer stage 4- ICD Code was C34.90 Clyde Canterbury verbalized understanding, and nothing further needed.

## 2018-08-13 DIAGNOSIS — I2510 Atherosclerotic heart disease of native coronary artery without angina pectoris: Secondary | ICD-10-CM | POA: Diagnosis not present

## 2018-08-13 DIAGNOSIS — I700 Atherosclerosis of aorta: Secondary | ICD-10-CM | POA: Diagnosis not present

## 2018-08-13 DIAGNOSIS — C3431 Malignant neoplasm of lower lobe, right bronchus or lung: Secondary | ICD-10-CM | POA: Diagnosis not present

## 2018-08-13 DIAGNOSIS — E278 Other specified disorders of adrenal gland: Secondary | ICD-10-CM | POA: Diagnosis not present

## 2018-08-15 ENCOUNTER — Telehealth: Payer: Self-pay | Admitting: Internal Medicine

## 2018-08-15 DIAGNOSIS — C3491 Malignant neoplasm of unspecified part of right bronchus or lung: Secondary | ICD-10-CM | POA: Diagnosis not present

## 2018-08-15 DIAGNOSIS — C3431 Malignant neoplasm of lower lobe, right bronchus or lung: Secondary | ICD-10-CM | POA: Diagnosis not present

## 2018-08-15 NOTE — Telephone Encounter (Signed)
Called and spoke with patients son, he was wanting to see if patient could be seen at an earlier time on Monday. Advised patients son that MW was already full. Offered to change the appointment to another day but he wants to keep this one. Nothing further needed.

## 2018-08-16 ENCOUNTER — Encounter (HOSPITAL_COMMUNITY): Payer: Self-pay

## 2018-08-16 ENCOUNTER — Other Ambulatory Visit: Payer: Self-pay

## 2018-08-16 ENCOUNTER — Telehealth: Payer: Self-pay | Admitting: Family Medicine

## 2018-08-16 ENCOUNTER — Inpatient Hospital Stay (HOSPITAL_COMMUNITY)
Admission: AD | Admit: 2018-08-16 | Discharge: 2018-08-23 | DRG: 871 | Disposition: A | Discharge status: Home Health | Payer: Medicare Other | Source: Other Acute Inpatient Hospital | Attending: Internal Medicine | Admitting: Internal Medicine

## 2018-08-16 DIAGNOSIS — D630 Anemia in neoplastic disease: Secondary | ICD-10-CM | POA: Diagnosis present

## 2018-08-16 DIAGNOSIS — K219 Gastro-esophageal reflux disease without esophagitis: Secondary | ICD-10-CM | POA: Diagnosis present

## 2018-08-16 DIAGNOSIS — R531 Weakness: Secondary | ICD-10-CM | POA: Diagnosis not present

## 2018-08-16 DIAGNOSIS — J181 Lobar pneumonia, unspecified organism: Secondary | ICD-10-CM | POA: Diagnosis present

## 2018-08-16 DIAGNOSIS — R0602 Shortness of breath: Secondary | ICD-10-CM

## 2018-08-16 DIAGNOSIS — R0603 Acute respiratory distress: Secondary | ICD-10-CM | POA: Diagnosis not present

## 2018-08-16 DIAGNOSIS — E871 Hypo-osmolality and hyponatremia: Secondary | ICD-10-CM | POA: Diagnosis not present

## 2018-08-16 DIAGNOSIS — Z89511 Acquired absence of right leg below knee: Secondary | ICD-10-CM

## 2018-08-16 DIAGNOSIS — R590 Localized enlarged lymph nodes: Secondary | ICD-10-CM | POA: Diagnosis present

## 2018-08-16 DIAGNOSIS — Z6831 Body mass index (BMI) 31.0-31.9, adult: Secondary | ICD-10-CM

## 2018-08-16 DIAGNOSIS — Z7951 Long term (current) use of inhaled steroids: Secondary | ICD-10-CM

## 2018-08-16 DIAGNOSIS — C3490 Malignant neoplasm of unspecified part of unspecified bronchus or lung: Secondary | ICD-10-CM | POA: Diagnosis present

## 2018-08-16 DIAGNOSIS — Z89611 Acquired absence of right leg above knee: Secondary | ICD-10-CM

## 2018-08-16 DIAGNOSIS — Y95 Nosocomial condition: Secondary | ICD-10-CM | POA: Diagnosis not present

## 2018-08-16 DIAGNOSIS — E876 Hypokalemia: Secondary | ICD-10-CM | POA: Diagnosis not present

## 2018-08-16 DIAGNOSIS — J189 Pneumonia, unspecified organism: Secondary | ICD-10-CM | POA: Diagnosis not present

## 2018-08-16 DIAGNOSIS — A419 Sepsis, unspecified organism: Secondary | ICD-10-CM | POA: Diagnosis not present

## 2018-08-16 DIAGNOSIS — E43 Unspecified severe protein-calorie malnutrition: Secondary | ICD-10-CM

## 2018-08-16 DIAGNOSIS — J440 Chronic obstructive pulmonary disease with acute lower respiratory infection: Secondary | ICD-10-CM | POA: Diagnosis present

## 2018-08-16 DIAGNOSIS — Z87891 Personal history of nicotine dependence: Secondary | ICD-10-CM

## 2018-08-16 DIAGNOSIS — Z808 Family history of malignant neoplasm of other organs or systems: Secondary | ICD-10-CM | POA: Diagnosis not present

## 2018-08-16 DIAGNOSIS — Z89512 Acquired absence of left leg below knee: Secondary | ICD-10-CM | POA: Diagnosis not present

## 2018-08-16 DIAGNOSIS — Z89612 Acquired absence of left leg above knee: Secondary | ICD-10-CM | POA: Diagnosis not present

## 2018-08-16 DIAGNOSIS — Z923 Personal history of irradiation: Secondary | ICD-10-CM | POA: Diagnosis not present

## 2018-08-16 DIAGNOSIS — Z7952 Long term (current) use of systemic steroids: Secondary | ICD-10-CM | POA: Diagnosis not present

## 2018-08-16 DIAGNOSIS — R000 Tachycardia, unspecified: Secondary | ICD-10-CM | POA: Diagnosis present

## 2018-08-16 DIAGNOSIS — I739 Peripheral vascular disease, unspecified: Secondary | ICD-10-CM | POA: Diagnosis present

## 2018-08-16 DIAGNOSIS — J984 Other disorders of lung: Secondary | ICD-10-CM | POA: Diagnosis not present

## 2018-08-16 DIAGNOSIS — J9601 Acute respiratory failure with hypoxia: Secondary | ICD-10-CM | POA: Diagnosis not present

## 2018-08-16 DIAGNOSIS — Z79899 Other long term (current) drug therapy: Secondary | ICD-10-CM

## 2018-08-16 DIAGNOSIS — T17908S Unspecified foreign body in respiratory tract, part unspecified causing other injury, sequela: Secondary | ICD-10-CM | POA: Diagnosis not present

## 2018-08-16 DIAGNOSIS — C342 Malignant neoplasm of middle lobe, bronchus or lung: Secondary | ICD-10-CM | POA: Diagnosis not present

## 2018-08-16 DIAGNOSIS — R0902 Hypoxemia: Secondary | ICD-10-CM | POA: Diagnosis not present

## 2018-08-16 DIAGNOSIS — C3491 Malignant neoplasm of unspecified part of right bronchus or lung: Secondary | ICD-10-CM | POA: Diagnosis not present

## 2018-08-16 DIAGNOSIS — R509 Fever, unspecified: Secondary | ICD-10-CM | POA: Diagnosis not present

## 2018-08-16 DIAGNOSIS — J188 Other pneumonia, unspecified organism: Secondary | ICD-10-CM | POA: Diagnosis not present

## 2018-08-16 DIAGNOSIS — C3431 Malignant neoplasm of lower lobe, right bronchus or lung: Secondary | ICD-10-CM | POA: Diagnosis not present

## 2018-08-16 HISTORY — DX: Unspecified asthma, uncomplicated: J45.909

## 2018-08-16 HISTORY — DX: Peripheral vascular disease, unspecified: I73.9

## 2018-08-16 LAB — BASIC METABOLIC PANEL
Anion gap: 7 (ref 5–15)
CHLORIDE: 107 mmol/L (ref 98–111)
CO2: 25 mmol/L (ref 22–32)
CREATININE: 0.52 mg/dL (ref 0.44–1.00)
Calcium: 7 mg/dL — ABNORMAL LOW (ref 8.9–10.3)
GFR calc Af Amer: >60 mL/min (ref >60)
Glucose, Bld: 137 mg/dL — ABNORMAL HIGH (ref 70–99)
Potassium: 2.8 mmol/L — ABNORMAL LOW (ref 3.5–5.1)
SODIUM: 139 mmol/L (ref 135–145)

## 2018-08-16 LAB — MRSA PCR SCREENING: MRSA by PCR: NEGATIVE

## 2018-08-16 MED ORDER — MONTELUKAST SODIUM 10 MG PO TABS
10.0000 mg | ORAL_TABLET | Freq: Every day | ORAL | Status: DC
  Administered 2018-08-16 – 2018-08-22 (×7): 10 mg via ORAL
  Filled 2018-08-16 (×7): qty 1

## 2018-08-16 MED ORDER — PREDNISONE 10 MG PO TABS
10.0000 mg | ORAL_TABLET | Freq: Every day | ORAL | Status: DC
  Administered 2018-08-17 – 2018-08-21 (×5): 10 mg via ORAL
  Filled 2018-08-16 (×6): qty 1

## 2018-08-16 MED ORDER — ALBUTEROL SULFATE (2.5 MG/3ML) 0.083% IN NEBU
2.5000 mg | INHALATION_SOLUTION | Freq: Four times a day (QID) | RESPIRATORY_TRACT | Status: DC | PRN
  Administered 2018-08-18 – 2018-08-20 (×2): 2.5 mg via RESPIRATORY_TRACT
  Filled 2018-08-16 (×2): qty 3

## 2018-08-16 MED ORDER — VANCOMYCIN HCL 500 MG IV SOLR
500.0000 mg | Freq: Two times a day (BID) | INTRAVENOUS | Status: DC
  Administered 2018-08-17: 500 mg via INTRAVENOUS
  Filled 2018-08-16 (×2): qty 500

## 2018-08-16 MED ORDER — ENOXAPARIN SODIUM 40 MG/0.4ML ~~LOC~~ SOLN
40.0000 mg | SUBCUTANEOUS | Status: DC
  Administered 2018-08-16 – 2018-08-22 (×6): 40 mg via SUBCUTANEOUS
  Filled 2018-08-16 (×6): qty 0.4

## 2018-08-16 MED ORDER — ARFORMOTEROL TARTRATE 15 MCG/2ML IN NEBU
15.0000 ug | INHALATION_SOLUTION | Freq: Two times a day (BID) | RESPIRATORY_TRACT | Status: DC
  Administered 2018-08-16 – 2018-08-23 (×14): 15 ug via RESPIRATORY_TRACT
  Filled 2018-08-16 (×15): qty 2

## 2018-08-16 MED ORDER — SODIUM CHLORIDE 0.9 % IV SOLN
1.0000 g | Freq: Three times a day (TID) | INTRAVENOUS | Status: DC
  Administered 2018-08-16 – 2018-08-23 (×19): 1 g via INTRAVENOUS
  Filled 2018-08-16 (×23): qty 1

## 2018-08-16 MED ORDER — VANCOMYCIN HCL IN DEXTROSE 1-5 GM/200ML-% IV SOLN
1000.0000 mg | Freq: Once | INTRAVENOUS | Status: AC
  Administered 2018-08-16: 1000 mg via INTRAVENOUS
  Filled 2018-08-16: qty 200

## 2018-08-16 MED ORDER — BUDESONIDE 0.5 MG/2ML IN SUSP
0.5000 mg | Freq: Two times a day (BID) | RESPIRATORY_TRACT | Status: DC
  Administered 2018-08-16 – 2018-08-23 (×14): 0.5 mg via RESPIRATORY_TRACT
  Filled 2018-08-16 (×15): qty 2

## 2018-08-16 MED ORDER — PANTOPRAZOLE SODIUM 40 MG PO TBEC
40.0000 mg | DELAYED_RELEASE_TABLET | Freq: Every day | ORAL | Status: DC
  Administered 2018-08-17 – 2018-08-23 (×7): 40 mg via ORAL
  Filled 2018-08-16 (×7): qty 1

## 2018-08-16 MED ORDER — FAMOTIDINE 20 MG PO TABS
20.0000 mg | ORAL_TABLET | Freq: Every day | ORAL | Status: DC
  Administered 2018-08-16 – 2018-08-22 (×7): 20 mg via ORAL
  Filled 2018-08-16 (×7): qty 1

## 2018-08-16 MED ORDER — POTASSIUM CHLORIDE IN NACL 20-0.9 MEQ/L-% IV SOLN
INTRAVENOUS | Status: AC
  Administered 2018-08-16: 20:00:00 via INTRAVENOUS
  Filled 2018-08-16: qty 1000

## 2018-08-16 NOTE — Telephone Encounter (Signed)
Called by PA in ED from [facility] ED. 67 yo with hx of non small cell lung cancer, COPD, diverticulosis, PVD s/p bilateral LE amputations presenting to ED with weakness, fever, and increased wob.  Found to have post obstructive pneumonia.  CT notable for "large poorly marginated central right lower lung neoplasm occluding the right middle lobe and right lower lobe bronchi with complete postobstructive right middle and right lower lobe pneumonia".  Labs notable for WBC 25, Hb 10.5.  Na 134, K 2.6, Ca 7.2.  Most recent vitals notable for HR 93, BP 106/55, satting well on 2 L by [place].  Blood cultures drawn and pending.  Procalcitonin 0.8.  S/p 3.5 L bolus.  S/p vanc/zosyn.  Plan to admit for pneumonia.  Consider pulmonology c/s.

## 2018-08-16 NOTE — Progress Notes (Signed)
Pt admitted to 2w-[address] from [facility] ED via [place].  Upon arrival, pt alert, oriented x4. Skin intact.  Pt denies pain. Assessment completed.  Son and daughter-in-law at bedside.  Rings sent home with son.  Pt in no acute distress.

## 2018-08-16 NOTE — Progress Notes (Signed)
Pharmacy Antibiotic Note  Gail Moore is a 67 y.o. female admitted on 08/16/2018 with pneumonia.  Pharmacy has been consulted for vancomycin dosing (patient on cefepime) -SCr= 0.5, CrCl ~ 60-70   Plan: -Vancomycin 500mg  IV q12h (reduced dose due to low weight) -Will follow renal function, cultures and clinical progress   Weight: 121 lb 4.1 oz (55 kg)  Temp (24hrs), Avg:98.3 F (36.8 C), Min:98.3 F (36.8 C), Max:98.3 F (36.8 C)  No results for input(s): WBC, CREATININE, LATICACIDVEN, VANCOTROUGH, VANCOPEAK, VANCORANDOM, GENTTROUGH, GENTPEAK, GENTRANDOM, TOBRATROUGH, TOBRAPEAK, TOBRARND, AMIKACINPEAK, AMIKACINTROU, AMIKACIN in the last 168 hours.  CrCl cannot be calculated (No successful lab value found.).    No Known Allergies    Thank you for allowing pharmacy to be a part of this patient's care.  Hildred Laser, PharmD Clinical Pharmacist Please check Amion for pharmacy contact number  e

## 2018-08-16 NOTE — H&P (Signed)
TRH H&P   Patient Demographics:    Gail Moore, is a 67 y.o. female  MRN: [id number]   DOB - [birth year]  Admit Date - 08/16/2018  Outpatient Primary MD for the patient is Marco Collie, MD  Referring MD/NP/PA:   [facility]  Outpatient Specialists:  Christinia Gully  Patient coming from:  [facility] ED  No chief complaint on file.  fever, sob   HPI:    Gail Moore  is a 67 y.o. female, w hx of nonsmall cell lung cancer, Copd, PVD s/p bilateral AKA, apparently presents with c/o increase in cough, nonproductive, fever for the past day as well as increase in dyspnea.   Pt denies cp, palp, n/v, diarrhea, brbpr, black stool.   In Ed,  T 100.2 P 96 (pulse was as high as 112) R 18, Bp 96/53  Pox 99% on 2L [place]  Wbc 25.2, Hgb 10.5 Plt 289 Na 134, K 2.6, Bun 7, Creatinine 0.4,  Glucose 173  Abg: ra, ph 7.580, Pco2 32, Po2 49  Trop <0.01  Lactic acid 1.8 (high) Procalclitonin 0.8 (high)  CXR Impression 1. Dense right lower lung consolidation, increased from prior chest radiograph, compatible with hypermetabolic tumor and post-obstructive pneumonia 2. Prominent right paratracheal mass compatible with metastatic adenoapthy , unchanged 3. Small right pleural effusion.  CT chest Impression 1. No PE 2. Large poorly marginated dentral right lower lung neoplasm occluding the right middle lobe and right lower lobe bronchi with complete post-obstructive right middle and right lower lobe pneumonia , compatible with reported history of primary bronchogenic malignancy 3. Bulky right hilar, subcarinal and right paratracheal nodal metastasis 4. Findings are unchanged from recent PET-CT  5. Mild asymmetric, interlobular septal thicking thoughtout the right lung could represent asymmetric, pulmonary edema or lymphangitis spread 6. 3 vessel coronary atherosclerosis   Pt will be admitted for  acute hypoxic resp failure secondary to post-obstructive pneumonia, Hcap, and severe hypokalemia.        Review of systems:    In addition to the HPI above,  No Fever-chills, No Headache, No changes with Vision or hearing, No problems swallowing food or Liquids, No Chest pain,  No Abdominal pain, No Nausea or Vommitting, Bowel movements are regular, No Blood in stool or Urine, No dysuria, No new skin rashes or bruises, No new joints pains-aches,  No new weakness, tingling, numbness in any extremity, No recent weight gain or loss, No polyuria, polydypsia or polyphagia, No significant Mental Stressors.  A full 10 point Review of Systems was done, except as stated above, all other Review of Systems were negative.   With Past History of the following :    Past Medical History:  Diagnosis Date  . Asthma   . Peripheral vascular disease [facility])       Past Surgical History:  Procedure Laterality Date  . ABOVE KNEE LEG AMPUTATION Bilateral   .  APPENDECTOMY    . COLON SURGERY    . VIDEO BRONCHOSCOPY Bilateral 07/31/2018   Procedure: VIDEO BRONCHOSCOPY WITHOUT FLUORO;  Surgeon: Tanda Rockers, MD;  Location: [facility] END[facility]COPY;  Service: Cardiopulmonary;  Laterality: Bilateral;      Social History:     Social History   Tobacco Use  . Smoking status: Former Smoker    Packs/day: 0.50    Years: 15.00    Pack years: 7.50    Types: Cigarettes    Last attempt to quit: 12/24/2012    Years since quitting: 5.6  . Smokeless tobacco: Never Used  Substance Use Topics  . Alcohol use: Not Currently    Alcohol/week: 4.0 standard drinks    Types: 4 Cans of beer per week    Comment: per week      Lives - at home  Mobility - walks by self  Family History :     Family History  Problem Relation Age of Onset  . Breast cancer Mother   . Breast cancer Father   . Prostate cancer Father        Home Medications:   Prior to Admission medications   Medication Sig Start Date End Date  Taking? Authorizing Provider  albuterol (PROVENTIL) (2.5 MG/3ML) 0.083% nebulizer solution Take 2.5 mg by nebulization every 6 (six) hours as needed for wheezing or shortness of breath.    [provider]  budesonide (PULMICORT) 0.5 MG/2ML nebulizer solution Take 0.5 mg by nebulization 2 (two) times daily.    [provider]  famotidine (PEPCID) 20 MG tablet One at bedtime Patient taking differently: Take 20 mg by mouth at bedtime.  07/18/18   Tanda Rockers, MD  formoterol (PERFOROMIST) 20 MCG/2ML nebulizer solution Take 20 mcg by nebulization 2 (two) times daily.    [provider]  montelukast (SINGULAIR) 10 MG tablet Take 10 mg by mouth at bedtime.    [provider]  pantoprazole (PROTONIX) 40 MG tablet Take 1 tablet (40 mg total) by mouth daily. Take 30-60 min before first meal of the day 07/18/18   Tanda Rockers, MD  predniSONE (DELTASONE) 10 MG tablet 4 tabs for 2 days, then 3 tabs for 2 days, 2 tabs for 2 days, then 1 tab for 2 days, then stop Patient taking differently: Take 10-40 mg by mouth See admin instructions. Take 40 mg by mouth daily for 2 days, then 30 mg by mouth daily for 2 days, then 20 mg by mouth daily for 2 days, then 10 mg by mouth daily for 2 days, then stop 07/29/18   Lauraine Rinne, NP  predniSONE (DELTASONE) 10 MG tablet Take 20mg  daily until better, then 1 daily. 08/06/18   Tanda Rockers, MD     Allergies:    No Known Allergies   Physical Exam:   Vitals  Blood pressure (!) 108/56, pulse 91, temperature (!) 97.5 F (36.4 C), temperature source Oral, resp. rate 14, weight 55 kg, SpO2 96 %.   1. General  lying in bed in NAD,    2. Normal affect and insight, Not Suicidal or Homicidal, Awake Alert, Oriented X 3.  3. No F.N deficits, ALL C.Nerves Intact, Strength 5/5 all 4 extremities, Sensation intact all 4 extremities, Plantars down going.  4. Ears and Eyes appear Normal, Conjunctivae clear, PERRLA. Moist Oral Mucosa.  5.  Supple Neck, No JVD, No cervical lymphadenopathy appriciated, No Carotid Bruits.  6. Symmetrical Chest wall movement,   Decrease in bs right  lung base,  Crackles at right lung base, no wheezing.  7. RRR, No Gallops, Rubs or Murmurs, No Parasternal Heave.  8. Positive Bowel Sounds, Abdomen Soft, No tenderness, No organomegaly appriciated,No rebound -guarding or rigidity.  9.  No Cyanosis, Normal Skin Turgor, No Skin Rash or Bruise.  10. Good muscle tone,  joints appear normal , no effusions, Normal ROM.  11. No Palpable Lymph Nodes in Neck or Axillae     Data Review:    CBC No results for input(s): WBC, HGB, HCT, PLT, MCV, MCH, MCHC, RDW, LYMPHSABS, MONOABS, EOSABS, BASOSABS, BANDABS in the last 168 hours.  Invalid input(s): NEUTRABS, BANDSABD ------------------------------------------------------------------------------------------------------------------  Chemistries  Recent Labs  Lab 08/16/18 2013  NA 139  K 2.8*  CL 107  CO2 25  GLUCOSE 137*  BUN <5*  CREATININE 0.52  CALCIUM 7.0*   ------------------------------------------------------------------------------------------------------------------ CrCl cannot be calculated (Unknown ideal weight.). ------------------------------------------------------------------------------------------------------------------ No results for input(s): TSH, T4TOTAL, T3FREE, THYROIDAB in the last 72 hours.  Invalid input(s): FREET3  Coagulation profile No results for input(s): INR, PROTIME in the last 168 hours. ------------------------------------------------------------------------------------------------------------------- No results for input(s): DDIMER in the last 72 hours. -------------------------------------------------------------------------------------------------------------------  Cardiac Enzymes No results for input(s): CKMB, TROPONINI, MYOGLOBIN in the last 168 hours.  Invalid input(s):  CK ------------------------------------------------------------------------------------------------------------------ No results found for: BNP   ---------------------------------------------------------------------------------------------------------------  Urinalysis No results found for: COLORURINE, APPEARANCEUR, LABSPEC, [place], GLUCOSEU, HGBUR, BILIRUBINUR, KETONESUR, PROTEINUR, UROBILINOGEN, NITRITE, LEUKOCYTESUR  ----------------------------------------------------------------------------------------------------------------   Imaging Results:    No results found.    Assessment & Plan:    Principal Problem:   Acute respiratory failure with hypoxia (HCC) Active Problems:   HCAP (healthcare-associated pneumonia)   Lung cancer (HCC)   Hypokalemia   Tachycardia   Sepsis ([place])    Acute respiratory failure with hypoxia secondary to Hcap Sepsis (Tachycardia, elevated lactic acid and procalcitonin, Leukocytosis) Blood culture x2 Urine strep antigen Urine legionella antigen Sputum gram stain culture if possible vanco iv pharmacy to dose Cefepime iv pharmacy to dose  Lung cancer (nonsmall cell carcinoma on endobronchial biopsy 07/29/2018), post-obstructive pneumonia Pulmonary consulted requested, spoke with E link  Hypokalemia repleted Check cmp, magnesium in am  Tachycardia Telemetry Check troponin in AM  Asthma Cont Singular 10mg  po qday Cont prednisone 10mg  po qday Cont Brovana Cont pulmicort Cont Albuterol  Gerd Cont PPI Cont h2 blocker    DVT Prophylaxis   Lovenox - SCDs   AM Labs Ordered, also please review Full Orders  Family Communication: Admission, patients condition and plan of care including tests being ordered have been discussed with the patient  who indicate understanding and agree with the plan and Code Status.  Code Status  FULL CODE  Likely DC to  home  Condition GUARDED   Consults called: pulmonary  Admission status:  inpatient.  Pt requires iv abx for complicated pneumonia (hcap), post-obstructive with sepsis (fever, tachycardiac leukocytosis), pt will require more than 2 nites stay for iv abx for acute respiratory failure with hypoxia. As evinced by ABG results  Time spent in minutes : 70   Jani Gravel M.D on 08/16/2018 at 9:35 PM  Between 7am to 7pm - Pager - [phone]   After 7pm go to [email or web address] - password [facility]  [facility] - Office  [phone]

## 2018-08-16 NOTE — Telephone Encounter (Signed)
Of note, case was discussed by EDP with ICU doc at [facility] who recommended hospitalist admission and PCCM c/s prn.

## 2018-08-17 DIAGNOSIS — R0902 Hypoxemia: Secondary | ICD-10-CM

## 2018-08-17 DIAGNOSIS — C3490 Malignant neoplasm of unspecified part of unspecified bronchus or lung: Secondary | ICD-10-CM

## 2018-08-17 DIAGNOSIS — J189 Pneumonia, unspecified organism: Secondary | ICD-10-CM

## 2018-08-17 DIAGNOSIS — J9601 Acute respiratory failure with hypoxia: Secondary | ICD-10-CM

## 2018-08-17 DIAGNOSIS — T17908S Unspecified foreign body in respiratory tract, part unspecified causing other injury, sequela: Secondary | ICD-10-CM

## 2018-08-17 LAB — COMPREHENSIVE METABOLIC PANEL
ALBUMIN: 1.4 g/dL — AB (ref 3.5–5.0)
ALT: 20 U/L (ref 0–44)
ANION GAP: 9 (ref 5–15)
AST: 16 U/L (ref 15–41)
Alkaline Phosphatase: 123 U/L (ref 38–126)
BUN: <5 mg/dL — ABNORMAL LOW (ref 8–23)
CO2: 24 mmol/L (ref 22–32)
Calcium: 7.1 mg/dL — ABNORMAL LOW (ref 8.9–10.3)
Chloride: 106 mmol/L (ref 98–111)
Creatinine, Ser: 0.51 mg/dL (ref 0.44–1.00)
GFR calc non Af Amer: >60 mL/min (ref >60)
GLUCOSE: 117 mg/dL — AB (ref 70–99)
POTASSIUM: 2.5 mmol/L — AB (ref 3.5–5.1)
SODIUM: 139 mmol/L (ref 135–145)
TOTAL PROTEIN: 5.2 g/dL — AB (ref 6.5–8.1)
Total Bilirubin: 0.7 mg/dL (ref 0.3–1.2)

## 2018-08-17 LAB — TROPONIN I: TROPONIN I: 0.03 ng/mL — AB (ref <0.03)

## 2018-08-17 LAB — CBC
HCT: 29.4 % — ABNORMAL LOW (ref 36.0–46.0)
HEMOGLOBIN: 9.5 g/dL — AB (ref 12.0–15.0)
MCH: 28.5 pg (ref 26.0–34.0)
MCHC: 32.3 g/dL (ref 30.0–36.0)
MCV: 88.3 fL (ref 78.0–100.0)
Platelets: 284 10*3/uL (ref 150–400)
RBC: 3.33 MIL/uL — ABNORMAL LOW (ref 3.87–5.11)
RDW: 15.1 % (ref 11.5–15.5)
WBC: 31.1 10*3/uL — ABNORMAL HIGH (ref 4.0–10.5)

## 2018-08-17 LAB — MAGNESIUM: Magnesium: 1.4 mg/dL — ABNORMAL LOW (ref 1.7–2.4)

## 2018-08-17 LAB — HIV ANTIBODY (ROUTINE TESTING W REFLEX): HIV SCREEN 4TH GENERATION: NONREACTIVE

## 2018-08-17 MED ORDER — POTASSIUM CHLORIDE CRYS ER 20 MEQ PO TBCR
40.0000 meq | EXTENDED_RELEASE_TABLET | Freq: Three times a day (TID) | ORAL | Status: AC
  Administered 2018-08-17 (×3): 40 meq via ORAL
  Filled 2018-08-17 (×3): qty 2

## 2018-08-17 MED ORDER — WHITE PETROLATUM EX OINT
TOPICAL_OINTMENT | CUTANEOUS | Status: AC
  Administered 2018-08-17: 14:00:00
  Filled 2018-08-17: qty 28.35

## 2018-08-17 MED ORDER — GUAIFENESIN-DM 100-10 MG/5ML PO SYRP
5.0000 mL | ORAL_SOLUTION | ORAL | Status: DC | PRN
  Administered 2018-08-17 – 2018-08-19 (×5): 5 mL via ORAL
  Filled 2018-08-17: qty 10
  Filled 2018-08-17: qty 5
  Filled 2018-08-17: qty 10
  Filled 2018-08-17: qty 5
  Filled 2018-08-17: qty 10

## 2018-08-17 MED ORDER — METRONIDAZOLE IN NACL 5-0.79 MG/ML-% IV SOLN
500.0000 mg | Freq: Three times a day (TID) | INTRAVENOUS | Status: DC
  Filled 2018-08-17: qty 100

## 2018-08-17 MED ORDER — GUAIFENESIN ER 600 MG PO TB12
600.0000 mg | ORAL_TABLET | Freq: Two times a day (BID) | ORAL | Status: DC
  Administered 2018-08-17 – 2018-08-20 (×7): 600 mg via ORAL
  Filled 2018-08-17 (×7): qty 1

## 2018-08-17 MED ORDER — METRONIDAZOLE IN NACL 5-0.79 MG/ML-% IV SOLN
500.0000 mg | Freq: Three times a day (TID) | INTRAVENOUS | Status: DC
  Administered 2018-08-17 – 2018-08-19 (×6): 500 mg via INTRAVENOUS
  Filled 2018-08-17 (×5): qty 100

## 2018-08-17 NOTE — Progress Notes (Signed)
CRITICAL VALUE ALERT  Critical Value:  Troponin 0.03, Potassium 2.5  Date & Time Notied:  08/17/18 0610 Provider Notified: Alger Memos NP

## 2018-08-17 NOTE — Consult Note (Addendum)
Gail Moore  HYI:[id number] DOB: Jul 29, 1951 DOA: 08/16/2018 PCP: Marco Collie, MD    Reason for Consult / Chief Complaint:  Worsening obstructive pneumonia  Consulting MD:  Ghimir>> [facility]  HPI/Brief Narrative   Patient is a 67 y.o. female former  Smoker ( Quit 2014)  who was recently diagnosed with non small cell Ca of the lung (AdenoCa), PAD-s/p B/L AKA,COPD presented with 4-5 day hx of fever, cough and SOB. Found to have acute hypoxemic resp failure due to post-obstructive PNA, started on empiric Abx and admitted to the hospitalist service. She is on RA with sats of 96%. Pulmonary CCM have been asked to assist with management. Patient is seen by Dr. Melvyn Novas in the office as an outpatient.  Assessment & Plan:  Acute respiratory Failure with hypoxemia 2/2 Worsening Obstructing Pneumonia 2/2  Adenocarcinoma >> Improving now RA Awaiting genetic and molecular testing to determine plan of care per oncology T Max 99.9 WBC 31,000 Chronic prednisone 10 mg daily ( ? Impacting WBC) Spirometry 07/18/2018>> Severe restriction Plan: ABX per primary team Trend CXR ( Last CXR 07/18/2018) Consider PCT monitoring to assist with de-escalation of ABX Aggressive Pulmonary toilet IS Q1 while awake OOB to chair as able Continue prn albuterol Continue Brovana and Pulmicort Continue Robitussin and Mucinex Consider referral to oncology for radiation therapy to treat worsening obstructive pneumonia Aggressive Nutritional support to maintain conditioning in event she is a candidate for immunotherapy to treat her malignancy.  Pulmonary Appreciates the opportunity to participate in [name]'s care. Please see the above recommendations. Pulmonary  will sign off.   Best practice/Goals of care/disposition.   DVT prophylaxis: Lovenox GI prophylaxis: Pepcid Diet: Heart Healthy Mobility:OOB to chair Code Status: Full Family Communication: Updated at bedside   Disposition / Summary of Today's  Plan 08/17/18   Pt with worsening obstructive pneumonia, needs antimicrobial treatment and referral to oncology for  Radiation treatment  to address obstruction while awaiting genetic testing to determine best treatment for adenocarcinoma..    Consultants:  Pulmonary   Procedures: 07/31/2018>> Bronch>> Non-small cell cancer of the lungPatient is a 67 y.o. 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 Found to have acute hypoxemic resp failure due to post-obstructive PNA, started on empiric Abx and admitted to the hospitalist service. Significant Diagnostic Tests: 07/18/2018 CXR right lower lobe collapse and right paratracheal mass suggesting obstructing lung carcinoma with metastatic adenopathy. CT Chest 08/16/2018 Bulky Right paratracheal adenopathy 2.5 cm enlarged subcarinal node, 1.9 cm enlarged Right  Hilar node, Mass effect on the SVC, Large infiltrative central RL lobe mass occluding RLL, RML 8/23>> MRI Brain>> No metastatic disease   Micro Data: Strep Urinary antigen 8/24 Blood Cultures 8/24 Sputum Culture 8/24  Antimicrobials:  Cefepime 8/25>> Vanc 8/24-8/25 Flagyl 8/25>>  Subjective  Comfortable on RA, No distress, Sleepy Family anxious for plan to treat adenocarcinoma  Objective    Examination: General: Supine in bed , on RA with sats of 97% HENT: NCAT, no LAD, No JVD Lungs: Bilateral chest excusion, diminished per R side, Coarse throughout Cardiovascular: S1, S2, RRR, No RMG, ST per tele Abdomen: Soft , NOn-tender, Non-distended, BS +, evidence of prior surgeries and skin grafting. Extremities: Bilateral lower extremity amputations Neuro: Sleepy, MAE x 4, Follows commands , A&O x 2 GU: 700 cc urine output  Blood pressure (!) 105/54, pulse (!) 127, temperature 99.9 F (37.7  C), temperature source Oral, resp. rate (!) 24, weight 55 kg, SpO2 96 %.         Intake/Output Summary (Last 24 hours) at 08/17/2018 1414 Last data filed at 08/17/2018 1000 Gross per 24 hour  Intake 458 ml  Output 1150 ml  Net -692 ml   Filed Weights   08/16/18 1838  Weight: 55 kg     Labs   CBC: Recent Labs  Lab 08/17/18 0336  WBC 31.1*  HGB 9.5*  HCT 29.4*  MCV 88.3  PLT 462   Basic Metabolic Panel: Recent Labs  Lab 08/16/18 2013 08/17/18 0336  NA 139 139  K 2.8* 2.5*  CL 107 106  CO2 25 24  GLUCOSE 137* 117*  BUN <5* <5*  CREATININE 0.52 0.51  CALCIUM 7.0* 7.1*  MG  --  1.4*   GFR: CrCl cannot be calculated (Unknown ideal weight.). Recent Labs  Lab 08/17/18 0336  WBC 31.1*   Liver Function Tests: Recent Labs  Lab 08/17/18 0336  AST 16  ALT 20  ALKPHOS 123  BILITOT 0.7  PROT 5.2*  ALBUMIN 1.4*   No results for input(s): LIPASE, AMYLASE in the last 168 hours. No results for input(s): AMMONIA in the last 168 hours. ABG No results found for: PHART, PCO2ART, PO2ART, HCO3, TCO2, ACIDBASEDEF, O2SAT  Coagulation Profile: No results for input(s): INR, PROTIME in the last 168 hours. Cardiac Enzymes: Recent Labs  Lab 08/17/18 0336  TROPONINI 0.03*   HbA1C: No results found for: HGBA1C CBG: No results for input(s): GLUCAP in the last 168 hours.   Review of Systems:   POSITIVES IN BOLD  Gen: + fever, chills, weight change,+  fatigue, night sweats HEENT: Denies blurred vision, double vision, hearing loss, tinnitus, sinus congestion, rhinorrhea, sore throat, neck stiffness, dysphagia PULM: + improving  shortness of breath,No  cough, + sputum production, hemoptysis, + wheezing CV: Denies chest pain, edema, orthopnea, paroxysmal nocturnal dyspnea, palpitations GI: Denies abdominal pain, nausea, vomiting, diarrhea, hematochezia, melena, constipation, change in bowel habits GU: Denies dysuria, hematuria, polyuria, oliguria, urethral discharge Endocrine: Denies hot or cold intolerance, polyuria, polyphagia or appetite  change Derm: Denies rash, dry skin, scaling or peeling skin change Heme: Denies easy bruising, bleeding, bleeding gums Neuro: Denies headache, numbness, weakness, slurred speech, loss of memory or consciousness  Past medical history   Past Medical History:  Diagnosis Date  . Asthma   . Peripheral vascular disease [facility])    Social History   Social History   Socioeconomic History  . Marital status: Single    Spouse name: Not on file  . Number of children: Not on file  . Years of education: Not on file  . Highest education level: Not on file  Occupational History  . Not on file  Social Needs  . Financial resource strain: Not hard at all  . Food insecurity:    Worry: Never true    Inability: Never true  . Transportation needs:    Medical: No    Non-medical: No  Tobacco Use  . Smoking status: Former Smoker    Packs/day: 0.50    Years: 15.00    Pack years: 7.50    Types: Cigarettes    Last attempt to quit: 12/24/2012    Years since quitting: 5.6  . Smokeless tobacco: Never Used  Substance and Sexual Activity  . Alcohol use: Not Currently    Alcohol/week: 4.0 standard drinks    Types: 4 Cans of beer per week  Comment: per week   . Drug use: Not Currently  . Sexual activity: Not Currently  Lifestyle  . Physical activity:    Days per week: 0 days    Minutes per session: 0 min  . Stress: Not at all  Relationships  . Social connections:    Talks on phone: More than three times a week    Gets together: More than three times a week    Attends religious service: 1 to 4 times per year    Active member of club or organization: No    Attends meetings of clubs or organizations: Never    Relationship status: Never married  . Intimate partner violence:    Fear of current or ex partner: Not on file    Emotionally abused: Not on file    Physically abused: Not on file    Forced sexual activity: Not on file  Other Topics Concern  . Not on file  Social History Narrative  .  Not on file    Family history    Family History  Problem Relation Age of Onset  . Breast cancer Mother   . Breast cancer Father   . Prostate cancer Father     Allergies No Known Allergies  Home meds  Prior to Admission medications   Medication Sig Start Date End Date Taking? Authorizing Provider  albuterol (PROVENTIL) (2.5 MG/3ML) 0.083% nebulizer solution Take 2.5 mg by nebulization every 6 (six) hours as needed for wheezing or shortness of breath.   Yes [provider]  budesonide (PULMICORT) 0.5 MG/2ML nebulizer solution Take 0.5 mg by nebulization 2 (two) times daily.   Yes [provider]  famotidine (PEPCID) 20 MG tablet One at bedtime Patient taking differently: Take 20 mg by mouth at bedtime.  07/18/18  Yes Tanda Rockers, MD  formoterol (PERFOROMIST) 20 MCG/2ML nebulizer solution Take 20 mcg by nebulization 2 (two) times daily.   Yes [provider]  montelukast (SINGULAIR) 10 MG tablet Take 10 mg by mouth at bedtime.   Yes [provider]  pantoprazole (PROTONIX) 40 MG tablet Take 1 tablet (40 mg total) by mouth daily. Take 30-60 min before first meal of the day 07/18/18  Yes Tanda Rockers, MD  predniSONE (DELTASONE) 10 MG tablet 4 tabs for 2 days, then 3 tabs for 2 days, 2 tabs for 2 days, then 1 tab for 2 days, then stop Patient taking differently: Take 10-40 mg by mouth See admin instructions. Take 40 mg by mouth daily for 2 days, then 30 mg by mouth daily for 2 days, then 20 mg by mouth daily for 2 days, then 10 mg by mouth daily for 2 days, then stop 07/29/18  Yes Mack, Vinson Moselle, NP  predniSONE (DELTASONE) 10 MG tablet Take 20mg  daily until better, then 1 daily. 08/06/18  Yes Tanda Rockers, MD     LOS: 1 day   Magdalen Spatz, AGACNP-BC [place] Pager # [phone] 08/17/2018 3:06 PM  Attending Note:  67 year old female with recent history of lung cancer presenting with post-obstructive pneumonia.  On  exam, no BS at the RLL with decreased BS throughout both lungs.  I reviewed chest CT myself, mass noted and obstruction noted.  Discussed with PCCM-NP.   Lung mass:  - Oncology consult  - May consider radiation  HCAP:  - Cefepime  - F/u on cultures  Hypoxemia:  - Titrate O2 for sat of 88-92%  - Will need  an ambulatory desaturation study prior to discharge for home O2.  PCCM will sign off, please call back if needed.  Patient seen and examined, agree with above note.  I dictated the care and orders written for this patient under my direction.  Rush Farmer, [place]

## 2018-08-17 NOTE — Progress Notes (Signed)
PROGRESS NOTE        PATIENT DETAILS Name: Gail Moore Age: 67 y.o. Sex: female Date of Birth: [birth year] Admit Date: 08/16/2018 Admitting Physician A Melven Sartorius., MD BRA:XENMMH, Eustaquio Maize, MD  Brief Narrative: Patient is a 67 y.o. 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 Found to have acute hypoxemic resp failure due to post-obstructive PNA, started on empiric Abx and admitted to the hospitalist service.  Subjective: Feels better-SOB has improved, cough better. No fever since admission.   Assessment/Plan Acute respiratory failure with hypoxia secondary to Post-obstructive WKG:[id number] improved compared to admission-less SOB, cough and is now afebrile.  Cultures never done-although ordered-have spoken with nursing staff to make sure he gets done.  MRSA PCR negative-stop vancomycin-continue cefepime-add Flagyl for anaerobic coverage due to concern for postobstructive pneumonia.  Add Mucinex, incentive spirometry-continue bronchodilators. Titrate off oxygen- follow closely.  Sepsis: Secondary to pneumonia-sepsis pathophysiology has resolved.  Follow closely-await cultures.  Hypokalemia: Replete and recheck.  COPD: Not wheezing-mostly rales heard in the right lung area-continue bronchodilators-she appears to be on chronic prednisone.  Non-small cell cancer of the lung: Recently diagnosed earlier this month-claims that she has been scheduled to follow-up with oncology in the next few weeks.  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  Antimicrobial agents: Anti-infectives (From admission, onward)   Start      Dose/Rate Route Frequency Ordered Stop   08/17/18 0800  vancomycin (VANCOCIN) 500 mg in sodium chloride 0.9 % 100 mL IVPB     500 mg 100 mL/hr over 60 Minutes Intravenous Every 12 hours 08/16/18 2201     08/16/18 2200  ceFEPIme (MAXIPIME) 1 g in sodium chloride 0.9 % 100 mL IVPB     1 g 200 mL/hr over 30 Minutes Intravenous Every 8 hours 08/16/18 1848 08/24/18 2159   08/16/18 2000  vancomycin (VANCOCIN) IVPB 1000 mg/200 mL premix     1,000 mg 200 mL/hr over 60 Minutes Intravenous  Once 08/16/18 1927 08/16/18 2114      Procedures: None  CONSULTS:  None  Time spent: 35 minutes-Greater than 50% of this time was spent in counseling, explanation of diagnosis, planning of further management, and coordination of care.  MEDICATIONS: Scheduled Meds: . arformoterol  15 mcg Nebulization Q12H  . budesonide  0.5 mg Nebulization BID  . enoxaparin (LOVENOX) injection  40 mg Subcutaneous Q24H  . famotidine  20 mg Oral QHS  . guaiFENesin  600 mg Oral BID  . montelukast  10 mg Oral QHS  . pantoprazole  40 mg Oral QAC breakfast  . potassium chloride  40 mEq Oral TID  . predniSONE  10 mg Oral Q breakfast  . white petrolatum       Continuous Infusions: . ceFEPime (MAXIPIME) IV 1 g (08/17/18 0648)  . vancomycin 500 mg (08/17/18 0856)   PRN Meds:.albuterol   PHYSICAL EXAM: Vital signs: Vitals:   08/17/18 0102 08/17/18 0809 08/17/18 0908 08/17/18 0910  BP: 134/65 (!) 105/54    Pulse: (!) 104 (!) 127    Resp: (!) 29 [name])  24    Temp: 98 F (36.7 C) 99.9 F (37.7 C)    TempSrc: Oral Oral    SpO2: 95% 100% 100% 96%  Weight:       Filed Weights   08/16/18 1838  Weight: 55 kg   There is no height or weight on file to calculate BMI.   General appearance :Awake, alert, not in any distress.  Eyes:Pink conjunctiva HEENT: Atraumatic and Normocephalic Neck: supple Resp:Good air entry bilaterally, rales mostly in the right lung area CVS: S1 S2 regular, no murmurs.  GI: Bowel sounds  present, Non tender and not distended with no gaurding, rigidity or rebound.No organomegaly Extremities: B/L AKA Neurology:  speech clear,Non focal, sensation is grossly intact. Psychiatric: Normal judgment and insight. Alert and oriented x 3. Normal mood. Musculoskeletal:No digital cyanosis Skin:No Rash, warm and dry Wounds:N/A  I have personally reviewed following labs and imaging studies  LABORATORY DATA: CBC: Recent Labs  Lab 08/17/18 0336  WBC 31.1*  HGB 9.5*  HCT 29.4*  MCV 88.3  PLT 233    Basic Metabolic Panel: Recent Labs  Lab 08/16/18 2013 08/17/18 0336  NA 139 139  K 2.8* 2.5*  CL 107 106  CO2 25 24  GLUCOSE 137* 117*  BUN <5* <5*  CREATININE 0.52 0.51  CALCIUM 7.0* 7.1*  MG  --  1.4*    GFR: CrCl cannot be calculated (Unknown ideal weight.).  Liver Function Tests: Recent Labs  Lab 08/17/18 0336  AST 16  ALT 20  ALKPHOS 123  BILITOT 0.7  PROT 5.2*  ALBUMIN 1.4*   No results for input(s): LIPASE, AMYLASE in the last 168 hours. No results for input(s): AMMONIA in the last 168 hours.  Coagulation Profile: No results for input(s): INR, PROTIME in the last 168 hours.  Cardiac Enzymes: Recent Labs  Lab 08/17/18 0336  TROPONINI 0.03*    BNP (last 3 results) No results for input(s): PROBNP in the last 8760 hours.  HbA1C: No results for input(s): HGBA1C in the last 72 hours.  CBG: No results for input(s): GLUCAP in the last 168 hours.  Lipid Profile: No results for input(s): CHOL, HDL, LDLCALC, TRIG, CHOLHDL, LDLDIRECT in the last 72 hours.  Thyroid Function Tests: No results for input(s): TSH, T4TOTAL, FREET4, T3FREE, THYROIDAB in the last 72 hours.  Anemia Panel: No results for input(s): VITAMINB12, FOLATE, FERRITIN, TIBC, IRON, RETICCTPCT in the last 72 hours.  Urine analysis: No results found for: COLORURINE, APPEARANCEUR, LABSPEC, PHURINE, GLUCOSEU, HGBUR, BILIRUBINUR, KETONESUR, PROTEINUR, UROBILINOGEN, NITRITE,  LEUKOCYTESUR  Sepsis Labs: Lactic Acid, Venous No results found for: LATICACIDVEN  MICROBIOLOGY: Recent Results (from the past 240 hour(s))  MRSA PCR Screening     Status: None   Collection Time: 08/16/18  9:01 PM  Result Value Ref Range Status   MRSA by PCR NEGATIVE NEGATIVE Final    Comment:        The GeneXpert MRSA Assay (FDA approved for NASAL specimens only), is one component of a comprehensive MRSA colonization surveillance program. It is not intended to diagnose MRSA infection nor to guide or monitor treatment for MRSA infections. Performed at [facility], [place] [address], [place],  [address]     RADIOLOGY STUDIES/RESULTS: Dg Chest 2 View  Result Date: 07/18/2018 CLINICAL DATA:  Cough and dyspnea- time unknown per pt?  Ex smoker. EXAM: CHEST - 2 VIEW COMPARISON:  CT 05/23/2018 FINDINGS: Right paratracheal mass. Right lower lung atelectasis and collapse. Left lung clear. Heart size upper  limits normal. Aortic Atherosclerosis (ICD10-170.0). No effusion. Visualized bones unremarkable. IMPRESSION: 1. Little convincing change in right lower lobe collapse and right paratracheal mass suggesting obstructing lung carcinoma with metastatic adenopathy. Electronically Signed   By: Lucrezia Europe M.D.   On: 07/18/2018 14:50     LOS: 1 day   Oren Binet, MD  [facility]  If 7PM-7AM, please contact night-coverage  Please page via [email or web address]-Password [email or web address]-click on MD name and type text message  08/17/2018, 11:09 AM

## 2018-08-17 NOTE — Progress Notes (Signed)
PT Cancellation Note  Patient Details Name: Gail Moore MRN: [id number] DOB: June 20, 1951   Cancelled Treatment:    Reason Eval/Treat Not Completed: Fatigue/lethargy limiting ability to participate -- son at bedside, pt sleeping soundly and son preferred not to wake her as she has not had adequate sleep since admit.  Will reattempt as able to provide d/c recommendations.   Kearney Hard [facility] 08/17/2018, 1:36 PM

## 2018-08-18 ENCOUNTER — Ambulatory Visit: Payer: Medicare Other | Admitting: Internal Medicine

## 2018-08-18 ENCOUNTER — Ambulatory Visit
Admit: 2018-08-18 | Discharge: 2018-08-18 | Disposition: A | Discharge status: Home / Self Care | Payer: Medicare Other | Source: Ambulatory Visit | Attending: Radiation Oncology | Admitting: Radiation Oncology

## 2018-08-18 DIAGNOSIS — C3431 Malignant neoplasm of lower lobe, right bronchus or lung: Secondary | ICD-10-CM

## 2018-08-18 LAB — CBC
HCT: 26.5 % — ABNORMAL LOW (ref 36.0–46.0)
Hemoglobin: 8.7 g/dL — ABNORMAL LOW (ref 12.0–15.0)
MCH: 28.8 pg (ref 26.0–34.0)
MCHC: 32.8 g/dL (ref 30.0–36.0)
MCV: 87.7 fL (ref 78.0–100.0)
PLATELETS: 252 10*3/uL (ref 150–400)
RBC: 3.02 MIL/uL — AB (ref 3.87–5.11)
RDW: 15.4 % (ref 11.5–15.5)
WBC: 31.7 10*3/uL — ABNORMAL HIGH (ref 4.0–10.5)

## 2018-08-18 LAB — MAGNESIUM: MAGNESIUM: 1.3 mg/dL — AB (ref 1.7–2.4)

## 2018-08-18 LAB — BASIC METABOLIC PANEL
Anion gap: 7 (ref 5–15)
BUN: 5 mg/dL — AB (ref 8–23)
CO2: 22 mmol/L (ref 22–32)
CREATININE: 0.58 mg/dL (ref 0.44–1.00)
Calcium: 7.4 mg/dL — ABNORMAL LOW (ref 8.9–10.3)
Chloride: 108 mmol/L (ref 98–111)
GFR calc Af Amer: >60 mL/min (ref >60)
Glucose, Bld: 164 mg/dL — ABNORMAL HIGH (ref 70–99)
Potassium: 3.5 mmol/L (ref 3.5–5.1)
SODIUM: 137 mmol/L (ref 135–145)

## 2018-08-18 LAB — PROCALCITONIN: Procalcitonin: 1.42 ng/mL

## 2018-08-18 MED ORDER — LIP MEDEX EX OINT
TOPICAL_OINTMENT | CUTANEOUS | Status: DC | PRN
  Filled 2018-08-18: qty 7

## 2018-08-18 MED ORDER — SODIUM CHLORIDE 0.9 % IV SOLN
INTRAVENOUS | Status: DC | PRN
  Administered 2018-08-18: 250 mL via INTRAVENOUS

## 2018-08-18 MED ORDER — MAGNESIUM SULFATE 2 GM/50ML IV SOLN
2.0000 g | Freq: Once | INTRAVENOUS | Status: AC
  Administered 2018-08-18: 2 g via INTRAVENOUS
  Filled 2018-08-18: qty 50

## 2018-08-18 MED ORDER — ENSURE ENLIVE PO LIQD
237.0000 mL | Freq: Two times a day (BID) | ORAL | Status: DC
  Administered 2018-08-18 – 2018-08-22 (×8): 237 mL via ORAL

## 2018-08-18 NOTE — Evaluation (Signed)
Occupational Therapy Evaluation Patient Details Name: Gail Moore MRN: [id number] DOB: 1951-05-01 Today's Date: 08/18/2018    History of Present Illness 67 y.o. 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 Found to have acute hypoxemic resp failure due to post-obstructive PNA, started on empiric Abx and admitted to the hospitalist service. To be transferred to [facility] for radiation on 08/18/18   Clinical Impression   This 67 yo female admitted with above presents to acute OT with generalized weakness thus causing decreased safety and independence with basic ADLs. Pt is normally Mod I for all basic ADLs and IADLs. She will benefit from acute OT with follow up HHOT and 24 hour S/prn A.    Follow Up Recommendations  Home health OT;Supervision/Assistance - 24 hour    Equipment Recommendations  None recommended by OT       Precautions / Restrictions Precautions Precautions: Fall Precaution Comments: Bil AKA      Mobility Bed Mobility Overal bed mobility: Needs Assistance Bed Mobility: Rolling;Supine to Sit;Sit to Supine Rolling: Modified independent (Device/Increase time)   Supine to sit: Min guard(HOB flat, increased time) Sit to supine: Min guard(decreased control of descent)   General bed mobility comments: Pt minguard A to scoot laterally up towards [place] before laying back down     Balance Overall balance assessment: Needs assistance Sitting-balance support: No upper extremity supported Sitting balance-Leahy Scale: Fair                                     ADL either performed or assessed with clinical judgement   ADL Overall ADL's : Needs assistance/impaired Eating/Feeding: Independent;Sitting Eating/Feeding Details (indicate cue type and reason): EOB Grooming: Set up;Sitting Grooming Details (indicate cue type and reason): EOB Upper Body Bathing:  Set up;Sitting Upper Body Bathing Details (indicate cue type and reason): EOB Lower Body Bathing: Set up;Min guard;Sitting/lateral leans Lower Body Bathing Details (indicate cue type and reason): EOB Upper Body Dressing : Set up;Sitting Upper Body Dressing Details (indicate cue type and reason): EOB Lower Body Dressing: Minimal assistance;Sitting/lateral leans Lower Body Dressing Details (indicate cue type and reason): EOB Toilet Transfer: Minimal assistance Toilet Transfer Details (indicate cue type and reason): lateral scoots in bed (normally does anterior/posterior transfers onto BSC)--felt too weak to try today Toileting- Clothing Manipulation and Hygiene: Total assistance;Bed level               Vision Patient Visual Report: No change from baseline              Pertinent Vitals/Pain Pain Assessment: No/denies pain     Hand Dominance Right   Extremity/Trunk Assessment Upper Extremity Assessment Upper Extremity Assessment: Overall WFL for tasks assessed           Communication Communication Communication: No difficulties   Cognition Arousal/Alertness: Awake/alert Behavior During Therapy: WFL for tasks assessed/performed Overall Cognitive Status: Within Functional Limits for tasks assessed                                                Home Living Family/patient expects to be discharged to:: Private residence Living Arrangements: Alone Available Help at Discharge: Family;Available 24 hours/day Type of Home:  Mobile home Home Access: Ramped entrance     Home Layout: One level     Bathroom Shower/Tub: Occupational psychologist: Standard Bathroom Accessibility: Yes   Home Equipment: Wheelchair - manual;Hand held shower head;Tub bench;Other (comment)(transfer board)   Additional Comments: uses sliding board transfers . Family does A her in and of tub       Prior Functioning/Environment Level of Independence: Independent         Comments: independent with mobility, and iADLs        OT Problem List: Impaired balance (sitting and/or standing)      OT Treatment/Interventions: Self-care/ADL training;Balance training;Therapeutic activities;DME and/or AE instruction;Patient/family education    OT Goals(Current goals can be found in the care plan section) Acute Rehab OT Goals Patient Stated Goal: get to feeling better and go home OT Goal Formulation: With patient Time For Goal Achievement: 09/01/18 Potential to Achieve Goals: Good  OT Frequency: Min 2X/week              AM-PAC PT "6 Clicks" Daily Activity     Outcome Measure Help from another person eating meals?: None Help from another person taking care of personal grooming?: A Little Help from another person toileting, which includes using toliet, bedpan, or urinal?: A Lot Help from another person bathing (including washing, rinsing, drying)?: A Little Help from another person to put on and taking off regular upper body clothing?: A Little Help from another person to put on and taking off regular lower body clothing?: A Little 6 Click Score: 18   End of Session Nurse Communication: (pt had watery bowel movement)  Activity Tolerance: Patient tolerated treatment well Patient left: in bed;with call bell/phone within reach;with bed alarm set;with nursing/sitter in room  OT Visit Diagnosis: Other abnormalities of gait and mobility (R26.89);Muscle weakness (generalized) (M62.81)                Time: 8937-3428 OT Time Calculation (min): 30 min Charges:  OT General Charges $OT Visit: 1 Visit OT Evaluation $OT Eval Moderate Complexity: 1 Mod OT Treatments $Self Care/Home Management : 8-22 mins  [place]en Circle, OTR/L [phone] 08/18/2018

## 2018-08-18 NOTE — Progress Notes (Signed)
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 female who is presenting to the hospital with newly diagnosed non-small cell lung cancer. She is currently admitted to the hospital for acute respiratory failure with hypoxia.   She has underwent CTA Chest on 08/16/2018 with results showing: No pulmonary embolism. Large poorly marginated central right lower lung neoplasm, occluding the right middle lobe and right lower lobe bronchi with complete postobstructive rightmiddle and right lower lobe pneumonia, compatible with reported history of primary bronchogenic malignancy. Bulky right hilar, subcarinal and right paratracheal nodal metastases. Mild asymmetric interlobular septal thickening throughout the right lung could represent asymmetric pulmonary edema or lymphangitic tumor spread. Three-vessel coronary atherosclerosis.   PET Scan on 08/13/2018 showed: Markedly hypermetabolic central right lower lung lesion with occlusion of the  bronchus intermedius. Diffuse hypermetabolism identified in the collapsed right lower lobe. Bulky mediastinal lymphadenopathy is hypermetabolic with a small contralateral hypermetabolic mediastinal nodes. 19 mm left adrenal nodule with CT appearance most suggestive of adenoma. Low level FDG uptake in this nodule is evident, but adrenal adenoma can be FDG avid.    She also had a endobronchial biopsy completed on 07/31/2018 that showed: Endobronchial biopsy, RLL orifice with poorly differentiated non-small cell carcinoma. Endobronchial biopsy, RLL, part of mass broken off vs mucus plug with poorly differentiated non-small cell carcinoma. The tumor is poorly  differentiated and high grade. Immunohistochemistry shows the tumor is positive with thyroid transcription factor-1, Napsin-A, cytokeratin AE1/AE3 and cytokeratin 903.  On 07/31/2018, the patient also had a bronchial lavage, RLL (specimen 1 of 1 collected 07/31/2018) with no malignant cells identified.    PREVIOUS RADIATION THERAPY: No  PAST MEDICAL HISTORY:  has a past medical history of Asthma and Peripheral vascular disease ([place]).    PAST SURGICAL HISTORY: Past Surgical History:  Procedure Laterality Date  . ABOVE KNEE LEG AMPUTATION Bilateral   . APPENDECTOMY    . COLON SURGERY    . VIDEO BRONCHOSCOPY Bilateral 07/31/2018   Procedure: VIDEO BRONCHOSCOPY WITHOUT FLUORO;  Surgeon: Tanda Rockers, MD;  Location: [facility];  Service: Cardiopulmonary;  Laterality: Bilateral;    FAMILY HISTORY: family history includes Breast cancer in her father and mother; Prostate cancer in her father.  SOCIAL HISTORY:  reports that she quit smoking about 5 years ago. Her smoking use included cigarettes. She has a 7.50 pack-year smoking history. She has never used smokeless tobacco. She reports that she drank about 4.0 standard drinks of alcohol per week. She reports that she has current or past drug history.  ALLERGIES: Patient has no known allergies.  MEDICATIONS:  No current facility-administered medications for this encounter.    No current outpatient medications on file.   Facility-Administered Medications Ordered in Other Encounters  Medication Dose Route Frequency Provider Last Rate Last Dose  . albuterol (PROVENTIL) (2.5 MG/3ML) 0.083% nebulizer solution 2.5 mg  2.5 mg Nebulization Q6H PRN Jani Gravel, MD      . arformoterol [facility]) nebulizer solution 15 mcg  15 mcg Nebulization Q12H Jani Gravel, MD   15 mcg at 08/18/18 [phone]  . budesonide (PULMICORT) nebulizer solution 0.5 mg  0.5 mg Nebulization BID Jani Gravel, MD   0.5 mg at 08/18/18  7824  . ceFEPIme (MAXIPIME) 1 g in sodium chloride 0.9 %  100 mL IVPB  1 g Intravenous Lysle Dingwall, MD 200 mL/hr at 08/18/18 1437 1 g at 08/18/18 1437  . enoxaparin (LOVENOX) injection 40 mg  40 mg Subcutaneous Q24H Jani Gravel, MD   40 mg at 08/17/18 2140  . famotidine (PEPCID) tablet 20 mg  20 mg Oral Loma Sousa, MD   20 mg at 08/17/18 2140  . feeding supplement (ENSURE ENLIVE) (ENSURE ENLIVE) liquid 237 mL  237 mL Oral BID BM Jonetta Osgood, MD   237 mL at 08/18/18 1444  . guaiFENesin (MUCINEX) 12 hr tablet 600 mg  600 mg Oral BID Jonetta Osgood, MD   600 mg at 08/18/18 0848  . guaiFENesin-dextromethorphan (ROBITUSSIN DM) 100-10 MG/5ML syrup 5 mL  5 mL Oral Q4H PRN Jonetta Osgood, MD   5 mL at 08/18/18 0141  . metroNIDAZOLE (FLAGYL) IVPB 500 mg  500 mg Intravenous Q8H Jonetta Osgood, MD 100 mL/hr at 08/18/18 0844 500 mg at 08/18/18 0844  . montelukast (SINGULAIR) tablet 10 mg  10 mg Oral QHS Jani Gravel, MD   10 mg at 08/17/18 2140  . pantoprazole (PROTONIX) EC tablet 40 mg  40 mg Oral QAC breakfast Jani Gravel, MD   40 mg at 08/18/18 0631  . predniSONE (DELTASONE) tablet 10 mg  10 mg Oral Q breakfast Jani Gravel, MD   10 mg at 08/18/18 0631    REVIEW OF SYSTEMS:  REVIEW OF SYSTEMS: A 10+ POINT REVIEW OF SYSTEMS WAS OBTAINED including neurology, dermatology, psychiatry, cardiac, respiratory, lymph, extremities, GI, GU, musculoskeletal, constitutional, reproductive, HEENT. All pertinent positives are noted in the HPI. All others are negative. She denies any pain in the chest area significant cough or hemoptysis. Her breathing has stabilized while in the hospital. She denies any new bony pain.   PHYSICAL EXAM: this is a very pleasant 67 year old female in no acute distress. She is lying in her hospital bed and accompanied this evening by her attentive caring son. The patient responds appropriately to questions. Examination of the neck and supraclavicular region reveals no evidence of adenopathy. Axillary areas are free of adenopathy.  Examination of the lungs reveals markedly decreased breath sounds in the right lower lung field.  the heart has a regular rhythm and rate. The abdomen is soft and nontender with normal bowel sounds. The skin graft noted in the central abdominal region. Patient has bilateral above-the-knee amputations.    ECOG = 4  0 - Asymptomatic (Fully active, able to carry on all predisease activities without restriction)  1 - Symptomatic but completely ambulatory (Restricted in physically strenuous activity but ambulatory and able to carry out work of a light or sedentary nature. For example, light housework, office work)  2 - Symptomatic, <50% in bed during the day (Ambulatory and capable of all self care but unable to carry out any work activities. Up and about more than 50% of waking hours)  3 - Symptomatic, >50% in bed, but not bedbound (Capable of only limited self-care, confined to bed or chair 50% or more of waking hours)  4 - Bedbound (Completely disabled. Cannot carry on any self-care. Totally confined to bed or chair)  5 - Death   Eustace Pen MM, Creech RH, Tormey DC, et al. [phone]). "Toxicity and response criteria of the [facility]". [place]. 5 (6): 649-55  LABORATORY DATA:  Lab Results  Component Value Date  WBC 31.7 (H) 08/18/2018   HGB 8.7 (L) 08/18/2018   HCT 26.5 (L) 08/18/2018   MCV 87.7 08/18/2018   PLT 252 08/18/2018   Lab Results  Component Value Date   NA 137 08/18/2018   K 3.5 08/18/2018   CL 108 08/18/2018   CO2 22 08/18/2018   GLUCOSE 164 (H) 08/18/2018   CREATININE 0.58 08/18/2018   CALCIUM 7.4 (L) 08/18/2018      RADIOGRAPHY: No results found.    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 Patient was recently admitted to the hospital with respiratory distress. This is related to her lung cancer which is resulted in collapse of her right lower lobe and resulting postobstructive pneumonia. Patient is not a candidate for  chemotherapy at this time given her overall situation. Final details concerning her pathology pending at this time to see if she may repeat a potential candidate for targeted therapy. The patient will be a good candidate for initiation of her radiation while an inpatient. Her breathing status likely will not improve until she has had tumor shrinkage and hopefully the expansion of her right lower lobe. This evening I discussed radiation therapy with the patient and her son. We discussed the general course of treatment side effects and potential toxicities of radiation therapy in this situation. Patient appears to understand as well as her son wishes that the patient initiate radiation therapy while an inpatient.  PLAN:the patient will be brought down to the radiation oncology Departmentat 8 AM tomorrow morning for simulation and will begin treatments tomorrow. She will initially received 14 treatments to the mediastinum and right lung mass. She will then proceed with additional evaluation and consideration for additional treatment. On recent PET scan and brain scanning the patient does not have any distant metastatic disease so aggressive treatment is indicated. Prior to admission the patient has been very functional despite her bilateral above-the-knee amputations. She admits to doing her own laundry and housework as well as cooking.     ------------------------------------------------  Blair Promise, PhD, MD   This document serves as a record of services personally performed by Gery Pray, MD. It was created on his behalf by Steva Colder, a trained medical scribe. The creation of this record is based on the scribe's personal observations and the provider's statements to them. This document has been checked and approved by the attending provider.

## 2018-08-18 NOTE — Progress Notes (Signed)
Initial Nutrition Assessment  DOCUMENTATION CODES:   Severe malnutrition in context of chronic illness  INTERVENTION:   - Ensure Enlive po BID, each supplement provides 350 kcal and 20 grams of protein  NUTRITION DIAGNOSIS:   Moderate Malnutrition related to chronic illness, cancer and cancer related treatments (COPD, non-small cell lung cancer) as evidenced by mild fat depletion, moderate fat depletion, mild muscle depletion, moderate muscle depletion.  GOAL:   Patient will meet greater than or equal to 90% of their needs  MONITOR:   PO intake, Supplement acceptance, I & O's, Weight trends, Labs  REASON FOR ASSESSMENT:   Malnutrition Screening Tool    ASSESSMENT:   67 year old female with PMH significant for non-small cell lung cancer, COPD, and PVD s/p bilateral AKA. Pt admitted for acute hypoxic respiratory failure secondary to post-obstructive pneumonia, HCAP, and severe hypokalemia.  Spoke with pt at bedside who reports her main complaint is diarrhea this morning.  Pt states that she typically has a good appetite and eats "all day." Pt reports she eats Cheez-its, yogurt, ice cream, and 2-3 oral nutrition supplements daily. Pt is agreeable to receiving Ensure Enlive during current admission.  Pt reports her UBW after her bilateral AKA was between 125-130 lbs. Pt is unsure when she last weighed this but states that "it was a while ago."  Pt states that she occasionally has difficulty swallowing "due to the cancer." Pt denies N/V and difficulties chewing.  Meal Completion: 90%  Medications reviewed and include: 20 mg Pepcid daily, 40 mg Protonix daily, IV antibiotics  Labs reviewed: magnesium 1.3 (L), hemoglobin 8.7 (L), HCT 26.5 (L)  NUTRITION - FOCUSED PHYSICAL EXAM:    Most Recent Value  Orbital Region  Mild depletion  Upper Arm Region  Moderate depletion  Thoracic and Lumbar Region  Mild depletion  Buccal Region  No depletion  Temple Region  Moderate  depletion  Clavicle Bone Region  Mild depletion  Clavicle and Acromion Bone Region  Mild depletion  Scapular Bone Region  Unable to assess  Dorsal Hand  Moderate depletion  Patellar Region  Unable to assess [bilateral AKA]  Anterior Thigh Region  Mild depletion  Posterior Calf Region  Unable to assess [bilateral AKA]  Edema (RD Assessment)  None  Hair  Reviewed  Eyes  Reviewed  Mouth  Reviewed  Skin  Reviewed  Nails  Reviewed       Diet Order:   Diet Order            Diet Heart Room service appropriate? Yes; Fluid consistency: Thin  Diet effective now              EDUCATION NEEDS:   No education needs have been identified at this time  Skin:  Skin Assessment: Reviewed RN Assessment  Last BM:  08/17/18 - small type 4  Height:   Ht Readings from Last 1 Encounters:  No data found for Ht    Weight:   Wt Readings from Last 1 Encounters:  08/16/18 55 kg    Ideal Body Weight:  No pre-AKA height recorded in chart.  BMI:  No pre-AKA height recorded in chart.  Estimated Nutritional Needs:   Kcal:  1500-1700  Protein:  70-85 grams  Fluid:  >/= 1.5 L    Gaynell Face, MS, RD, LDN Pager: [phone] Weekend/After Hours: [phone]

## 2018-08-18 NOTE — Progress Notes (Addendum)
PROGRESS NOTE        PATIENT DETAILS Name: Gail Moore Age: 67 y.o. Sex: female Date of Birth: 12-10-1951 Admit Date: 08/16/2018 Admitting Physician A Melven Sartorius., MD [id number], Eustaquio Maize, MD  Brief Narrative: Patient is a 67 y.o. 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 Found to have acute hypoxemic resp failure due to post-obstructive PNA, started on empiric Abx and admitted to the hospitalist service.  Subjective: She just does not feel good today but her breathing has improved.  Her cough has improved as well.  Afebrile.  Assessment/Plan Acute respiratory failure with hypoxia secondary to Post-obstructive PNA: Somewhat improved-her shortness of breath is better than yesterday, she is afebrile but she continues to have significant leukocytosis.  Continue with cefepime and Flagyl, blood cultures negative so far.  Continue supportive care with bronchodilators, Mucinex, incentive spirometry etc.  Titrate off oxygen as tolerated.   Sepsis: Secondary to above, sepsis pathophysiology has resolved.  Blood cultures negative so far.  Continue empiric antimicrobial therapy  Hypokalemia: Repleted, recheck periodically.  COPD: Continue bronchodilators and chronic prednisone.  No evidence of wheezing at this time.  Non-small cell cancer of the lung: Diagnosed a few weeks back-has had a initial visit with Dr. Hinton Rao (oncology at [facility], apparently was set up to see radiation oncology as well.  Spoke with Dr. Earlie Server over the phone-he suggested we get in touch with radiation oncology-spoke with Dr Clent Jacks will review chart and get in touch with me.  Addendum 1 pm: Spoke with Dr. Geraldo Docker oncology-recommends transfer to [facility] to begin radiation.  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  Disposition Plan: Remain inpatient-needs a few more days of hospitalization-probably home with home health services or SNF on discharge.  Antimicrobial agents: Anti-infectives (From admission, onward)   Start     Dose/Rate Route Frequency Ordered Stop   08/17/18 1600  metroNIDAZOLE (FLAGYL) IVPB 500 mg     500 mg 100 mL/hr over 60 Minutes Intravenous Every 8 hours 08/17/18 1544     08/17/18 1230  metroNIDAZOLE (FLAGYL) IVPB 500 mg  Status:  Discontinued     500 mg 100 mL/hr over 60 Minutes Intravenous Every 8 hours 08/17/18 1157 08/17/18 1544   08/17/18 0800  vancomycin (VANCOCIN) 500 mg in sodium chloride 0.9 % 100 mL IVPB  Status:  Discontinued     500 mg 100 mL/hr over 60 Minutes Intravenous Every 12 hours 08/16/18 2201 08/17/18 1157   08/16/18 2200  ceFEPIme (MAXIPIME) 1 g in sodium chloride 0.9 % 100 mL IVPB     1 g 200 mL/hr over 30 Minutes Intravenous Every 8 hours 08/16/18 1848 08/24/18 2159   08/16/18 2000  vancomycin (VANCOCIN) IVPB 1000 mg/200 mL premix     1,000 mg 200 mL/hr over 60 Minutes Intravenous  Once 08/16/18 1927 08/16/18 2114      Procedures: None  CONSULTS:  None  Time spent: 25 minutes-Greater than 50% of this time was spent in counseling, explanation of  diagnosis, planning of further management, and coordination of care.  MEDICATIONS: Scheduled Meds: . arformoterol  15 mcg Nebulization Q12H  . budesonide  0.5 mg Nebulization BID  . enoxaparin (LOVENOX) injection  40 mg Subcutaneous Q24H  . famotidine  20 mg Oral QHS  . guaiFENesin  600 mg Oral BID  . montelukast  10 mg Oral QHS  . pantoprazole  40 mg Oral QAC breakfast  . predniSONE  10 mg Oral Q breakfast   Continuous Infusions: .  ceFEPime (MAXIPIME) IV 1 g (08/18/18 0634)  . metronidazole 500 mg (08/18/18 0844)   PRN Meds:.albuterol, guaiFENesin-dextromethorphan   PHYSICAL EXAM: Vital signs: Vitals:   08/17/18 1944 08/17/18 2222 08/18/18 0746 08/18/18 0835  BP:  (!) 105/51 (!) 102/58   Pulse:  (!) 113 (!) 107   Resp:  (!) 22 (!) 34   Temp:  98.7 F (37.1 C) 98.9 F (37.2 C)   TempSrc:  Oral Oral   SpO2: 95% 94% 100% 98%  Weight:       Filed Weights   08/16/18 1838  Weight: 55 kg   There is no height or weight on file to calculate BMI.   General appearance:Awake, alert, not in any distress.  Eyes:no scleral icterus. HEENT: Atraumatic and Normocephalic Neck: supple, no JVD. Resp: Moving air well-few rales on the right lung area-some scattered rhonchi  CVS: S1 S2 regular, no murmurs.  GI: Bowel sounds present, Non tender and not distended with no gaurding, rigidity or rebound. Extremities: B/L AKA Neurology:  Non focal Musculoskeletal:No digital cyanosis Skin:No Rash, warm and dry Wounds:N/A  I have personally reviewed following labs and imaging studies  LABORATORY DATA: CBC: Recent Labs  Lab 08/17/18 0336 08/18/18 0329  WBC 31.1* 31.7*  HGB 9.5* 8.7*  HCT 29.4* 26.5*  MCV 88.3 87.7  PLT 284 785    Basic Metabolic Panel: Recent Labs  Lab 08/16/18 2013 08/17/18 0336 08/18/18 0329  NA 139 139 137  K 2.8* 2.5* 3.5  CL 107 106 108  CO2 25 24 22   GLUCOSE 137* 117* 164*  BUN <5* <5* 5*  CREATININE 0.52 0.51 0.58  CALCIUM 7.0* 7.1* 7.4*  MG  --  1.4* 1.3*    GFR: CrCl cannot be calculated (Unknown ideal weight.).  Liver Function Tests: Recent Labs  Lab 08/17/18 0336  AST 16  ALT 20  ALKPHOS 123  BILITOT 0.7  PROT 5.2*  ALBUMIN 1.4*   No results for input(s): LIPASE, AMYLASE in the last 168 hours. No results for input(s): AMMONIA in the last 168 hours.  Coagulation Profile: No results for input(s): INR, PROTIME in the last 168 hours.  Cardiac Enzymes: Recent  Labs  Lab 08/17/18 0336  TROPONINI 0.03*    BNP (last 3 results) No results for input(s): PROBNP in the last 8760 hours.  HbA1C: No results for input(s): HGBA1C in the last 72 hours.  CBG: No results for input(s): GLUCAP in the last 168 hours.  Lipid Profile: No results for input(s): CHOL, HDL, LDLCALC, TRIG, CHOLHDL, LDLDIRECT in the last 72 hours.  Thyroid Function Tests: No results for input(s): TSH, T4TOTAL, FREET4, T3FREE, THYROIDAB in the last 72 hours.  Anemia Panel: No results for input(s): VITAMINB12, FOLATE, FERRITIN, TIBC, IRON, RETICCTPCT in the last 72 hours.  Urine analysis: No results found for: COLORURINE, APPEARANCEUR, LABSPEC, PHURINE, GLUCOSEU, HGBUR, BILIRUBINUR, KETONESUR, PROTEINUR, UROBILINOGEN, NITRITE, LEUKOCYTESUR  Sepsis Labs: Lactic Acid, Venous No results found for: LATICACIDVEN  MICROBIOLOGY: Recent Results (from the past 240  hour(s))  MRSA PCR Screening     Status: None   Collection Time: 08/16/18  9:01 PM  Result Value Ref Range Status   MRSA by PCR NEGATIVE NEGATIVE Final    Comment:        The GeneXpert MRSA Assay (FDA approved for NASAL specimens only), is one component of a comprehensive MRSA colonization surveillance program. It is not intended to diagnose MRSA infection nor to guide or monitor treatment for MRSA infections. Performed at [facility], [place] [address], [place], [place] [address]   Culture, blood (Routine X 2) w Reflex to ID Panel     Status: None (Preliminary result)   Collection Time: 08/17/18 11:48 AM  Result Value Ref Range Status   Specimen Description BLOOD RIGHT ARM  Final   Special Requests   Final    BOTTLES DRAWN AEROBIC ONLY Blood Culture results may not be optimal due to an inadequate volume of blood received in culture bottles   Culture   Final    NO GROWTH < 24 HOURS Performed at [facility], [place] [address], [place], [place] [address]    Report Status PENDING  Incomplete  Culture,  blood (Routine X 2) w Reflex to ID Panel     Status: None (Preliminary result)   Collection Time: 08/17/18 11:56 AM  Result Value Ref Range Status   Specimen Description BLOOD RIGHT HAND  Final   Special Requests   Final    BOTTLES DRAWN AEROBIC ONLY Blood Culture results may not be optimal due to an inadequate volume of blood received in culture bottles   Culture   Final    NO GROWTH < 24 HOURS Performed at [facility], [place] [address], [place], [place] [address]    Report Status PENDING  Incomplete    RADIOLOGY STUDIES/RESULTS: No results found.   LOS: 2 days   Oren Binet, MD  [facility]  If 7PM-7AM, please contact night-coverage  Please page via [email or web address]-Password [email or web address]-click on MD name and type text message  08/18/2018, 11:17 AM

## 2018-08-18 NOTE — Progress Notes (Signed)
Chaplain presented to the patient to provide spiritual care support for the patient, and also to inquire whether the patient has completed an Advance Directive of would be interested in completed it at this time. The patient reports that she "already has all that  in place."  She was appreciative of the visit, Chaplain also offered a prayer of continued healing and wholeness for the patient. Chaplain Yaakov Guthrie [phone]

## 2018-08-18 NOTE — Evaluation (Signed)
Physical Therapy Evaluation Patient Details Name: Gail Moore MRN: [id number] DOB: [birth year] Today's Date: 08/18/2018   History of Present Illness  67 y.o. 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 Found to have acute hypoxemic resp failure due to post-obstructive PNA, started on empiric Abx and admitted to the hospitalist service.  Clinical Impression  PTA pt independent in all mobility, utilizing sliding board for transfers to w/c, toilet and shower bench. Pt independent in iADLs, cooking and cleaning for herself. Pt son lives at end of road and can be available 24 hr/day initially at d/c. Pt currently requires min A with HoB elevated to come into longsitting from supine, min guard for pivoting in bed and for scooting to the HoB, mod A and HoB elevated to transfer back into supine. Pt stomach is not feeling well and she did not want to transfer to recliner today. PT recommending OT consult and currently recommend [facility] rehab at d/c. PT will continue to follow acutely.     Follow Up Recommendations Home health PT;Supervision/Assistance - 24 hour    Equipment Recommendations  None recommended by PT    Recommendations for Other Services OT consult     Precautions / Restrictions Precautions Precautions: Fall Restrictions Weight Bearing Restrictions: No      Mobility  Bed Mobility Overal bed mobility: Needs Assistance Bed Mobility: Supine to Sit;Sit to Supine     Supine to sit: Min assist;HOB elevated Sit to supine: HOB elevated;Mod assist   General bed mobility comments: minA for supine to longsitting, pt min guard for pivoting to EoB and for scooting laterally towards HoB . modA to bring HoB up towards pt in longsitting to recline to supine  Transfers                 General transfer comment: not attempted secondary to pt not feeling well           Balance Overall balance assessment: Mild deficits observed, not formally tested                                           Pertinent Vitals/Pain Pain Assessment: No/denies pain(however feels sick on stomach )    Home Living Family/patient expects to be discharged to:: Private residence Living Arrangements: Alone Available Help at Discharge: Family;Available 24 hours/day Type of Home: Mobile home Home Access: Ramped entrance     Home Layout: One level Home Equipment: Wheelchair - manual;Shower seat - built in;Hand held Hydrologist) Additional Comments: uses sliding board transfers     Prior Function Level of Independence: Independent         Comments: independent with mobility, and iADLs        Extremity/Trunk Assessment   Upper Extremity Assessment Upper Extremity Assessment: Generalized weakness    Lower Extremity Assessment Lower Extremity Assessment: RLE deficits/detail;LLE deficits/detail RLE Deficits / Details: R AKA, LLE Deficits / Details: L AKA       Communication   Communication: No difficulties  Cognition Arousal/Alertness: Awake/alert Behavior During Therapy: WFL for tasks assessed/performed;Flat affect Overall Cognitive Status: Within Functional Limits for tasks assessed  General Comments General comments (skin integrity, edema, etc.): VSS        Assessment/Plan    PT Assessment Patient needs continued PT services  PT Problem List Decreased strength;Decreased activity tolerance;Decreased mobility       PT Treatment Interventions DME instruction;Functional mobility training;Therapeutic activities;Therapeutic exercise;Balance training;Cognitive remediation;Wheelchair mobility training;Patient/family education    PT Goals (Current goals can be found in the Care Plan section)  Acute Rehab PT Goals Patient Stated Goal: feel better PT Goal Formulation:  With patient Time For Goal Achievement: 09/01/18 Potential to Achieve Goals: Fair    Frequency Min 3X/week    AM-PAC PT "6 Clicks" Daily Activity  Outcome Measure Difficulty turning over in bed (including adjusting bedclothes, sheets and blankets)?: A Little Difficulty moving from lying on back to sitting on the side of the bed? : Unable Difficulty sitting down on and standing up from a chair with arms (e.g., wheelchair, bedside commode, etc,.)?: Unable Help needed moving to and from a bed to chair (including a wheelchair)?: Total Help needed walking in hospital room?: Total Help needed climbing 3-5 steps with a railing? : Total 6 Click Score: 8    End of Session   Activity Tolerance: Other (comment)(pt limited by feeling sick to stomach) Patient left: in bed;with call bell/phone within reach Nurse Communication: Mobility status PT Visit Diagnosis: Other abnormalities of gait and mobility (R26.89);Muscle weakness (generalized) (M62.81);Difficulty in walking, not elsewhere classified (R26.2)    Time: 1015-1030 PT Time Calculation (min) (ACUTE ONLY): 15 min   Charges:   PT Evaluation $PT Eval Moderate Complexity: 1 Mod          Corinthian Mizrahi B. Migdalia Moore PT, DPT Acute Rehabilitation  [phone] Pager [phone]    [place] 08/18/2018, 10:43 AM

## 2018-08-19 ENCOUNTER — Ambulatory Visit
Admit: 2018-08-19 | Discharge: 2018-08-19 | Disposition: A | Discharge status: Home / Self Care | Payer: Medicare Other | Attending: Radiation Oncology | Admitting: Radiation Oncology

## 2018-08-19 ENCOUNTER — Encounter (HOSPITAL_COMMUNITY): Payer: Self-pay | Admitting: Internal Medicine

## 2018-08-19 DIAGNOSIS — E43 Unspecified severe protein-calorie malnutrition: Secondary | ICD-10-CM

## 2018-08-19 DIAGNOSIS — C3431 Malignant neoplasm of lower lobe, right bronchus or lung: Secondary | ICD-10-CM

## 2018-08-19 LAB — CBC WITH DIFFERENTIAL/PLATELET
Basophils Absolute: 0 10*3/uL (ref 0.0–0.1)
Basophils Relative: 0 %
Eosinophils Absolute: 0.2 10*3/uL (ref 0.0–0.7)
Eosinophils Relative: 1 %
HCT: 26 % — ABNORMAL LOW (ref 36.0–46.0)
HEMOGLOBIN: 8.5 g/dL — AB (ref 12.0–15.0)
LYMPHS ABS: 1.3 10*3/uL (ref 0.7–4.0)
Lymphocytes Relative: 6 %
MCH: 28.6 pg (ref 26.0–34.0)
MCHC: 32.7 g/dL (ref 30.0–36.0)
MCV: 87.5 fL (ref 78.0–100.0)
Monocytes Absolute: 1 10*3/uL (ref 0.1–1.0)
Monocytes Relative: 5 %
Neutro Abs: 19.4 10*3/uL — ABNORMAL HIGH (ref 1.7–7.7)
Neutrophils Relative %: 88 %
Platelets: 291 10*3/uL (ref 150–400)
RBC: 2.97 MIL/uL — AB (ref 3.87–5.11)
RDW: 15.5 % (ref 11.5–15.5)
WBC: 21.9 10*3/uL — AB (ref 4.0–10.5)

## 2018-08-19 LAB — BASIC METABOLIC PANEL
Anion gap: 11 (ref 5–15)
BUN: 7 mg/dL — AB (ref 8–23)
CHLORIDE: 104 mmol/L (ref 98–111)
CO2: 21 mmol/L — AB (ref 22–32)
Calcium: 7.5 mg/dL — ABNORMAL LOW (ref 8.9–10.3)
Creatinine, Ser: 0.57 mg/dL (ref 0.44–1.00)
GFR calc Af Amer: >60 mL/min (ref >60)
GFR calc non Af Amer: >60 mL/min (ref >60)
Glucose, Bld: 165 mg/dL — ABNORMAL HIGH (ref 70–99)
POTASSIUM: 2.7 mmol/L — AB (ref 3.5–5.1)
Sodium: 136 mmol/L (ref 135–145)

## 2018-08-19 LAB — PROCALCITONIN: Procalcitonin: 1.08 ng/mL

## 2018-08-19 LAB — MAGNESIUM: Magnesium: 1.7 mg/dL (ref 1.7–2.4)

## 2018-08-19 MED ORDER — POTASSIUM CHLORIDE 10 MEQ/100ML IV SOLN
10.0000 meq | INTRAVENOUS | Status: AC
  Administered 2018-08-19 (×2): 10 meq via INTRAVENOUS

## 2018-08-19 MED ORDER — POTASSIUM CHLORIDE 10 MEQ/100ML IV SOLN
10.0000 meq | INTRAVENOUS | Status: AC
  Administered 2018-08-19 (×2): 10 meq via INTRAVENOUS
  Filled 2018-08-19 (×2): qty 100

## 2018-08-19 MED ORDER — METRONIDAZOLE 500 MG PO TABS
500.0000 mg | ORAL_TABLET | Freq: Three times a day (TID) | ORAL | Status: DC
  Administered 2018-08-19 – 2018-08-23 (×13): 500 mg via ORAL
  Filled 2018-08-19 (×13): qty 1

## 2018-08-19 NOTE — Progress Notes (Signed)
PT Cancellation Note  Patient Details Name: Gail Moore MRN: [id number] DOB: 1951/05/18   Cancelled Treatment:     Pt unable to tolerate plus K+ 2.7   Pt has been evaluated with rec to return home with [facility] PT.  Has all equipment.  Prior level was Indep transfers via sliding board/scooting to and from wheelchair.  Will continue to monitor for activty.    Rica Koyanagi  PTA [facility]  Acute  Rehab Pager      [phone]

## 2018-08-19 NOTE — Progress Notes (Signed)
[facility] Therapy Treatment Record Phone [phone]   Radiation Therapy was administered to Gail Moore on: 08/19/2018  3:14 PM and was treatment # 1 out of a planned course of 14 treatments.  Radiation Treatment  1). Beam photons with 6-10 energy  2). Brachytherapy None  3). Stereotactic Radiosurgery None  4). Other Radiation None     Gail Moore F Gail Moore, RT (T)

## 2018-08-19 NOTE — Progress Notes (Signed)
  Radiation Oncology         [phone] ________________________________  Name: Gail Moore MRN: [id number]  Date: 08/19/2018  DOB: [birth year]  Simulation Verification Note    ICD-10-CM   1. Malignant neoplasm of lower lobe of right lung [facility]) C34.31     Status: Inpatient  NARRATIVE: The patient was brought to the treatment unit and placed in the planned treatment position. The clinical setup was verified. Then port films were obtained and uploaded to the radiation oncology medical record software.  The treatment beams were carefully compared against the planned radiation fields. The position location and shape of the radiation fields was reviewed. They targeted volume of tissue appears to be appropriately covered by the radiation beams. Organs at risk appear to be excluded as planned.  Based on my personal review, I approved the simulation verification. The patient's treatment will proceed as planned.  -----------------------------------  Blair Promise, PhD, MD

## 2018-08-19 NOTE — Progress Notes (Signed)
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  Found to be hypoxemic with respiratory failure due to postobstructive pneumonia.  Patient was started on empiric antibiotic and oncology was consulted.  Given persistent symptoms was advised to start radiation and she was transferred to [facility] for radiation oncology evaluation.  Subjective: Patient seen and examined, she reported that her breathing is improved however continues to cough.  Remains afebrile.  No acute events overnight.  Assessment & Plan: Acute respiratory failure with hypoxia secondary to postobstructive pneumonia Somewhat improved,shortness of breath is better, she is afebrile but continues to have significant leukocytosis.  Patient currently on cefepime and Flagyl, blood cultures negative so far.  Patient is scheduled to start radiotherapy treatment this afternoon.  Continue supportive care with bronchodilators, Mucinex and incentive spirometry.  Wean oxygen as able to keep saturations above 89%.  Sepsis secondary to postobstructive pneumonia Sepsis physiology has resolved, continue empiric antibiotic therapy  Hypokalemia Replete IV, check magnesium Check BMP and magnesium in a.m.  COPD On chronic steroid, continue bronchodilators  Non-small cell cancer of the lung Recently diagnosed, by Dr. Hinton Rao at [facility].  Given symptoms and signs of postobstructive pneumonia Case was discussed with radiation oncology who recommended start radiation./14 treatment started today.  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  Consultants:   Radiation oncology  Procedures:   Radiation  Antimicrobials: Anti-infectives (From admission, onward)   Start     Dose/Rate Route Frequency Ordered Stop   08/19/18 1600  metroNIDAZOLE (FLAGYL) tablet 500 mg     500 mg Oral Every 8 hours 08/19/18 1515     08/17/18 1600  metroNIDAZOLE (FLAGYL) IVPB 500 mg  Status:  Discontinued     500 mg 100 mL/hr over 60 Minutes Intravenous Every 8 hours 08/17/18 1544 08/19/18 1515   08/17/18 1230  metroNIDAZOLE (FLAGYL) IVPB 500 mg  Status:  Discontinued     500 mg 100 mL/hr over 60 Minutes Intravenous Every 8 hours 08/17/18 1157 08/17/18 1544   08/17/18 0800  vancomycin (VANCOCIN) 500 mg in sodium chloride 0.9 % 100 mL IVPB  Status:  Discontinued     500 mg 100 mL/hr over 60 Minutes Intravenous Every 12 hours 08/16/18 2201 08/17/18 1157   08/16/18 2200  ceFEPIme (MAXIPIME) 1 g in sodium chloride 0.9 % 100 mL IVPB     1 g 200 mL/hr over 30 Minutes Intravenous Every 8 hours 08/16/18 1848 08/24/18 2359   08/16/18 2000  vancomycin (VANCOCIN) IVPB 1000 mg/200 mL premix     1,000 mg 200 mL/hr over 60 Minutes Intravenous  Once 08/16/18 1927 08/16/18 2114        Objective: Vitals:   08/18/18 2026 08/18/18 2303 08/19/18 0515 08/19/18 0916  BP: 120/62  108/62   Pulse: 94  (!) 113   Resp: 18  20   Temp: 98.7 F (37.1 C)  99.4 F (37.4 C)   TempSrc: Oral  Oral   SpO2: 92% 99% 92%  92%  Weight:        Intake/Output Summary (Last 24 hours) at 08/19/2018 1619 Last data filed at 08/19/2018 0900 Gross per 24 hour  Intake 120 ml  Output -  Net 120 ml   Filed Weights   08/16/18 1838  Weight: 55 kg    Examination:  General exam: Appears calm and comfortable  HEENT: AC/AT, PERRLA, OP moist and clear Respiratory  system: Good air entry, right lung with scattered rhonchi Cardiovascular system: S1 & S2 heard, RRR. No JVD, murmurs, rubs or gallops Gastrointestinal system: Abdomen is nondistended, soft and nontender.  Central nervous system: Alert and oriented.  Extremities: Bilateral AKA Skin: No rashes Psychiatry:  Mood & affect appropriate.    Data Reviewed: I have personally reviewed following labs and imaging studies  CBC: Recent Labs  Lab 08/17/18 0336 08/18/18 0329 08/19/18 0559  WBC 31.1* 31.7* 21.9*  NEUTROABS  --   --  19.4*  HGB 9.5* 8.7* 8.5*  HCT 29.4* 26.5* 26.0*  MCV 88.3 87.7 87.5  PLT 284 252 878   Basic Metabolic Panel: Recent Labs  Lab 08/16/18 2013 08/17/18 0336 08/18/18 0329 08/19/18 0559  NA 139 139 137 136  K 2.8* 2.5* 3.5 2.7*  CL 107 106 108 104  CO2 25 24 22  21*  GLUCOSE 137* 117* 164* 165*  BUN <5* <5* 5* 7*  CREATININE 0.52 0.51 0.58 0.57  CALCIUM 7.0* 7.1* 7.4* 7.5*  MG  --  1.4* 1.3* 1.7   GFR: CrCl cannot be calculated (Unknown ideal weight.). Liver Function Tests: Recent Labs  Lab 08/17/18 0336  AST 16  ALT 20  ALKPHOS 123  BILITOT 0.7  PROT 5.2*  ALBUMIN 1.4*   No results for input(s): LIPASE, AMYLASE in the last 168 hours. No results for input(s): AMMONIA in the last 168 hours. Coagulation Profile: No results for input(s): INR, PROTIME in the last 168 hours. Cardiac Enzymes: Recent Labs  Lab 08/17/18 0336  TROPONINI 0.03*   BNP (last 3 results) No results for input(s): PROBNP in the last 8760 hours. HbA1C: No results for input(s): HGBA1C in the last 72 hours. CBG: No results for input(s): GLUCAP in the last 168 hours. Lipid Profile: No results for input(s): CHOL, HDL, LDLCALC, TRIG, CHOLHDL, LDLDIRECT in the last 72 hours. Thyroid Function Tests: No results for input(s): TSH, T4TOTAL, FREET4, T3FREE, THYROIDAB in the last 72 hours. Anemia Panel: No results for input(s): VITAMINB12, FOLATE, FERRITIN, TIBC, IRON, RETICCTPCT  in the last 72 hours. Sepsis Labs: Recent Labs  Lab 08/18/18 0329 08/19/18 0559  PROCALCITON 1.42 1.08    Recent Results (from the past 240 hour(s))  MRSA PCR Screening     Status: None   Collection Time: 08/16/18  9:01 PM  Result Value Ref Range Status   MRSA by PCR NEGATIVE NEGATIVE Final    Comment:        The GeneXpert MRSA Assay (FDA approved for NASAL specimens only), is one component of a comprehensive MRSA colonization surveillance program. It is not intended to diagnose MRSA infection nor to guide or monitor treatment for MRSA infections. Performed at [facility], [place] [address] [place],  [address]   Culture, blood (Routine X 2) w Reflex to ID Panel     Status: None (Preliminary result)   Collection Time: 08/17/18 11:48 AM  Result Value Ref Range Status   Specimen Description BLOOD RIGHT ARM  Final   Special Requests   Final    BOTTLES DRAWN  AEROBIC ONLY Blood Culture results may not be optimal due to an inadequate volume of blood received in culture bottles   Culture   Final    NO GROWTH 2 DAYS Performed at [facility] [address], [place], [place] [address]    Report Status PENDING  Incomplete  Culture, blood (Routine X 2) w Reflex to ID Panel     Status: None (Preliminary result)   Collection Time: 08/17/18 11:56 AM  Result Value Ref Range Status   Specimen Description BLOOD RIGHT HAND  Final   Special Requests   Final    BOTTLES DRAWN AEROBIC ONLY Blood Culture results may not be optimal due to an inadequate volume of blood received in culture bottles   Culture   Final    NO GROWTH 2 DAYS Performed at [facility], [place] [address], [place], [place] [address]    Report Status PENDING  Incomplete      Radiology Studies: No results found.    Scheduled Meds: . arformoterol  15 mcg Nebulization Q12H  . budesonide  0.5 mg Nebulization BID  . enoxaparin (LOVENOX) injection  40 mg Subcutaneous Q24H  . famotidine  20 mg  Oral QHS  . feeding supplement (ENSURE ENLIVE)  237 mL Oral BID BM  . guaiFENesin  600 mg Oral BID  . metroNIDAZOLE  500 mg Oral Q8H  . montelukast  10 mg Oral QHS  . pantoprazole  40 mg Oral QAC breakfast  . predniSONE  10 mg Oral Q breakfast   Continuous Infusions: . sodium chloride 250 mL (08/18/18 1711)  . ceFEPime (MAXIPIME) IV 1 g (08/18/18 2208)  . potassium chloride 10 mEq (08/19/18 1527)     LOS: 3 days    Time spent: Total of 25 minutes spent with pt, greater than 50% of which was spent in discussion of  treatment, counseling and coordination of care    Chipper Oman, MD Pager: Text Page via [email or web address]   If 7PM-7AM, please contact night-coverage [email or web address] 08/19/2018, 4:19 PM   Note - This record has been created using [employer]. Chart creation errors have been sought, but may not always have been located. Such creation errors do not reflect on the standard of medical care.

## 2018-08-19 NOTE — Care Management Important Message (Signed)
Important Message  Patient Details  Name: Gail Moore MRN: [id number] Date of Birth: June 24, 1951   Medicare Important Message Given:  Yes    Kerin Salen 08/19/2018, 1:06 [place] Message  Patient Details  Name: Gail Moore MRN: [id number] Date of Birth: 15-Apr-1951   Medicare Important Message Given:  Yes    Kerin Salen 08/19/2018, 1:06 PM

## 2018-08-19 NOTE — Progress Notes (Signed)
CRITICAL VALUE ALERT  Critical Value:  K+ = 2.7  Date & Time Notied: 08/19/18  @ 0710  Provider Notified: yes via Amion text   Orders Received/Actions taken: pending

## 2018-08-19 NOTE — Progress Notes (Signed)
PHARMACIST - PHYSICIAN COMMUNICATION CONCERNING: Antibiotic IV to Oral Route Change Policy  RECOMMENDATION: This patient is receiving metronidazole 500 mg q8h by the intravenous route.  Based on criteria approved by the Pharmacy and Therapeutics Committee, the antibiotic(s) is/are being converted to the equivalent oral dose form(s).   DESCRIPTION: These criteria include:  Patient being treated for a respiratory tract infection, urinary tract infection, cellulitis or clostridium difficile associated diarrhea if on metronidazole  The patient is not neutropenic and does not exhibit a GI malabsorption state  The patient is eating (either orally or via tube) and/or has been taking other orally administered medications for a least 24 hours  The patient is improving clinically and has a Tmax < 100.5  If you have questions about this conversion, please contact the Pharmacy Department  []   [phone] )  [facility] []   [phone] )  [facility] Pontes  []   [phone] )  [facility] [x]   [phone] )  [place], Student PharmD 08/19/2018

## 2018-08-20 ENCOUNTER — Ambulatory Visit
Admit: 2018-08-20 | Discharge: 2018-08-20 | Disposition: A | Discharge status: Home / Self Care | Payer: Medicare Other | Attending: Radiation Oncology | Admitting: Radiation Oncology

## 2018-08-20 DIAGNOSIS — C3431 Malignant neoplasm of lower lobe, right bronchus or lung: Secondary | ICD-10-CM

## 2018-08-20 DIAGNOSIS — E43 Unspecified severe protein-calorie malnutrition: Secondary | ICD-10-CM

## 2018-08-20 DIAGNOSIS — E876 Hypokalemia: Secondary | ICD-10-CM

## 2018-08-20 DIAGNOSIS — A419 Sepsis, unspecified organism: Principal | ICD-10-CM

## 2018-08-20 DIAGNOSIS — R000 Tachycardia, unspecified: Secondary | ICD-10-CM

## 2018-08-20 LAB — BASIC METABOLIC PANEL
Anion gap: 10 (ref 5–15)
BUN: 6 mg/dL — AB (ref 8–23)
CHLORIDE: 104 mmol/L (ref 98–111)
CO2: 23 mmol/L (ref 22–32)
Calcium: 7.5 mg/dL — ABNORMAL LOW (ref 8.9–10.3)
Creatinine, Ser: 0.59 mg/dL (ref 0.44–1.00)
GFR calc Af Amer: >60 mL/min (ref >60)
GFR calc non Af Amer: >60 mL/min (ref >60)
GLUCOSE: 122 mg/dL — AB (ref 70–99)
POTASSIUM: 3 mmol/L — AB (ref 3.5–5.1)
SODIUM: 137 mmol/L (ref 135–145)

## 2018-08-20 LAB — CBC WITH DIFFERENTIAL/PLATELET
Basophils Absolute: 0 10*3/uL (ref 0.0–0.1)
Basophils Relative: 0 %
EOS PCT: 0 %
Eosinophils Absolute: 0.1 10*3/uL (ref 0.0–0.7)
HEMATOCRIT: 26.4 % — AB (ref 36.0–46.0)
HEMOGLOBIN: 8.8 g/dL — AB (ref 12.0–15.0)
LYMPHS ABS: 1.1 10*3/uL (ref 0.7–4.0)
LYMPHS PCT: 4 %
MCH: 28.9 pg (ref 26.0–34.0)
MCHC: 33.3 g/dL (ref 30.0–36.0)
MCV: 86.6 fL (ref 78.0–100.0)
MONO ABS: 1.3 10*3/uL — AB (ref 0.1–1.0)
MONOS PCT: 5 %
NEUTROS ABS: 23.1 10*3/uL (ref 1.7–7.7)
Neutrophils Relative %: 91 %
Platelets: 342 10*3/uL (ref 150–400)
RBC: 3.05 MIL/uL — ABNORMAL LOW (ref 3.87–5.11)
RDW: 15.7 % — ABNORMAL HIGH (ref 11.5–15.5)
WBC: 25.6 10*3/uL — ABNORMAL HIGH (ref 4.0–10.5)

## 2018-08-20 LAB — MAGNESIUM: MAGNESIUM: 1.6 mg/dL — AB (ref 1.7–2.4)

## 2018-08-20 MED ORDER — LEVALBUTEROL HCL 0.63 MG/3ML IN NEBU
0.6300 mg | INHALATION_SOLUTION | Freq: Four times a day (QID) | RESPIRATORY_TRACT | Status: DC
  Administered 2018-08-20: 0.63 mg via RESPIRATORY_TRACT
  Filled 2018-08-20: qty 3

## 2018-08-20 MED ORDER — GUAIFENESIN ER 600 MG PO TB12
1200.0000 mg | ORAL_TABLET | Freq: Two times a day (BID) | ORAL | Status: DC
  Administered 2018-08-20 – 2018-08-23 (×7): 1200 mg via ORAL
  Filled 2018-08-20 (×8): qty 2

## 2018-08-20 MED ORDER — POTASSIUM CHLORIDE 10 MEQ/100ML IV SOLN
10.0000 meq | INTRAVENOUS | Status: AC
  Administered 2018-08-20 (×4): 10 meq via INTRAVENOUS
  Filled 2018-08-20 (×3): qty 100

## 2018-08-20 MED ORDER — POTASSIUM CHLORIDE 10 MEQ/100ML IV SOLN
INTRAVENOUS | Status: AC
  Filled 2018-08-20: qty 100

## 2018-08-20 MED ORDER — MAGNESIUM SULFATE 2 GM/50ML IV SOLN
2.0000 g | Freq: Once | INTRAVENOUS | Status: AC
  Administered 2018-08-20: 2 g via INTRAVENOUS
  Filled 2018-08-20: qty 50

## 2018-08-20 MED ORDER — LEVALBUTEROL HCL 0.63 MG/3ML IN NEBU
0.6300 mg | INHALATION_SOLUTION | Freq: Three times a day (TID) | RESPIRATORY_TRACT | Status: DC
  Administered 2018-08-20 – 2018-08-23 (×9): 0.63 mg via RESPIRATORY_TRACT
  Filled 2018-08-20 (×9): qty 3

## 2018-08-20 NOTE — Progress Notes (Signed)
Occupational Therapy Treatment Patient Details Name: Remee Charley MRN: [id number] DOB: [birth year] Today's Date: 08/20/2018    History of present illness 67 y.o. 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 Found to have acute hypoxemic resp failure due to post-obstructive PNA, started on empiric Abx and admitted to the hospitalist service. To be transferred to [facility] for radiation on 08/18/18      Follow Up Recommendations  Home health OT;Supervision/Assistance - 24 hour    Equipment Recommendations  None recommended by OT    Recommendations for Other Services      Precautions / Restrictions Restrictions Weight Bearing Restrictions: No       Mobility Bed Mobility Overal bed mobility: Needs Assistance Bed Mobility: Rolling Rolling: Modified independent (Device/Increase time)            Transfers         NT              Balance Overall balance assessment: Needs assistance                                         ADL either performed or assessed with clinical judgement   ADL Overall ADL's : Needs assistance/impaired Eating/Feeding: Independent;Bed level Eating/Feeding Details (indicate cue type and reason): HOB raised                                   General ADL Comments: pt exhausted this day.  Pt declines sitting EOB but did agree to BUE.  Pt performing BUE AROM shoulder flexion, elbow flexion, extension and hand ROM.  Arm exercises performed to increase pts I with ADL activity.      Vision Patient Visual Report: No change from baseline            Cognition Arousal/Alertness: Awake/alert Behavior During Therapy: WFL for tasks assessed/performed Overall Cognitive Status: Within Functional Limits for tasks assessed                                                     Pertinent Vitals/  Pain       Pain Assessment: No/denies pain         Frequency  Min 2X/week        Progress Toward Goals  OT Goals(current goals can now be found in the care plan section)  Progress towards OT goals: OT to reassess next treatment(pt very fatigued this day)     Plan Discharge plan remains appropriate       AM-PAC PT "6 Clicks" Daily Activity     Outcome Measure   Help from another person eating meals?: None Help from another person taking care of personal grooming?: A Little Help from another person toileting, which includes using toliet, bedpan, or urinal?: A Lot Help from another person bathing (including washing, rinsing, drying)?: A Little Help from another person to put on and taking off regular upper body clothing?: A Little Help from another person to put on and taking off regular lower body clothing?: A Little 6 Click Score: 18  End of Session    OT Visit Diagnosis: Other abnormalities of gait and mobility (R26.89);Muscle weakness (generalized) (M62.81)   Activity Tolerance Patient tolerated treatment well;Patient limited by fatigue   Patient Left in bed;with call bell/phone within reach;with bed alarm set;with nursing/sitter in room   Nurse Communication Mobility status(pt had watery bowel movement)        Time: 6606-0045 OT Time Calculation (min): 8 min  Charges: OT General Charges $OT Visit: 1 Visit OT Treatments $Self Care/Home Management : 8-22 mins  [place], [place]   Payton Mccallum D 08/20/2018, 1:50 PM

## 2018-08-20 NOTE — Care Management Note (Signed)
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  Found to be hypoxemic with respiratory failure due to postobstructive pneumonia.  Patient was started on empiric antibiotic and oncology was consulted.  Given persistent symptoms was advised to start radiation and she was transferred to [facility] for radiation oncology evaluation.  Action/Plan: Acute respiratory failure with hypoxia secondary to postobstructive pneumonia Somewhat improved,shortness of breath is better, she is afebrile but continues to have significant leukocytosis.  Patient currently on cefepime and Flagyl, blood cultures negative so far.  Patient is scheduled to start radiotherapy treatment this afternoon.  Continue supportive care with bronchodilators, Mucinex and incentive spirometry.  Wean oxygen as able to keep saturations above 89%.  Sepsis secondary to postobstructive pneumonia Sepsis physiology has resolved, continue empiric antibiotic therapy  Hypokalemia Replete IV, check magnesium Check BMP and magnesium in a.m.  COPD On chronic steroid, continue bronchodilators  Non-small cell cancer of the lung Recently diagnosed, by Dr. Hinton R[facility] at [facility].  Given symptoms and signs of postobstructive pneumonia Case was discussed with radiation oncology who recommended start radiation./14 treatment started today.  Peripheral artery disease status post bilateral AKA  Severe malnutrition and context of chronic illness Continue nutrition supplements  DVT prophylaxis: Lovenox Code Status: Full code Family Communication: None at bedside Disposition Plan: Remains inpatient, pending improvement of  respiratory status after radiation therapy. Following for progression Following for cm needs Expected Discharge Date:                  Expected Discharge Plan:  Home/Self Care  In-House Referral:     Discharge planning Services  CM Consult  Post Acute Care Choice:    Choice offered to:     DME Arranged:    DME Agency:     HH Arranged:    HH Agency:     Status of Service:  In process, will continue to follow  If discussed at Long Length of Stay Meetings, dates discussed:    Additional Comments:  Leeroy Cha, RN 08/20/2018, 12:06 PM

## 2018-08-20 NOTE — Progress Notes (Signed)
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  Found to be hypoxemic with respiratory failure due to postobstructive pneumonia.  Patient was started on empiric antibiotic and oncology was consulted.  Given persistent symptoms was advised to start radiation and she was transferred to [facility] for radiation oncology evaluation. Slowly improving but still feels fatigued and SOB.   Assessment & Plan:   Principal Problem:   Acute respiratory failure with hypoxia (HCC) Active Problems:   HCAP (healthcare-associated pneumonia)   Lung cancer (HCC)   Hypokalemia   Tachycardia   Sepsis ([place])   Protein-calorie malnutrition, severe  Acute respiratory failure with hypoxia secondary to postobstructive pneumonia -Somewhat improved,shortness of breath is better, she is afebrile but continues to have significant leukocytosis and cough.   -Patient currently on cefepime and Flagyl, blood cultures negative so far.  -Patient starte radiotherapy treatment and today is Day 2.   -Continue supportive care with bronchodilators with Brovana and Pulmicort and added scheduled Xopenex, and increased Mucinex and incentive spirometry.   -Check Strep Pneumoniae and Legionella Pneuophila Ag -Wean oxygen as able to keep saturations above 89%.  Sepsis secondary to postobstructive pneumonia -Sepsis physiology has resolved, continue empiric antibiotic therapy -See above  Hypokalemia -Patient's K+ was 3.0 this AM -Replete with IV KCl -Continue to Monitor and Replete as Necessary -Repeat CMP In AM   Hypomagnesemia  -Patient's Mag Level this AM was 1.6 -Replete with IV Mag Sulfate 2  grams -Continue to Monitor and Replete as Necessary -Repeat Mag Level in AM   COPD -On chronic steroid with Prednisone -Continue Bronchodilators -C/w Supplemental O2 via [place] as Necessary  Non-small cell cancer of the lung -Recently diagnosed, by Dr. Hinton Rao at [facility].   -Given symptoms and signs of postobstructive pneumonia Case was discussed with radiation oncology who recommended start radiation and is getting Radiation Treatment 2/14 treatment  today.  Peripheral artery disease status post bilateral AKA -Not on any ASA or Statin   Severe malnutrition and context of chronic illness -Continue nutrition supplements with Ensure Enlive 237 mL po BID   DVT prophylaxis: Enoxaparin 40 mg sq q24h Code Status: FULL CODE Family Communication: No family present at bedside  Disposition Plan: Pending improvement of Respiratory Status and when Cleared by Radiation Therapy  Consultants:   Radiation Oncology    Procedures: XRT  Antimicrobials:  Anti-infectives (From admission, onward)   Start     Dose/Rate Route Frequency Ordered Stop   08/19/18 1600  metroNIDAZOLE (FLAGYL) tablet 500 mg     500 mg Oral Every 8 hours 08/19/18 1515     08/17/18 1600  metroNIDAZOLE (FLAGYL) IVPB 500 mg  Status:  Discontinued     500 mg 100 mL/hr over 60 Minutes Intravenous Every 8 hours 08/17/18 1544 08/19/18 1515   08/17/18 1230  metroNIDAZOLE (FLAGYL) IVPB 500 mg  Status:  Discontinued     500 mg 100 mL/hr over 60 Minutes Intravenous Every 8 hours 08/17/18 1157 08/17/18 1544   08/17/18 0800  vancomycin (VANCOCIN) 500 mg in sodium chloride 0.9 % 100 mL IVPB  Status:  Discontinued     500 mg 100 mL/hr over 60 Minutes Intravenous Every 12 hours 08/16/18  2201 08/17/18 1157   08/16/18 2200  ceFEPIme (MAXIPIME) 1 g in sodium chloride 0.9 % 100 mL IVPB     1 g 200 mL/hr over 30 Minutes Intravenous Every 8 hours 08/16/18 1848 08/24/18 2359   08/16/18 2000  vancomycin (VANCOCIN) IVPB  1000 mg/200 mL premix     1,000 mg 200 mL/hr over 60 Minutes Intravenous  Once 08/16/18 1927 08/16/18 2114     Subjective: Seen and examined at bedside and states is feeling "terrible".  States that she is still coughing however unable to bring up very productive sputum.  Still has some shortness of breath.  But denies any chest pain, heaviness or dizziness.  No other concerns or complaints at this time  Objective: Vitals:   08/19/18 1939 08/19/18 2017 08/20/18 0444 08/20/18 0534  BP: (!) 109/57  (!) 100/54   Pulse: (!) 105  (!) 128   Resp: 15  17   Temp: 98.7 F (37.1 C)  99.3 F (37.4 C)   TempSrc: Oral  Oral   SpO2: 94% 92% 95% 95%  Weight:        Intake/Output Summary (Last 24 hours) at 08/20/2018 0757 Last data filed at 08/19/2018 1836 Gross per 24 hour  Intake 1154.31 ml  Output 500 ml  Net 654.31 ml   Filed Weights   08/16/18 1838  Weight: 55 kg   Examination: Physical Exam:  Constitutional: WN/WD AAF in NAD and appears calm but uncomfortable Eyes: PERRL, lids and conjunctivae normal, sclerae anicteric  ENMT: External Ears, Nose appear normal. Grossly normal hearing.  Neck: Appears normal, supple, no cervical masses, normal ROM, no appreciable thyromegaly Respiratory: Diminished to auscultation bilaterally with some rhonchi worse on the Right. Normal respiratory effort and patient is not tachypenic. No accessory muscle use.  Cardiovascular: Tachycardic Rate but regular rhythm,  No extremity edema. Abdomen: Soft, non-tender, non-distended. Bowel sounds positive x4.  GU: Deferred. Musculoskeletal: Bilateral AKA Skin: No rashes on a limited skin eval. No induration; Warm and dry.  Neurologic: CN 2-12 grossly intact with no focal deficits. Romberg sign and cerebellar reflexes not assessed.  Psychiatric: Normal judgment and insight. Alert and oriented x 3. Normal mood and appropriate affect.   Data Reviewed: I have personally reviewed following labs and imaging  studies  CBC: Recent Labs  Lab 08/17/18 0336 08/18/18 0329 08/19/18 0559 08/20/18 0547  WBC 31.1* 31.7* 21.9* 25.6*  NEUTROABS  --   --  19.4* 23.1  HGB 9.5* 8.7* 8.5* 8.8*  HCT 29.4* 26.5* 26.0* 26.4*  MCV 88.3 87.7 87.5 86.6  PLT 284 252 291 387   Basic Metabolic Panel: Recent Labs  Lab 08/16/18 2013 08/17/18 0336 08/18/18 0329 08/19/18 0559 08/20/18 0547  NA 139 139 137 136 137  K 2.8* 2.5* 3.5 2.7* 3.0*  CL 107 106 108 104 104  CO2 25 24 22  21* 23  GLUCOSE 137* 117* 164* 165* 122*  BUN <5* <5* 5* 7* 6*  CREATININE 0.52 0.51 0.58 0.57 0.59  CALCIUM 7.0* 7.1* 7.4* 7.5* 7.5*  MG  --  1.4* 1.3* 1.7 1.6*   GFR: CrCl cannot be calculated (Unknown ideal weight.). Liver Function Tests: Recent Labs  Lab 08/17/18 0336  AST 16  ALT 20  ALKPHOS 123  BILITOT 0.7  PROT 5.2*  ALBUMIN 1.4*   No results for input(s): LIPASE, AMYLASE in the last 168 hours. No results for input(s): AMMONIA in the last 168 hours. Coagulation Profile: No results for input(s): INR, PROTIME in the last  168 hours. Cardiac Enzymes: Recent Labs  Lab 08/17/18 0336  TROPONINI 0.03*   BNP (last 3 results) No results for input(s): PROBNP in the last 8760 hours. HbA1C: No results for input(s): HGBA1C in the last 72 hours. CBG: No results for input(s): GLUCAP in the last 168 hours. Lipid Profile: No results for input(s): CHOL, HDL, LDLCALC, TRIG, CHOLHDL, LDLDIRECT in the last 72 hours. Thyroid Function Tests: No results for input(s): TSH, T4TOTAL, FREET4, T3FREE, THYROIDAB in the last 72 hours. Anemia Panel: No results for input(s): VITAMINB12, FOLATE, FERRITIN, TIBC, IRON, RETICCTPCT in the last 72 hours. Sepsis Labs: Recent Labs  Lab 08/18/18 0329 08/19/18 0559  PROCALCITON 1.42 1.08   Recent Results (from the past 240 hour(s))  MRSA PCR Screening     Status: None   Collection Time: 08/16/18  9:01 PM  Result Value Ref Range Status   MRSA by PCR NEGATIVE NEGATIVE Final     Comment:        The GeneXpert MRSA Assay (FDA approved for NASAL specimens only), is one component of a comprehensive MRSA colonization surveillance program. It is not intended to diagnose MRSA infection nor to guide or monitor treatment for MRSA infections. Performed at [facility], [place] [address] [place], [place] [address]   Culture, blood (Routine X 2) w Reflex to ID Panel     Status: None (Preliminary result)   Collection Time: 08/17/18 11:48 AM  Result Value Ref Range Status   Specimen Description BLOOD RIGHT ARM  Final   Special Requests   Final    BOTTLES DRAWN AEROBIC ONLY Blood Culture results may not be optimal due to an inadequate volume of blood received in culture bottles   Culture   Final    NO GROWTH 3 DAYS Performed at [facility], [place] [address], [place], [place] [address]    Report Status PENDING  Incomplete  Culture, blood (Routine X 2) w Reflex to ID Panel     Status: None (Preliminary result)   Collection Time: 08/17/18 11:56 AM  Result Value Ref Range Status   Specimen Description BLOOD RIGHT HAND  Final   Special Requests   Final    BOTTLES DRAWN AEROBIC ONLY Blood Culture results may not be optimal due to an inadequate volume of blood received in culture bottles   Culture   Final    NO GROWTH 3 DAYS Performed at [facility], [place] [address] [place], [place] [address]    Report Status PENDING  Incomplete    Radiology Studies: No results found.  Scheduled Meds: . arformoterol  15 mcg Nebulization Q12H  . budesonide  0.5 mg Nebulization BID  . enoxaparin (LOVENOX) injection  40 mg Subcutaneous Q24H  . famotidine  20 mg Oral QHS  . feeding supplement (ENSURE ENLIVE)  237 mL Oral BID BM  . guaiFENesin  600 mg Oral BID  . metroNIDAZOLE  500 mg Oral Q8H  . montelukast  10 mg Oral QHS  . pantoprazole  40 mg Oral QAC breakfast  . predniSONE  10 mg Oral Q breakfast   Continuous Infusions: . sodium chloride 250 mL (08/18/18 1711)   . ceFEPime (MAXIPIME) IV 1 g (08/19/18 2326)    LOS: 4 days   Kerney Elbe, DO [facility] PAGER is on [place]  If 7PM-7AM, please contact night-coverage [email or web address] Password [facility] 08/20/2018, 7:57 AM

## 2018-08-20 NOTE — Progress Notes (Signed)
  Radiation Oncology         [phone] ________________________________  Name: Gail Moore MRN: [id number]  Date: 08/19/2018  DOB: 04-Dec-1951  SIMULATION AND TREATMENT PLANNING NOTE - inpatient    ICD-10-CM   1. 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  Identity was confirmed.  All relevant records and images related to the planned course of therapy were reviewed.  The patient freely provided informed written consent to proceed with treatment after reviewing the details related to the planned course of therapy. The consent form was witnessed and verified by the simulation staff.  Then, the patient was set-up in a stable reproducible  supine position for radiation therapy.  CT images were obtained.  Surface markings were placed.  The CT images were loaded into the planning software.  Then the target and avoidance structures were contoured.  Treatment planning then occurred.  The radiation prescription was entered and confirmed.  Then, I designed and supervised the construction of a total of 5 medically necessary complex treatment devices.  I have requested : 3D Simulation  I have requested a DVH of the following structures: GTV, PTV the heart, lungs, spinal cord.  I have ordered:dose calc.  PLAN:  The patient will receive 35 Gy in 14 fractions. At a later date the patient will be evaluated for a boost. If her right lower lung expands sufficiently may consider re-simulation early.  -----------------------------------  Blair Promise, PhD, MD

## 2018-08-20 NOTE — Progress Notes (Signed)
PT Cancellation Note  Patient Details Name: Gail Moore MRN: [id number] DOB: [birth year]   Cancelled Treatment:     Pt resting, room dark.  Noted WBC HIGH .  Pt has been evaluated with rec for [facility] PT   Rica Koyanagi  PTA [facility]  Acute  Rehab Pager      [phone]

## 2018-08-21 ENCOUNTER — Inpatient Hospital Stay (HOSPITAL_COMMUNITY): Payer: Medicare Other

## 2018-08-21 ENCOUNTER — Ambulatory Visit
Admit: 2018-08-21 | Discharge: 2018-08-21 | Disposition: A | Discharge status: Home / Self Care | Payer: Medicare Other | Attending: Radiation Oncology | Admitting: Radiation Oncology

## 2018-08-21 LAB — CBC WITH DIFFERENTIAL/PLATELET
Basophils Absolute: 0 10*3/uL (ref 0.0–0.1)
Basophils Relative: 0 %
EOS ABS: 0.2 10*3/uL (ref 0.0–0.7)
Eosinophils Relative: 1 %
HEMATOCRIT: 26.2 % — AB (ref 36.0–46.0)
HEMOGLOBIN: 8.8 g/dL — AB (ref 12.0–15.0)
LYMPHS ABS: 0.9 10*3/uL (ref 0.7–4.0)
Lymphocytes Relative: 4 %
MCH: 28.9 pg (ref 26.0–34.0)
MCHC: 33.6 g/dL (ref 30.0–36.0)
MCV: 86.2 fL (ref 78.0–100.0)
MONOS PCT: 4 %
Monocytes Absolute: 0.9 10*3/uL (ref 0.1–1.0)
NEUTROS ABS: 21.5 10*3/uL — AB (ref 1.7–7.7)
NEUTROS PCT: 91 %
Platelets: 368 10*3/uL (ref 150–400)
RBC: 3.04 MIL/uL — ABNORMAL LOW (ref 3.87–5.11)
RDW: 15.7 % — ABNORMAL HIGH (ref 11.5–15.5)
WBC: 23.4 10*3/uL — ABNORMAL HIGH (ref 4.0–10.5)

## 2018-08-21 LAB — COMPREHENSIVE METABOLIC PANEL
ALT: 9 U/L (ref 0–44)
AST: 8 U/L — ABNORMAL LOW (ref 15–41)
Albumin: 1.7 g/dL — ABNORMAL LOW (ref 3.5–5.0)
Alkaline Phosphatase: 99 U/L (ref 38–126)
Anion gap: 9 (ref 5–15)
BILIRUBIN TOTAL: 0.7 mg/dL (ref 0.3–1.2)
BUN: 7 mg/dL — ABNORMAL LOW (ref 8–23)
CO2: 24 mmol/L (ref 22–32)
Calcium: 7.8 mg/dL — ABNORMAL LOW (ref 8.9–10.3)
Chloride: 105 mmol/L (ref 98–111)
Creatinine, Ser: 0.49 mg/dL (ref 0.44–1.00)
Glucose, Bld: 134 mg/dL — ABNORMAL HIGH (ref 70–99)
Potassium: 3.1 mmol/L — ABNORMAL LOW (ref 3.5–5.1)
Sodium: 138 mmol/L (ref 135–145)
TOTAL PROTEIN: 5.7 g/dL — AB (ref 6.5–8.1)

## 2018-08-21 LAB — MAGNESIUM: Magnesium: 2 mg/dL (ref 1.7–2.4)

## 2018-08-21 LAB — PHOSPHORUS: Phosphorus: 2.4 mg/dL — ABNORMAL LOW (ref 2.5–4.6)

## 2018-08-21 MED ORDER — FUROSEMIDE 10 MG/ML IJ SOLN
20.0000 mg | Freq: Once | INTRAMUSCULAR | Status: AC
  Administered 2018-08-21: 20 mg via INTRAVENOUS
  Filled 2018-08-21: qty 2

## 2018-08-21 MED ORDER — POTASSIUM PHOSPHATES 15 MMOLE/5ML IV SOLN
10.0000 mmol | Freq: Once | INTRAVENOUS | Status: AC
  Administered 2018-08-21: 10 mmol via INTRAVENOUS
  Filled 2018-08-21: qty 3.33

## 2018-08-21 MED ORDER — POTASSIUM CHLORIDE 10 MEQ/100ML IV SOLN
10.0000 meq | INTRAVENOUS | Status: AC
  Administered 2018-08-21 (×4): 10 meq via INTRAVENOUS
  Filled 2018-08-21: qty 100

## 2018-08-21 MED ORDER — POTASSIUM CHLORIDE CRYS ER 20 MEQ PO TBCR
40.0000 meq | EXTENDED_RELEASE_TABLET | Freq: Once | ORAL | Status: AC
  Administered 2018-08-21: 40 meq via ORAL
  Filled 2018-08-21: qty 2

## 2018-08-21 MED ORDER — HYDROCODONE-HOMATROPINE 5-1.5 MG/5ML PO SYRP
5.0000 mL | ORAL_SOLUTION | Freq: Four times a day (QID) | ORAL | Status: DC | PRN
  Administered 2018-08-21 – 2018-08-23 (×2): 5 mL via ORAL
  Filled 2018-08-21 (×2): qty 5

## 2018-08-21 MED ORDER — HYDROCOD POLST-CPM POLST ER 10-8 MG/5ML PO SUER
5.0000 mL | Freq: Once | ORAL | Status: AC
  Administered 2018-08-21: 5 mL via ORAL
  Filled 2018-08-21: qty 5

## 2018-08-21 MED ORDER — METHYLPREDNISOLONE SODIUM SUCC 40 MG IJ SOLR
40.0000 mg | Freq: Three times a day (TID) | INTRAMUSCULAR | Status: DC
  Administered 2018-08-21 – 2018-08-22 (×4): 40 mg via INTRAVENOUS
  Filled 2018-08-21 (×4): qty 1

## 2018-08-21 NOTE — Care Management Note (Signed)
Case Management Note  Patient Details  Name: Gail Moore MRN: [id number] Date of Birth: 12-17-1951  Subjective/Objective:        progression and symptoms from chart          Wbc-23.4/hgb-8.8/hct 26.2 Iv ns at kvo/iv maxipime 1gm q8hrs/iv kcl 51meg in 100 ns q1hr x4  Action/Plan: following for progression-wbc trending downward/bld. Cultures-no growth x4days Cm needs patient still too ill for dc will follow Expected Discharge Date:                  Expected Discharge Plan:  Home/Self Care  In-House Referral:     Discharge planning Services  CM Consult  Post Acute Care Choice:    Choice offered to:     DME Arranged:    DME Agency:     HH Arranged:    HH Agency:     Status of Service:  In process, will continue to follow  If discussed at Long Length of Stay Meetings, dates discussed:    Additional Comments:  Leeroy Cha, RN 08/21/2018, 12:07 PM

## 2018-08-21 NOTE — Progress Notes (Addendum)
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  Found to be hypoxemic with respiratory failure due to postobstructive pneumonia.  Patient was started on empiric antibiotic and oncology was consulted.  Given persistent symptoms was advised to start radiation and she was transferred to [facility] for radiation oncology evaluation. Slowly improving but still feels fatigued and SOB and has a persistent non-productive cough.   Assessment & Plan:   Principal Problem:   Acute respiratory failure with hypoxia (HCC) Active Problems:   HCAP (healthcare-associated pneumonia)   Lung cancer (HCC)   Hypokalemia   Tachycardia   Sepsis ([place])   Protein-calorie malnutrition, severe  Acute Respiratory failure with hypoxia secondary to Postobstructive pneumonia, Improved -Somewhat improved,shortness of breath is better, she is afebrile but continues to have significant leukocytosis and cough. Cough is persistent    -Patient is currently on IV Cefepime and po Flagyl, blood cultures negative so far.  -Patient started radiotherapy treatment and had 3rd Treatment today  -Continue supportive care with bronchodilators with Brovana and Pulmicort and added scheduled Xopenex, and increased Mucinex to 1200 mg po BID and incentive spirometry along with Flutter Valve.   -Check Strep Pneumoniae and Legionella Pneuophila Ag -Wean oxygen as able to keep saturations above 89%. -Now on Room Air -C/w Nebs as above -Added Hycodan  5 mL q6hprn Cough -Had Rhonchi and Wheezing and so stopped po Prednisone and will start IV Solumedrol 40 mg q8h and wean as tolerated  -Procalcitonin is trending down and went  from 1.42 -> 1.08  Sepsis secondary to Postobstructive pneumonia -Sepsis physiology has resolved; Continue Empiric antibiotic therapy -CXR this AM showed: "Cardiac shadow is mildly enlarged. The aorta again demonstrates atherosclerotic calcifications. Large right paratracheal mass lesion is noted shown as metastatic adenopathy on recent CT examination. Consolidation in the right lower lobe is again noted consistent with the known mass lesion stable from previous CT. The left lung is clear. No bony abnormality is noted." -See above  Hypokalemia -Patient's K+ was 3.1 this AM -Replete with IV KCl 40 mEQ and with po Potassium Chloride 40 mEQ; Also gave 10 mmol of IV KPhos -Continue to Monitor and Replete as Necessary -Repeat CMP In AM   Hypomagnesemia  -Patient's Mag Level was 1.6 and is now improved to 2.0 -Replete with IV Mag Sulfate 2 grams -Continue to Monitor and Replete as Necessary -Repeat Mag Level in AM   Hypophosphatemia -Patient's phosphorus level this morning was 2.4 -Replete with IV 10 mmol of K-Phos -Continue monitor and replete as necessary -Repeat phosphorus level in the a.m.  COPD -On chronic steroid with Prednisone but will stop currently and start on IV Methylprednisolone 40 mg q8h -Continue Bronchodilators -C/w Supplemental O2 via [place] as Necessary  Non-small cell cancer of the lung -Recently diagnosed, by Dr. Hinton Rao at [facility].   -Given symptoms and signs of postobstructive pneumonia Case was discussed with radiation oncology who recommended start radiation and is getting Radiation Treatment 3/14 treatment today.  Peripheral artery disease status post bilateral AKA -Not on any ASA or Statin   Severe malnutrition and context of chronic illness -Continue nutrition supplements with Ensure Enlive 237 mL po BID   DVT prophylaxis:  Enoxaparin 40 mg sq q24h Code Status: FULL CODE Family Communication: No family present at bedside  Disposition  Plan: Pending improvement of Respiratory Status and when Cleared by Radiation Therapy and anticipate in the next few days; Will get PT/OT to evaluate and treat   Consultants:   Radiation Oncology    Procedures: XRT  Antimicrobials:  Anti-infectives (From admission, onward)   Start     Dose/Rate Route Frequency Ordered Stop   08/19/18 1600  metroNIDAZOLE (FLAGYL) tablet 500 mg     500 mg Oral Every 8 hours 08/19/18 1515     08/17/18 1600  metroNIDAZOLE (FLAGYL) IVPB 500 mg  Status:  Discontinued     500 mg 100 mL/hr over 60 Minutes Intravenous Every 8 hours 08/17/18 1544 08/19/18 1515   08/17/18 1230  metroNIDAZOLE (FLAGYL) IVPB 500 mg  Status:  Discontinued     500 mg 100 mL/hr over 60 Minutes Intravenous Every 8 hours 08/17/18 1157 08/17/18 1544   08/17/18 0800  vancomycin (VANCOCIN) 500 mg in sodium chloride 0.9 % 100 mL IVPB  Status:  Discontinued     500 mg 100 mL/hr over 60 Minutes Intravenous Every 12 hours 08/16/18 2201 08/17/18 1157   08/16/18 2200  ceFEPIme (MAXIPIME) 1 g in sodium chloride 0.9 % 100 mL IVPB     1 g 200 mL/hr over 30 Minutes Intravenous Every 8 hours 08/16/18 1848 08/24/18 2359   08/16/18 2000  vancomycin (VANCOCIN) IVPB 1000 mg/200 mL premix     1,000 mg 200 mL/hr over 60 Minutes Intravenous  Once 08/16/18 1927 08/16/18 2114     Subjective: Seen and examined at bedside and  Again was not feeling well and stated her night was "rough."  She was still persistently coughing and not be able to bring up any sputum.  Denies any chest pressure lightheadedness or dizziness but states that she still feels somewhat short of breath even though she is not wearing oxygen.  No other concerns or complaints at this time with that her lab numbers are slightly improving.  Objective: Vitals:   08/20/18 2049 08/21/18 0448 08/21/18 0500 08/21/18 0814  BP: 116/64 109/61    Pulse: (!) 103 (!) 124 88   Resp: (!) 22 (!) 24    Temp: 98 F (36.7 C) 99.1 F (37.3 C)      TempSrc: Oral Oral    SpO2: 96% 100%  90%  Weight:        Intake/Output Summary (Last 24 hours) at 08/21/2018 1231 Last data filed at 08/21/2018 0500 Gross per 24 hour  Intake -  Output 1100 ml  Net -1100 ml   Filed Weights   08/16/18 1838  Weight: 55 kg   Examination: Physical Exam:  Constitutional: Well-nourished, well-developed African-American female who is currently in mild distress coughing appears calm but still appears uncomfortable and wants to rest Eyes: Conjunctive are normal.  Sclera anicteric ENMT: External ears and nose appear normal.  Grossly normal hearing Neck: Appears supple with no JVD. Respiratory: Diminished to auscultation bilaterally with some diffuse rhonchi and mild wheezing bilaterally.  Has a slightly increased respiratory effort but is not using any accessory muscles to breathe. has a dry nonproductive cough noted as well. Cardiovascular: Regular rate and rhythm with no appreciable murmurs, rubs, gallops.  No upper extremity edema noted Abdomen: Soft, nontender, nondistended.  Bowel sounds present all 4 quadrants GU: Deferred Musculoskeletal: Bilateral AKA's Skin: No rashes or lesions on limited skin evaluation.  Skin is warm and dry  Neurologic: Cranial nerves II through XII grossly intact no appreciable focal deficits. Psychiatric: Normal judgment and insight.  Patient is awake and alert and oriented x3.  Slightly anxious but has no appropriate affect  Data Reviewed: I have personally reviewed following labs and imaging studies  CBC: Recent Labs  Lab 08/17/18 0336 08/18/18 0329 08/19/18 0559 08/20/18 0547 08/21/18 0542  WBC 31.1* 31.7* 21.9* 25.6* 23.4*  NEUTROABS  --   --  19.4* 23.1 21.5*  HGB 9.5* 8.7* 8.5* 8.8* 8.8*  HCT 29.4* 26.5* 26.0* 26.4* 26.2*  MCV 88.3 87.7 87.5 86.6 86.2  PLT 284 252 291 342 528   Basic Metabolic Panel: Recent Labs  Lab 08/17/18 0336 08/18/18 0329 08/19/18 0559 08/20/18 0547 08/21/18 0542  NA 139 137  136 137 138  K 2.5* 3.5 2.7* 3.0* 3.1*  CL 106 108 104 104 105  CO2 24 22 21* 23 24  GLUCOSE 117* 164* 165* 122* 134*  BUN <5* 5* 7* 6* 7*  CREATININE 0.51 0.58 0.57 0.59 0.49  CALCIUM 7.1* 7.4* 7.5* 7.5* 7.8*  MG 1.4* 1.3* 1.7 1.6* 2.0  PHOS  --   --   --   --  2.4*   GFR: CrCl cannot be calculated (Unknown ideal weight.). Liver Function Tests: Recent Labs  Lab 08/17/18 0336 08/21/18 0542  AST 16 8*  ALT 20 9  ALKPHOS 123 99  BILITOT 0.7 0.7  PROT 5.2* 5.7*  ALBUMIN 1.4* 1.7*   No results for input(s): LIPASE, AMYLASE in the last 168 hours. No results for input(s): AMMONIA in the last 168 hours. Coagulation Profile: No results for input(s): INR, PROTIME in the last 168 hours. Cardiac Enzymes: Recent Labs  Lab 08/17/18 0336  TROPONINI 0.03*   BNP (last 3 results) No results for input(s): PROBNP in the last 8760 hours. HbA1C: No results for input(s): HGBA1C in the last 72 hours. CBG: No results for input(s): GLUCAP in the last 168 hours. Lipid Profile: No results for input(s): CHOL, HDL, LDLCALC, TRIG, CHOLHDL, LDLDIRECT in the last 72 hours. Thyroid Function Tests: No results for input(s): TSH, T4TOTAL, FREET4, T3FREE, THYROIDAB in the last 72 hours. Anemia Panel: No results for input(s): VITAMINB12, FOLATE, FERRITIN, TIBC, IRON, RETICCTPCT in the last 72 hours. Sepsis Labs: Recent Labs  Lab 08/18/18 0329 08/19/18 0559  PROCALCITON 1.42 1.08   Recent Results (from the past 240 hour(s))  MRSA PCR Screening     Status: None   Collection Time: 08/16/18  9:01 PM  Result Value Ref Range Status   MRSA by PCR NEGATIVE NEGATIVE Final    Comment:        The GeneXpert MRSA Assay (FDA approved for NASAL specimens only), is one component of a comprehensive MRSA colonization surveillance program. It is not intended to diagnose MRSA infection nor to guide or monitor treatment for MRSA infections. Performed at [facility], [place] [address] [place],  [place] [address]   Culture, blood (Routine X 2) w Reflex to ID Panel     Status: None (Preliminary result)   Collection Time: 08/17/18 11:48 AM  Result Value Ref Range Status   Specimen Description BLOOD RIGHT ARM  Final   Special Requests   Final    BOTTLES DRAWN AEROBIC ONLY Blood Culture results may not be optimal due to an inadequate volume of blood received in culture bottles   Culture   Final    NO GROWTH 4 DAYS Performed at [facility], [place]  [address] [place], [place] [address]    Report Status PENDING  Incomplete  Culture, blood (Routine X 2) w Reflex to ID Panel     Status: None (Preliminary result)   Collection Time: 08/17/18 11:56 AM  Result Value Ref Range Status   Specimen Description BLOOD RIGHT HAND  Final   Special Requests   Final    BOTTLES DRAWN AEROBIC ONLY Blood Culture results may not be optimal due to an inadequate volume of blood received in culture bottles   Culture   Final    NO GROWTH 4 DAYS Performed at [facility], [place] [address], [place], [place] [address]    Report Status PENDING  Incomplete    Radiology Studies: Dg Chest Port 1 View  Result Date: 08/21/2018 CLINICAL DATA:  Shortness of breath EXAM: PORTABLE CHEST 1 VIEW COMPARISON:  Previous CT from 08/16/2018 FINDINGS: Cardiac shadow is mildly enlarged. The aorta again demonstrates atherosclerotic calcifications. Large right paratracheal mass lesion is noted shown as metastatic adenopathy on recent CT examination. Consolidation in the right lower lobe is again noted consistent with the known mass lesion stable from previous CT. The left lung is clear. No bony abnormality is noted. IMPRESSION: Persistent changes on the right similar to that seen on prior CT examination. Electronically Signed   By: Inez Catalina M.D.   On: 08/21/2018 10:53   Scheduled Meds: . arformoterol  15 mcg Nebulization Q12H  . budesonide  0.5 mg Nebulization BID  . enoxaparin (LOVENOX) injection  40 mg Subcutaneous Q24H  .  famotidine  20 mg Oral QHS  . feeding supplement (ENSURE ENLIVE)  237 mL Oral BID BM  . guaiFENesin  1,200 mg Oral BID  . levalbuterol  0.63 mg Nebulization TID  . methylPREDNISolone (SOLU-MEDROL) injection  40 mg Intravenous Q8H  . metroNIDAZOLE  500 mg Oral Q8H  . montelukast  10 mg Oral QHS  . pantoprazole  40 mg Oral QAC breakfast   Continuous Infusions: . sodium chloride 250 mL (08/18/18 1711)  . ceFEPime (MAXIPIME) IV 1 g (08/21/18 0836)  . potassium PHOSPHATE IVPB (in mmol)      LOS: 5 days   Kerney Elbe, DO [facility] PAGER is on [place]  If 7PM-7AM, please contact night-coverage [email or web address] Password [facility] 08/21/2018, 12:31 PM

## 2018-08-22 ENCOUNTER — Inpatient Hospital Stay (HOSPITAL_COMMUNITY): Payer: Medicare Other

## 2018-08-22 ENCOUNTER — Ambulatory Visit
Admit: 2018-08-22 | Discharge: 2018-08-22 | Disposition: A | Discharge status: Home / Self Care | Payer: Medicare Other | Attending: Radiation Oncology | Admitting: Radiation Oncology

## 2018-08-22 LAB — CBC WITH DIFFERENTIAL/PLATELET
Basophils Absolute: 0 K/uL (ref 0.0–0.1)
Basophils Relative: 0 %
Eosinophils Absolute: 0 K/uL (ref 0.0–0.7)
Eosinophils Relative: 0 %
HCT: 26.4 % — ABNORMAL LOW (ref 36.0–46.0)
Hemoglobin: 8.6 g/dL — ABNORMAL LOW (ref 12.0–15.0)
Lymphocytes Relative: 2 %
Lymphs Abs: 0.5 K/uL — ABNORMAL LOW (ref 0.7–4.0)
MCH: 28.3 pg (ref 26.0–34.0)
MCHC: 32.6 g/dL (ref 30.0–36.0)
MCV: 86.8 fL (ref 78.0–100.0)
Monocytes Absolute: 0.3 K/uL (ref 0.1–1.0)
Monocytes Relative: 1 %
Neutro Abs: 20.1 K/uL — ABNORMAL HIGH (ref 1.7–7.7)
Neutrophils Relative %: 97 %
Platelets: 344 K/uL (ref 150–400)
RBC: 3.04 MIL/uL — ABNORMAL LOW (ref 3.87–5.11)
RDW: 15.9 % — ABNORMAL HIGH (ref 11.5–15.5)
WBC: 20.9 K/uL — ABNORMAL HIGH (ref 4.0–10.5)

## 2018-08-22 LAB — COMPREHENSIVE METABOLIC PANEL WITH GFR
ALT: 9 U/L (ref 0–44)
AST: 11 U/L — ABNORMAL LOW (ref 15–41)
Albumin: 1.6 g/dL — ABNORMAL LOW (ref 3.5–5.0)
Alkaline Phosphatase: 115 U/L (ref 38–126)
Anion gap: 7 (ref 5–15)
BUN: 10 mg/dL (ref 8–23)
CO2: 24 mmol/L (ref 22–32)
Calcium: 7.7 mg/dL — ABNORMAL LOW (ref 8.9–10.3)
Chloride: 103 mmol/L (ref 98–111)
Creatinine, Ser: 0.49 mg/dL (ref 0.44–1.00)
GFR calc Af Amer: >60 mL/min
GFR calc non Af Amer: >60 mL/min
Glucose, Bld: 288 mg/dL — ABNORMAL HIGH (ref 70–99)
Potassium: 4 mmol/L (ref 3.5–5.1)
Sodium: 134 mmol/L — ABNORMAL LOW (ref 135–145)
Total Bilirubin: 0.4 mg/dL (ref 0.3–1.2)
Total Protein: 5.4 g/dL — ABNORMAL LOW (ref 6.5–8.1)

## 2018-08-22 LAB — CULTURE, BLOOD (ROUTINE X 2)
CULTURE: NO GROWTH
CULTURE: NO GROWTH

## 2018-08-22 LAB — STREP PNEUMONIAE URINARY ANTIGEN: Strep Pneumo Urinary Antigen: NEGATIVE

## 2018-08-22 LAB — PHOSPHORUS: Phosphorus: 2.8 mg/dL (ref 2.5–4.6)

## 2018-08-22 LAB — MAGNESIUM: Magnesium: 1.7 mg/dL (ref 1.7–2.4)

## 2018-08-22 MED ORDER — METHYLPREDNISOLONE SODIUM SUCC 40 MG IJ SOLR
40.0000 mg | Freq: Two times a day (BID) | INTRAMUSCULAR | Status: DC
  Administered 2018-08-22 – 2018-08-23 (×2): 40 mg via INTRAVENOUS
  Filled 2018-08-22 (×2): qty 1

## 2018-08-22 NOTE — Care Management Note (Signed)
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 Found to be hypoxemic with respiratory failure due to postobstructive pneumonia. Patient was started on empiric antibiotic and oncology was consulted. Given persistent symptoms was advised to start radiation and she was transferred to [facility] for radiation oncology evaluation. Slowly improving but still feels fatigued and SOB and has a persistent non-productive cough.  Principal Problem:   Acute respiratory failure with hypoxia (HCC) Active Problems:   HCAP (healthcare-associated pneumonia)   Lung cancer ([place])   Hypokalemia   Tachycardia   Sepsis ([place])   Protein-calorie malnutrition, severe Action/Plan:  Continued inpatient stay is needed until 1 or more of the following are present: ? Acceptable patient status for next level of care is achieved. ? ALL of the following are present:   Respiratory status acceptable ats at 92% on r.a.   Right heart function adequate /a   Temperature status acceptable es   No infection, or status acceptable [id number]   Stable chest findings nundergoing CXRT   Pain and nausea absent or adequately managed managed   Isolation not needed, or status acceptable n/a   No chest tube, or status acceptable n/a   Activity level acceptable yes   Intake acceptable poor  xpected Discharge Date:                  Expected Discharge Plan:  Home/Self Care  In-House Referral:     Discharge planning Services  CM Consult  Post Acute Care Choice:    Choice offered to:     DME Arranged:    DME Agency:     HH Arranged:    HH Agency:     Status of Service:  In  process, will continue to follow  If discussed at Long Length of Stay Meetings, dates discussed:    Additional Comments:  Leeroy Cha, RN 08/22/2018, 11:13 AM

## 2018-08-22 NOTE — Progress Notes (Signed)
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  Found to be hypoxemic with respiratory failure due to postobstructive pneumonia.  Patient was started on empiric antibiotic and oncology was consulted.  Given persistent symptoms was advised to start radiation and she was transferred to [facility] for radiation oncology evaluation. Slowly improving but still felt fatigued and SOB with as persistent non-productive cough. Cough is improving and is now starting to become productive and patient thinks Radiation is helping as she is starting to feel better and steroids are being weaned.  Assessment & Plan:   Principal Problem:   Acute respiratory failure with hypoxia (HCC) Active Problems:   HCAP (healthcare-associated pneumonia)   Lung cancer (HCC)   Hypokalemia   Tachycardia   Sepsis ([place])   Protein-calorie malnutrition, severe  Acute Respiratory failure with hypoxia secondary to Postobstructive pneumonia, Improved -Somewhat improved,shortness of breath is better, she is afebrile but continues to have significant leukocytosis and cough. Cough is persistent    -Patient is currently on IV Cefepime and po Flagyl, blood cultures negative so far.  -Patient started radiotherapy treatment and had 4th Treatment today  -Continue supportive care with bronchodilators with Brovana and Pulmicort and added scheduled Xopenex, and increased Mucinex to 1200 mg po BID and incentive spirometry along with Flutter Valve.   -Check Strep Pneumoniae and Legionella Pneuophila Ag and pending; Strep Pneumo Urine Ag Negative  -Wean oxygen as able to keep saturations above 89%. -Now on Room Air -C/w  Nebs as above -Added Hycodan  5 mL q6hprn Cough -Had Rhonchi and Wheezing and so stopped po Prednisone and started IV Solumedrol 40 mg q8h and will wean to 40 mg q12h -Procalcitonin is trending down and went from 1.42 -> 1.08 -WBCs and also trending down to 1.7 is now 20.9 -Continue to monitor and repeat CBC in a.m.  Sepsis secondary to Postobstructive pneumonia -Sepsis physiology has resolved; Continue Empiric antibiotic therapy as above  -Repeat CXR this AM showed."Stable changes of right hilar mass with peripheral consolidation and mediastinal adenopathy. No new focal abnormality is seen." -See above -Awaiting PT and OT evaluations  Hypokalemia -Patient's K+ was 3.1 and improved to 4.0 -Replete with IV KCl 40 mEQ and with po Potassium Chloride 40 mEQ; Also gave 10 mmol of IV KPhos yesterday  -Continue to Monitor and Replete as Necessary -Repeat CMP In AM   Hypomagnesemia  -Patient's Mag Level was 1.6 and is now improved to 1.7 -Replete with IV Mag Sulfate 2 grams yesterday -Continue to Monitor and Replete as Necessary -Repeat Mag Level in AM   Hypophosphatemia -Patient's phosphorus level was 2.4 and improved to 2.8 -Replete with IV 10 mmol of K-Phos yesterday  -Continue monitor and replete as necessary -Repeat phosphorus level in the a.m.  COPD -On chronic steroid with Prednisone but will stop currently and start on IV Methylprednisolone 40 mg q8h and cut down to 40 mg q12h -Continue Bronchodilators -C/w Supplemental O2 via [place] as Necessary  Non-small cell cancer of the lung -Recently diagnosed, by Dr. Hinton Rao at [facility].   -Given symptoms and signs of postobstructive pneumonia Case was discussed with radiation oncology who recommended start radiation and is getting Radiation Treatment  4/14 treatment today.  Peripheral artery disease status post bilateral AKA -Not on any ASA or Statin   Severe malnutrition and context of chronic illness -Continue  nutrition supplements with Ensure Enlive 237 mL po BID  -Nutritionist Consulted and appreciate furtehr recommendations and evaluation  Hyponatremia -Mild at 134 and down from 138 -In the setting of IV Lasix Use -Continue to Monitor and Trend  -Repeat CMP in AM   Normocytic Anemia -Patient's Hb/Hct appears stable and is now 8.6/26.4 -Continue to Monitor for S/Sx of Bleeding -Repeat CBC in AM   DVT prophylaxis: Enoxaparin 40 mg sq q24h Code Status: FULL CODE Family Communication: No family present at bedside  Disposition Plan: Pending improvement of Respiratory Status and when Cleared by Radiation Therapy and anticipate in the next few days; Will get PT/OT to evaluate and treat however patient was too fatigued today after XRT to participate   Consultants:   Radiation Oncology    Procedures: XRT  Antimicrobials:  Anti-infectives (From admission, onward)   Start     Dose/Rate Route Frequency Ordered Stop   08/19/18 1600  metroNIDAZOLE (FLAGYL) tablet 500 mg     500 mg Oral Every 8 hours 08/19/18 1515     08/17/18 1600  metroNIDAZOLE (FLAGYL) IVPB 500 mg  Status:  Discontinued     500 mg 100 mL/hr over 60 Minutes Intravenous Every 8 hours 08/17/18 1544 08/19/18 1515   08/17/18 1230  metroNIDAZOLE (FLAGYL) IVPB 500 mg  Status:  Discontinued     500 mg 100 mL/hr over 60 Minutes Intravenous Every 8 hours 08/17/18 1157 08/17/18 1544   08/17/18 0800  vancomycin (VANCOCIN) 500 mg in sodium chloride 0.9 % 100 mL IVPB  Status:  Discontinued     500 mg 100 mL/hr over 60 Minutes Intravenous Every 12 hours 08/16/18 2201 08/17/18 1157   08/16/18 2200  ceFEPIme (MAXIPIME) 1 g in sodium chloride 0.9 % 100 mL IVPB     1 g 200 mL/hr over 30 Minutes Intravenous Every 8 hours 08/16/18 1848 08/24/18 2359   08/16/18 2000  vancomycin (VANCOCIN) IVPB 1000 mg/200 mL premix     1,000 mg 200 mL/hr over 60 Minutes Intravenous  Once 08/16/18 1927 08/16/18 2114     Subjective: Seen and examined at  bedside and states that she is feeling better and having a more productive cough.  WBC is trending down and she thinks radiation therapy is significantly helping.  No chest pain and cough more productive.  Denies any lightheadedness or dizziness.  States that she had a better night.  Objective: Vitals:   08/22/18 0545 08/22/18 0622 08/22/18 0813 08/22/18 1430  BP: (!) 93/51 109/67    Pulse: 98 96    Resp: 18     Temp: (!) 97.5 F (36.4 C) 97.7 F (36.5 C)    TempSrc: Oral Oral    SpO2: 96%  99% 100%  Weight:        Intake/Output Summary (Last 24 hours) at 08/22/2018 1539 Last data filed at 08/22/2018 3329 Gross per 24 hour  Intake 744.79 ml  Output 1250 ml  Net -505.21 ml   Filed Weights   08/16/18 1838  Weight: 55 kg   Examination: Physical Exam:  Constitutional: Well-nourished, well-developed African-American female is currently in no acute distress and appears calm and more comfortable today Eyes: Sclera anicteric.  Lids and conjunctive are normal ENMT: External ears and nose appear normal.  Grossly normal hearing Neck: Appears supple with no JVD Respiratory: Diminished to  auscultation bilaterally worse on the right compared to left with minimal wheezing today.  Still has some rhonchi on the right.  Respiratory rate is not increased today and is not using accessory muscle breathe.  Cough is slightly more productive today Cardiovascular: Regular rate and rhythm.  No appreciable murmurs, rubs or gallops.  No upper extremity edema noted. Abdomen: Soft, nontender, nondistended.  Bowel sounds present all 4 quadrants GU: Deferred Musculoskeletal: Bilateral AKA's Skin: Skin is warm and dry no appreciable rashes or lesions limited skin evaluation. Neurologic: Cranial nerves II through XII grossly intact no appreciable focal deficits Psychiatric: Normal mood and affect.  Intact judgment and insight.  Patient is still slightly anxious but improved from yesterday.  She is awake and  alert and oriented x3  Data Reviewed: I have personally reviewed following labs and imaging studies  CBC: Recent Labs  Lab 08/18/18 0329 08/19/18 0559 08/20/18 0547 08/21/18 0542 08/22/18 0535  WBC 31.7* 21.9* 25.6* 23.4* 20.9*  NEUTROABS  --  19.4* 23.1 21.5* 20.1*  HGB 8.7* 8.5* 8.8* 8.8* 8.6*  HCT 26.5* 26.0* 26.4* 26.2* 26.4*  MCV 87.7 87.5 86.6 86.2 86.8  PLT 252 291 342 368 161   Basic Metabolic Panel: Recent Labs  Lab 08/18/18 0329 08/19/18 0559 08/20/18 0547 08/21/18 0542 08/22/18 0535  NA 137 136 137 138 134*  K 3.5 2.7* 3.0* 3.1* 4.0  CL 108 104 104 105 103  CO2 22 21* 23 24 24   GLUCOSE 164* 165* 122* 134* 288*  BUN 5* 7* 6* 7* 10  CREATININE 0.58 0.57 0.59 0.49 0.49  CALCIUM 7.4* 7.5* 7.5* 7.8* 7.7*  MG 1.3* 1.7 1.6* 2.0 1.7  PHOS  --   --   --  2.4* 2.8   GFR: CrCl cannot be calculated (Unknown ideal weight.). Liver Function Tests: Recent Labs  Lab 08/17/18 0336 08/21/18 0542 08/22/18 0535  AST 16 8* 11*  ALT 20 9 9   ALKPHOS 123 99 115  BILITOT 0.7 0.7 0.4  PROT 5.2* 5.7* 5.4*  ALBUMIN 1.4* 1.7* 1.6*   No results for input(s): LIPASE, AMYLASE in the last 168 hours. No results for input(s): AMMONIA in the last 168 hours. Coagulation Profile: No results for input(s): INR, PROTIME in the last 168 hours. Cardiac Enzymes: Recent Labs  Lab 08/17/18 0336  TROPONINI 0.03*   BNP (last 3 results) No results for input(s): PROBNP in the last 8760 hours. HbA1C: No results for input(s): HGBA1C in the last 72 hours. CBG: No results for input(s): GLUCAP in the last 168 hours. Lipid Profile: No results for input(s): CHOL, HDL, LDLCALC, TRIG, CHOLHDL, LDLDIRECT in the last 72 hours. Thyroid Function Tests: No results for input(s): TSH, T4TOTAL, FREET4, T3FREE, THYROIDAB in the last 72 hours. Anemia Panel: No results for input(s): VITAMINB12, FOLATE, FERRITIN, TIBC, IRON, RETICCTPCT in the last 72 hours. Sepsis Labs: Recent Labs  Lab  08/18/18 0329 08/19/18 0559  PROCALCITON 1.42 1.08   Recent Results (from the past 240 hour(s))  MRSA PCR Screening     Status: None   Collection Time: 08/16/18  9:01 PM  Result Value Ref Range Status   MRSA by PCR NEGATIVE NEGATIVE Final    Comment:        The GeneXpert MRSA Assay (FDA approved for NASAL specimens only), is one component of a comprehensive MRSA colonization surveillance program. It is not intended to diagnose MRSA infection nor to guide or monitor treatment for MRSA infections. Performed at [facility], [place]  [address], [place], [place] [address]   Culture, blood (Routine X 2) w Reflex to ID Panel     Status: None   Collection Time: 08/17/18 11:48 AM  Result Value Ref Range Status   Specimen Description BLOOD RIGHT ARM  Final   Special Requests   Final    BOTTLES DRAWN AEROBIC ONLY Blood Culture results may not be optimal due to an inadequate volume of blood received in culture bottles   Culture   Final    NO GROWTH 5 DAYS Performed at [facility], [place] [address], [place], [place] [address]    Report Status 08/22/2018 FINAL  Final  Culture, blood (Routine X 2) w Reflex to ID Panel     Status: None   Collection Time: 08/17/18 11:56 AM  Result Value Ref Range Status   Specimen Description BLOOD RIGHT HAND  Final   Special Requests   Final    BOTTLES DRAWN AEROBIC ONLY Blood Culture results may not be optimal due to an inadequate volume of blood received in culture bottles   Culture   Final    NO GROWTH 5 DAYS Performed at [facility], [place] [address] [place], [place] [address]    Report Status 08/22/2018 FINAL  Final    Radiology Studies: Dg Chest Port 1 View  Result Date: 08/22/2018 CLINICAL DATA:  Shortness of breath EXAM: PORTABLE CHEST 1 VIEW COMPARISON:  08/21/2018 FINDINGS: Cardiac shadow is stable. Changes consistent with the known history of central right lower lobe mass lesion with consolidation and effusion are again seen.  Large right paratracheal adenopathy is again identified and stable. The left lung remains clear. No new focal abnormality is noted. IMPRESSION: Stable changes of right hilar mass with peripheral consolidation and mediastinal adenopathy. No new focal abnormality is seen. Electronically Signed   By: Inez Catalina M.D.   On: 08/22/2018 06:58   Dg Chest Port 1 View  Result Date: 08/21/2018 CLINICAL DATA:  Shortness of breath EXAM: PORTABLE CHEST 1 VIEW COMPARISON:  Previous CT from 08/16/2018 FINDINGS: Cardiac shadow is mildly enlarged. The aorta again demonstrates atherosclerotic calcifications. Large right paratracheal mass lesion is noted shown as metastatic adenopathy on recent CT examination. Consolidation in the right lower lobe is again noted consistent with the known mass lesion stable from previous CT. The left lung is clear. No bony abnormality is noted. IMPRESSION: Persistent changes on the right similar to that seen on prior CT examination. Electronically Signed   By: Inez Catalina M.D.   On: 08/21/2018 10:53   Scheduled Meds: . arformoterol  15 mcg Nebulization Q12H  . budesonide  0.5 mg Nebulization BID  . enoxaparin (LOVENOX) injection  40 mg Subcutaneous Q24H  . famotidine  20 mg Oral QHS  . feeding supplement (ENSURE ENLIVE)  237 mL Oral BID BM  . guaiFENesin  1,200 mg Oral BID  . levalbuterol  0.63 mg Nebulization TID  . methylPREDNISolone (SOLU-MEDROL) injection  40 mg Intravenous Q8H  . metroNIDAZOLE  500 mg Oral Q8H  . montelukast  10 mg Oral QHS  . pantoprazole  40 mg Oral QAC breakfast   Continuous Infusions: . sodium chloride 250 mL (08/18/18 1711)  . ceFEPime (MAXIPIME) IV 1 g (08/22/18 1525)    LOS: 6 days   Kerney Elbe, DO [facility] PAGER is on [place]  If 7PM-7AM, please contact night-coverage [email or web address] Password [facility] 08/22/2018, 3:39 PM

## 2018-08-22 NOTE — Progress Notes (Signed)
OT Cancellation Note  Patient Details Name: Gail Moore MRN: [id number] DOB: 1951-05-26   Cancelled Treatment:    Reason Eval/Treat Not Completed: Other (comment). Pt is getting ready for xrt. She states she is too fatiqued to work after. Will try to check back Monday  [facility] 08/22/2018, 9:37 AM  Lesle Chris, OTR/L [phone] 08/22/2018

## 2018-08-22 NOTE — Progress Notes (Signed)
PT Cancellation Note  Patient Details Name: Gail Moore MRN: [id number] DOB: 1951/04/21   Cancelled Treatment:    Reason Eval/Treat Not Completed: Fatigue/lethargy limiting ability to participate -pt with radiation today, states she is too tired for PT. Will follow up as schedule permits.   Julien Girt, PT, DPT  Pager # [phone]      Azaan Leask D Brailyn Delman 08/22/2018, 6:09 PM

## 2018-08-22 NOTE — Progress Notes (Signed)
Nutrition Follow-up  DOCUMENTATION CODES:   Severe malnutrition in context of chronic illness  INTERVENTION:    Continue Ensure Enlive po BID, each supplement provides 350 kcal and 20 grams of protein  Add Magic cup TID with meals, each supplement provides 290 kcal and 9 grams of protein  NUTRITION DIAGNOSIS:   Moderate Malnutrition related to chronic illness, cancer and cancer related treatments(COPD, non-small cell lung cancer) as evidenced by mild fat depletion, moderate fat depletion, mild muscle depletion, moderate muscle depletion.  Ongoing  GOAL:   Patient will meet greater than or equal to 90% of their needs  Progressing  MONITOR:   PO intake, Supplement acceptance, I & O's, Weight trends, Labs  REASON FOR ASSESSMENT:   Malnutrition Screening Tool    ASSESSMENT:   67 year old female with PMH significant for non-small cell lung cancer, COPD, and PVD s/p bilateral AKA. Pt admitted for acute hypoxic respiratory failure secondary to post-obstructive pneumonia, HCAP, and severe hypokalemia.   Pt excited today that she has her voice back and can swallow s/p radiation. States her appetite was progressing slowly prior to this morning where she consumed 50% of her grits and bacon. Recent meal completions charted as 30-100% for her last 8 meals. Pt has been compliant with Ensure BID but would like to be provided with more supplementation until her appetite is back to baseline. RD to order [employer].   A recent wt has not been obtained since last RD visit.   Plan to d/c once cleared by radiation therapy.   Medications reviewed and include: solumedrol Labs reviewed: Na 134 (L) CBG [phone]   Diet Order:   Diet Order            Diet Heart Room service appropriate? Yes; Fluid consistency: Thin  Diet effective now              EDUCATION NEEDS:   No education needs have been identified at this time  Skin:  Skin Assessment: Reviewed RN Assessment  Last BM:   08/22/18  Height:   Ht Readings from Last 1 Encounters:  No data found for Ht    Weight:   Wt Readings from Last 1 Encounters:  08/16/18 55 kg    Ideal Body Weight:     BMI:  There is no height or weight on file to calculate BMI.  Estimated Nutritional Needs:   Kcal:  1500-1700  Protein:  70-85 grams  Fluid:  >/= 1.5 L  Mariana Single RD, LDN Clinical Nutrition Pager # [phone]

## 2018-08-23 LAB — CBC WITH DIFFERENTIAL/PLATELET
BASOS ABS: 0 10*3/uL (ref 0.0–0.1)
Basophils Relative: 0 %
Eosinophils Absolute: 0 10*3/uL (ref 0.0–0.7)
Eosinophils Relative: 0 %
HEMATOCRIT: 27.7 % — AB (ref 36.0–46.0)
HEMOGLOBIN: 9 g/dL — AB (ref 12.0–15.0)
LYMPHS ABS: 0.4 10*3/uL — AB (ref 0.7–4.0)
LYMPHS PCT: 2 %
MCH: 28.7 pg (ref 26.0–34.0)
MCHC: 32.5 g/dL (ref 30.0–36.0)
MCV: 88.2 fL (ref 78.0–100.0)
Monocytes Absolute: 0.5 10*3/uL (ref 0.1–1.0)
Monocytes Relative: 3 %
NEUTROS ABS: 18.3 10*3/uL — AB (ref 1.7–7.7)
Neutrophils Relative %: 95 %
Platelets: 379 10*3/uL (ref 150–400)
RBC: 3.14 MIL/uL — ABNORMAL LOW (ref 3.87–5.11)
RDW: 16.2 % — AB (ref 11.5–15.5)
WBC: 19.2 10*3/uL — ABNORMAL HIGH (ref 4.0–10.5)

## 2018-08-23 LAB — COMPREHENSIVE METABOLIC PANEL
ALBUMIN: 1.8 g/dL — AB (ref 3.5–5.0)
ALK PHOS: 152 U/L — AB (ref 38–126)
ALT: 9 U/L (ref 0–44)
AST: 17 U/L (ref 15–41)
Anion gap: 11 (ref 5–15)
BILIRUBIN TOTAL: 0.3 mg/dL (ref 0.3–1.2)
BUN: 12 mg/dL (ref 8–23)
CO2: 22 mmol/L (ref 22–32)
Calcium: 7.9 mg/dL — ABNORMAL LOW (ref 8.9–10.3)
Chloride: 102 mmol/L (ref 98–111)
Creatinine, Ser: 0.49 mg/dL (ref 0.44–1.00)
GFR calc Af Amer: >60 mL/min (ref >60)
Glucose, Bld: 312 mg/dL — ABNORMAL HIGH (ref 70–99)
POTASSIUM: 4.2 mmol/L (ref 3.5–5.1)
Sodium: 135 mmol/L (ref 135–145)
TOTAL PROTEIN: 5.4 g/dL — AB (ref 6.5–8.1)

## 2018-08-23 LAB — GLUCOSE, CAPILLARY
GLUCOSE-CAPILLARY: 281 mg/dL — AB (ref 70–99)
Glucose-Capillary: 278 mg/dL — ABNORMAL HIGH (ref 70–99)
Glucose-Capillary: 339 mg/dL — ABNORMAL HIGH (ref 70–99)

## 2018-08-23 LAB — MAGNESIUM: MAGNESIUM: 1.9 mg/dL (ref 1.7–2.4)

## 2018-08-23 LAB — PHOSPHORUS: Phosphorus: 2.4 mg/dL — ABNORMAL LOW (ref 2.5–4.6)

## 2018-08-23 MED ORDER — K PHOS MONO-SOD PHOS DI & MONO 155-852-130 MG PO TABS
500.0000 mg | ORAL_TABLET | Freq: Two times a day (BID) | ORAL | Status: DC
  Administered 2018-08-23: 500 mg via ORAL
  Filled 2018-08-23 (×2): qty 2

## 2018-08-23 MED ORDER — HYDROCODONE-HOMATROPINE 5-1.5 MG/5ML PO SYRP: 5.0000 mL | ORAL_SOLUTION | Freq: Four times a day (QID) | ORAL | 0 refills | Status: DC | PRN

## 2018-08-23 MED ORDER — PREDNISONE 10 MG (21) PO TBPK: ORAL_TABLET | ORAL | 0 refills | Status: DC

## 2018-08-23 MED ORDER — PREDNISONE 10 MG PO TABS: ORAL_TABLET | ORAL | 0 refills | Status: DC

## 2018-08-23 MED ORDER — AMOXICILLIN-POT CLAVULANATE 875-125 MG PO TABS: 1.0000 | ORAL_TABLET | Freq: Two times a day (BID) | ORAL | 0 refills | Status: AC

## 2018-08-23 MED ORDER — GUAIFENESIN ER 600 MG PO TB12: 600.0000 mg | ORAL_TABLET | Freq: Two times a day (BID) | ORAL | 0 refills | Status: DC

## 2018-08-23 MED ORDER — INSULIN ASPART 100 UNIT/ML ~~LOC~~ SOLN
0.0000 [IU] | Freq: Three times a day (TID) | SUBCUTANEOUS | Status: DC
  Administered 2018-08-23: 11 [IU] via SUBCUTANEOUS

## 2018-08-23 MED ORDER — ENSURE ENLIVE PO LIQD: 237.0000 mL | Freq: Two times a day (BID) | ORAL | 12 refills | Status: DC

## 2018-08-23 MED ORDER — INSULIN ASPART 100 UNIT/ML ~~LOC~~ SOLN: 0.0000 [IU] | Freq: Every day | SUBCUTANEOUS | Status: DC

## 2018-08-23 MED ORDER — AMOXICILLIN-POT CLAVULANATE 875-125 MG PO TABS: 1.0000 | ORAL_TABLET | Freq: Two times a day (BID) | ORAL | Status: DC

## 2018-08-23 MED ORDER — LIP MEDEX EX OINT: TOPICAL_OINTMENT | CUTANEOUS | 0 refills | Status: AC | PRN

## 2018-08-23 NOTE — Progress Notes (Signed)
Discharge instructions and medications discussed with patient.  Prescriptions given to patient.  AVS given to patient.  All questions answered.

## 2018-08-23 NOTE — Discharge Summary (Signed)
Physician Discharge Summary  Zaydee Aina ZOX:[id number] DOB: 1951/12/09 DOA: 08/16/2018  PCP: Marco Collie, MD  Admit date: 08/16/2018 Discharge date: 08/23/2018  Admitted From: Home Disposition: Home with [place] and resumption  Recommendations for Outpatient Follow-up:  1. Follow up with PCP in 1-2 weeks 2. Follow up with Dr. Hinton Rao of Oncology in 1 week 3. Follow up Pulmonary Dr. Melvyn Novas as an outpatient 4. Follow up Dr. Dwana Curd in Radiation Oncology for continuation of XRT to complete last 10 Treatments  5. Please obtain CMP/CBC, Mag, Phos in one week 6. Repeat CXR in 3-6 weeks  7. 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 Found to be hypoxemic with respiratory failure due to postobstructive pneumonia. Patient was started on empiric antibiotic and oncology was consulted. Given persistent symptoms was advised to start radiation and she was transferred to [facility] for radiation oncology evaluation. She undewent XRT 4/14 treatments and improved. She was transitioned off of IV antibiotics to p.o. antibiotics and her IV Solu-Medrol that she was taking was transitioned to a p.o. prednisone taper.  PT OT evaluated and recommended home health PT.  Because patient had significantly improved from admission she was deemed medically stable to be discharged home and she will need follow-up with primary care as well as pulmonology as well as radiation oncology and medical oncology in the outpatient setting.  Case was discussed with Dr. Lisbeth Renshaw of radiation oncology  who states he will arrange follow-up the patient to come back Tuesday for continued radiation therapy.  Discharge Diagnoses:  Principal Problem:   Acute respiratory failure with hypoxia (HCC) Active Problems:   HCAP (healthcare-associated pneumonia)   Lung cancer ([place])   Hypokalemia   Tachycardia   Sepsis ([place])   Protein-calorie malnutrition, severe  Acute Respiratory failure with hypoxia secondary to Postobstructive pneumonia, Improved -Patient was currently on IV Cefepime and po Flagyl and will transition to po Augmentin -Blood cultures negative so far.  -Patient started radiotherapy treatment and had 4th Treatment yesterday and will resume Treatments Tuesday  -Continue supportive care with Home bronchodilators; Had Brovana and Pulmicort while hospitalized and added scheduled Xopenex, and increased Mucinex to 1200 mg po BID and incentive spirometry along with Flutter Valve. Will cut Mucinex dose back down to 600 mg po BID  -Checked Strep Pneumoniae; Legionella Pneuophila Ag and pending; Strep Pneumo Urine Ag Negative  -Wean oxygen as able tokeep saturations above 89%. -Now on Room Air -C/w Home Nebs -Added Hycodan 5 mL q6hprn Cough and will D/C with it  -Had Rhonchi and Wheezing and so stopped po Prednisone and started IV Solumedrol 40 mg q8h and will wean to 40 mg q12h and now will D/C home on Prednisone Taper  -Procalcitonin is trending down and went from 1.42 -> 1.08 -WBCs and also trending down from 31.7 is now 19.2 -Continue to monitor and repeat CBC in a.m.  Sepsis secondary to Postobstructive pneumonia -Sepsis physiology has resolved; Continue Empiric antibiotic therapy as above  -Repeat CXR yesterday AM showed."Stable changes of right hilar mass with peripheral consolidation and mediastinal adenopathy. No new focal abnormality is seen." -See above -PT and  OT evaluations recommending Home Health PT/OT  -Follow up with Pulmonary and repeat CXR in 3-6 weeks    Hypokalemia -Patient's K+ was 3.1 and improved to 4.2 -Continue to Monitor and Replete as Necessary -Repeat CMP In AM   Hypomagnesemia  -Patient's Mag Level was 1.6 and is now improved to 1.9 -Continue to Monitor and Replete as Necessary -Repeat Mag Level in AM   Hypophosphatemia -Patient's phosphorus level was 2.4 -Replete prior to D/C -Continue monitor and replete as necessary -Repeat phosphorus level in the a.m.  COPD -On chronic steroid with Prednisone but will stop currently and start on IV Methylprednisolone 40 mg q8h and cut down to 40 mg q12h -> Sterapred at D/C and then resume Home Prednisone  -Continue Bronchodilators as above  -C/w Supplemental O2 via [place] as Necessary but has not required O2 in a few days   Non-small cell cancer of the lung -Recently diagnosed, by Dr. Hinton Rao at [facility].  -Given symptoms and signs of postobstructive pneumonia Case was discussed with radiation oncology who recommended start radiation and is getting Radiation Treatment 4/14 treatment yesterday. -Follow up with Dr. Hinton Rao as an outpatient  -Follow up with Dr. Dwana Curd in Radiation Oncology Tuesday  Peripheral artery disease status post bilateralAKA -Not on any ASA or Statin  -Follow up with PCP   Severe malnutrition and context of chronic illness -Continue nutrition supplements with Ensure Enlive 237 mL po BID at D/C  -Nutritionist Consulted and appreciate furtehr recommendations and evaluation  Hyponatremia, improved  -Was Mild at 134 and down from 138 but now Na+ is 135 -In the setting of IV Lasix Use -Continue to Monitor and Trend  -Repeat CMP as an outpatient   Normocytic Anemia -Patient's Hb/Hct appears stable and is now 9.0/27.7 -Continue to Monitor for S/Sx of Bleeding -Repeat CBC as an outpatient   Discharge Instructions  Discharge Instructions    Call MD for:  difficulty breathing, headache or visual disturbances   Complete by:  As  directed    Call MD for:  extreme fatigue   Complete by:  As directed    Call MD for:  hives   Complete by:  As directed    Call MD for:  persistant dizziness or light-headedness   Complete by:  As directed    Call MD for:  persistant nausea and vomiting   Complete by:  As directed    Call MD for:  redness, tenderness, or signs of infection (pain, swelling, redness, odor or green/yellow discharge around incision site)   Complete by:  As directed    Call MD for:  severe uncontrolled pain   Complete by:  As directed    Call MD for:  temperature >100.4   Complete by:  As directed    Diet - low sodium heart healthy   Complete by:  As directed    Discharge instructions   Complete by:  As directed    You were cared for by a hospitalist during your hospital stay. If you have any questions about your discharge medications or the care you received while you were in the hospital after you are discharged, you can call the unit and ask to speak with the hospitalist on call if the hospitalist that took care of you is not available. Once you are discharged, your primary care physician will handle any further medical issues. Please note that NO REFILLS for any discharge medications will be authorized once you are discharged, as it is imperative that  you return to your primary care physician (or establish a relationship with a primary care physician if you do not have one) for your aftercare needs so that they can reassess your need for medications and monitor your lab values.  Follow up with PCP, medical oncology, radiation oncology, and pulmonology as an outpatient.  She will call you to follow-up on Tuesday for continuation of radiation treatments.  Take all medications as prescribed. If symptoms change or worsen please return to the ED for evaluation   Increase activity slowly   Complete by:  As directed      Allergies as of 08/23/2018   No Known Allergies     Medication List    STOP taking these  medications   predniSONE 10 MG tablet Commonly known as:  DELTASONE Replaced by:  predniSONE 10 MG (21) Tbpk tablet You also have another medication with the same name that you need to continue taking as instructed.     TAKE these medications   albuterol (2.5 MG/3ML) 0.083% nebulizer solution Commonly known as:  PROVENTIL Take 2.5 mg by nebulization every 6 (six) hours as needed for wheezing or shortness of breath.   amoxicillin-clavulanate 875-125 MG tablet Commonly known as:  AUGMENTIN Take 1 tablet by mouth every 12 (twelve) hours for 6 days.   budesonide 0.5 MG/2ML nebulizer solution Commonly known as:  PULMICORT Take 0.5 mg by nebulization 2 (two) times daily.   famotidine 20 MG tablet Commonly known as:  PEPCID One at bedtime What changed:    how much to take  how to take this  when to take this  additional instructions   feeding supplement (ENSURE ENLIVE) Liqd Take 237 mLs by mouth 2 (two) times daily between meals. Start taking on:  08/24/2018   formoterol 20 MCG/2ML nebulizer solution Commonly known as:  PERFOROMIST Take 20 mcg by nebulization 2 (two) times daily.   guaiFENesin 600 MG 12 hr tablet Commonly known as:  MUCINEX Take 1 tablet (600 mg total) by mouth 2 (two) times daily.   HYDROcodone-homatropine 5-1.5 MG/5ML syrup Commonly known as:  HYCODAN Take 5 mLs by mouth every 6 (six) hours as needed for cough.   lip balm ointment Apply topically as needed for lip care.   montelukast 10 MG tablet Commonly known as:  SINGULAIR Take 10 mg by mouth at bedtime.   pantoprazole 40 MG tablet Commonly known as:  PROTONIX Take 1 tablet (40 mg total) by mouth daily. Take 30-60 min before first meal of the day   predniSONE 10 MG tablet Commonly known as:  DELTASONE Take 10mg  daily after Sterapred Taper What changed:    additional instructions  Another medication with the same name was removed. Continue taking this medication, and follow the directions  you see here.   predniSONE 10 MG (21) Tbpk tablet Commonly known as:  STERAPRED UNI-PAK 21 TAB Take 6 tablets day 1, 5 tablets day 2, 4 tablets day 3, 3 tablets day 4, 2 tablets day 5, 1 tablet day 6, and then continue 10 mg tablets daily with home prednisone dosing What changed:  You were already taking a medication with the same name, and this prescription was added. Make sure you understand how and when to take each. Replaces:  predniSONE 10 MG tablet      Follow-up Information    Marco Collie, MD Follow up.   Specialty:  Family Medicine Contact information: [place]  [address] [phone]  Gery Pray, MD. Call.   Specialty:  Radiation Oncology Why:  Follow up Tuesday for continued Radiation Treatment  Contact information: [place] [address] [phone]        Tanda Rockers, MD. Call.   Specialty:  Pulmonary Disease Why:  Follow up within 1 week Contact information: [address] [place] [address] [phone]        Derwood Kaplan, MD. Call.   Specialty:  Oncology Why:  Follow up within 1 week Contact information: [place]. [place] [address] [phone]          No Known Allergies  Consultations:  Radiation Oncology  Procedures/Studies: Dg Chest Port 1 View  Result Date: 08/22/2018 CLINICAL DATA:  Shortness of breath EXAM: PORTABLE CHEST 1 VIEW COMPARISON:  08/21/2018 FINDINGS: Cardiac shadow is stable. Changes consistent with the known history of central right lower lobe mass lesion with consolidation and effusion are again seen. Large right paratracheal adenopathy is again identified and stable. The left lung remains clear. No new focal abnormality is noted. IMPRESSION: Stable changes of right hilar mass with peripheral consolidation and mediastinal adenopathy. No new focal abnormality is seen. Electronically Signed   By: Inez Catalina M.D.   On: 08/22/2018  06:58   Dg Chest Port 1 View  Result Date: 08/21/2018 CLINICAL DATA:  Shortness of breath EXAM: PORTABLE CHEST 1 VIEW COMPARISON:  Previous CT from 08/16/2018 FINDINGS: Cardiac shadow is mildly enlarged. The aorta again demonstrates atherosclerotic calcifications. Large right paratracheal mass lesion is noted shown as metastatic adenopathy on recent CT examination. Consolidation in the right lower lobe is again noted consistent with the known mass lesion stable from previous CT. The left lung is clear. No bony abnormality is noted. IMPRESSION: Persistent changes on the right similar to that seen on prior CT examination. Electronically Signed   By: Inez Catalina M.D.   On: 08/21/2018 10:53    Subjective: And examined at bedside and is breathing much better.  States her cough is better as well.  No chest pain, lightheadedness or dizziness.  Denies any chest pressure and states that she is feeling much better.  Excited about going home.  And has no other complaints or concerns at this time.  Discharge Exam: Vitals:   08/23/18 0908 08/23/18 1403  BP:  139/74  Pulse:  (!) 101  Resp:  20  Temp:  98 F (36.7 C)  SpO2: 94% 98%   Vitals:   08/23/18 0456 08/23/18 0908 08/23/18 1000 08/23/18 1403  BP: 126/73   139/74  Pulse: 98   (!) 101  Resp: 16   20  Temp: 98 F (36.7 C)   98 F (36.7 C)  TempSrc: Oral   Oral  SpO2: 100% 94%  98%  Weight:      Height:   4\' 4"  (1.321 m)    General: Pt is alert, awake, not in acute distress Cardiovascular: RRR, S1/S2 +, no rubs, no gallops Respiratory: Diminished bilaterally, no wheezing, Rhonchi heard in RLL Abdominal: Soft, NT, ND, bowel sounds + Extremities: no edema in the Upper extermities, no cyanosis MSK: Bilateral AKA's  The results of significant diagnostics from this hospitalization (including imaging, microbiology, ancillary and laboratory) are listed below for reference.    Microbiology: Recent Results (from the past 240 hour(s))  MRSA PCR  Screening     Status: None   Collection Time: 08/16/18  9:01 PM  Result Value Ref Range Status   MRSA  by PCR NEGATIVE NEGATIVE Final    Comment:        The GeneXpert MRSA Assay (FDA approved for NASAL specimens only), is one component of a comprehensive MRSA colonization surveillance program. It is not intended to diagnose MRSA infection nor to guide or monitor treatment for MRSA infections. Performed at [facility], [place] [address] [place], [place] [address]   Culture, blood (Routine X 2) w Reflex to ID Panel     Status: None   Collection Time: 08/17/18 11:48 AM  Result Value Ref Range Status   Specimen Description BLOOD RIGHT ARM  Final   Special Requests   Final    BOTTLES DRAWN AEROBIC ONLY Blood Culture results may not be optimal due to an inadequate volume of blood received in culture bottles   Culture   Final    NO GROWTH 5 DAYS Performed at [facility], [place] [address], [place], [place] [address]    Report Status 08/22/2018 FINAL  Final  Culture, blood (Routine X 2) w Reflex to ID Panel     Status: None   Collection Time: 08/17/18 11:56 AM  Result Value Ref Range Status   Specimen Description BLOOD RIGHT HAND  Final   Special Requests   Final    BOTTLES DRAWN AEROBIC ONLY Blood Culture results may not be optimal due to an inadequate volume of blood received in culture bottles   Culture   Final    NO GROWTH 5 DAYS Performed at [facility], [place] [address] [place], [place] [address]    Report Status 08/22/2018 FINAL  Final    Labs: BNP (last 3 results) No results for input(s): BNP in the last 8760 hours. Basic Metabolic Panel: Recent Labs  Lab 08/19/18 0559 08/20/18 0547 08/21/18 0542 08/22/18 0535 08/23/18 0452  NA 136 137 138 134* 135  K 2.7* 3.0* 3.1* 4.0 4.2  CL 104 104 105 103 102  CO2 21* 23 24 24 22   GLUCOSE 165* 122* 134* 288* 312*  BUN 7* 6* 7* 10 12  CREATININE 0.57 0.59 0.49 0.49 0.49  CALCIUM 7.5* 7.5* 7.8* 7.7* 7.9*  MG  1.7 1.6* 2.0 1.7 1.9  PHOS  --   --  2.4* 2.8 2.4*   Liver Function Tests: Recent Labs  Lab 08/17/18 0336 08/21/18 0542 08/22/18 0535 08/23/18 0452  AST 16 8* 11* 17  ALT 20 9 9 9   ALKPHOS 123 99 115 152*  BILITOT 0.7 0.7 0.4 0.3  PROT 5.2* 5.7* 5.4* 5.4*  ALBUMIN 1.4* 1.7* 1.6* 1.8*   No results for input(s): LIPASE, AMYLASE in the last 168 hours. No results for input(s): AMMONIA in the last 168 hours. CBC: Recent Labs  Lab 08/19/18 0559 08/20/18 0547 08/21/18 0542 08/22/18 0535 08/23/18 0452  WBC 21.9* 25.6* 23.4* 20.9* 19.2*  NEUTROABS 19.4* 23.1 21.5* 20.1* 18.3*  HGB 8.5* 8.8* 8.8* 8.6* 9.0*  HCT 26.0* 26.4* 26.2* 26.4* 27.7*  MCV 87.5 86.6 86.2 86.8 88.2  PLT 291 342 368 344 379   Cardiac Enzymes: Recent Labs  Lab 08/17/18 0336  TROPONINI 0.03*   BNP: Invalid input(s): POCBNP CBG: Recent Labs  Lab 08/23/18 0907 08/23/18 1206  GLUCAP 278* 339*   D-Dimer No results for input(s): DDIMER in the last 72 hours. Hgb A1c No results for input(s): HGBA1C in the last 72 hours. Lipid Profile No results for input(s): CHOL, HDL, LDLCALC, TRIG, CHOLHDL, LDLDIRECT in the last 72 hours. Thyroid function studies No  results for input(s): TSH, T4TOTAL, T3FREE, THYROIDAB in the last 72 hours.  Invalid input(s): FREET3 Anemia work up No results for input(s): VITAMINB12, FOLATE, FERRITIN, TIBC, IRON, RETICCTPCT in the last 72 hours. Urinalysis No results found for: COLORURINE, APPEARANCEUR, [place], [place], [place], [place], [place], [place], PROTEINUR, UROBILINOGEN, NITRITE, LEUKOCYTESUR Sepsis Labs Invalid input(s): PROCALCITONIN,  WBC,  LACTICIDVEN Microbiology Recent Results (from the past 240 hour(s))  MRSA PCR Screening     Status: None   Collection Time: 08/16/18  9:01 PM  Result Value Ref Range Status   MRSA by PCR NEGATIVE NEGATIVE Final    Comment:        The GeneXpert MRSA Assay (FDA approved for NASAL specimens only), is one component of  a comprehensive MRSA colonization surveillance program. It is not intended to diagnose MRSA infection nor to guide or monitor treatment for MRSA infections. Performed at [facility], [place] [address] [place], [place] [address]   Culture, blood (Routine X 2) w Reflex to ID Panel     Status: None   Collection Time: 08/17/18 11:48 AM  Result Value Ref Range Status   Specimen Description BLOOD RIGHT ARM  Final   Special Requests   Final    BOTTLES DRAWN AEROBIC ONLY Blood Culture results may not be optimal due to an inadequate volume of blood received in culture bottles   Culture   Final    NO GROWTH 5 DAYS Performed at [facility], [place] [address] [place], [place] [address]    Report Status 08/22/2018 FINAL  Final  Culture, blood (Routine X 2) w Reflex to ID Panel     Status: None   Collection Time: 08/17/18 11:56 AM  Result Value Ref Range Status   Specimen Description BLOOD RIGHT HAND  Final   Special Requests   Final    BOTTLES DRAWN AEROBIC ONLY Blood Culture results may not be optimal due to an inadequate volume of blood received in culture bottles   Culture   Final    NO GROWTH 5 DAYS Performed at [facility], [place] [address] [place], [place] [address]    Report Status 08/22/2018 FINAL  Final   Time coordinating discharge: 35 minutes  SIGNED:  Kerney Elbe, DO [facility] 08/23/2018, 4:11 PM Pager is on [place]  If 7PM-7AM, please contact night-coverage [email or web address] Password [email or web address]

## 2018-08-23 NOTE — Progress Notes (Signed)
Occupational Therapy Treatment Patient Details Name: Bobetta Korf MRN: [id number] DOB: [birth year] Today's Date: 08/23/2018    History of present illness 67 y.o. 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 Found to have acute hypoxemic resp failure due to post-obstructive PNA, started on empiric Abx and admitted to the hospitalist service. To be transferred to [facility] for radiation on 08/18/18   OT comments  Pt get well getting from bed to chair ( anterior / posterior transfer)  Follow Up Recommendations  Home health OT;Supervision/Assistance - 24 hour    Equipment Recommendations  None recommended by OT    Recommendations for Other Services      Precautions / Restrictions Precautions Precautions: Fall       Mobility Bed Mobility Overal bed mobility: Needs Assistance Bed Mobility: Rolling Rolling: Modified independent (Device/Increase time)            Transfers Overall transfer level: Needs assistance   Transfers: Comptroller transfers: Supervision        Balance Overall balance assessment: Mild deficits observed, not formally tested                                         ADL either performed or assessed with clinical judgement   ADL Overall ADL's : Needs assistance/impaired     Grooming: Set up;Sitting   Upper Body Bathing: Set up;Sitting   Lower Body Bathing: Set up;Sitting/lateral leans   Upper Body Dressing : Set up;Sitting   Lower Body Dressing: Sitting/lateral leans;Set up                 General ADL Comments: pt performed transfer to chair scooting backwards into chair from bed.  Pt did well. Educated NT on technique for transfer     Vision Patient Visual Report: No change from baseline            Cognition Arousal/Alertness: Awake/alert Behavior During Therapy:  WFL for tasks assessed/performed Overall Cognitive Status: Within Functional Limits for tasks assessed                                                     Pertinent Vitals/ Pain       Pain Assessment: No/denies pain     Prior Functioning/Environment              Frequency  Min 2X/week        Progress Toward Goals  OT Goals(current goals can now be found in the care plan section)  Progress towards OT goals: Progressing toward goals     Plan Discharge plan remains appropriate       AM-PAC PT "6 Clicks" Daily Activity     Outcome Measure   Help from another person eating meals?: None Help from another person taking care of personal grooming?: None Help from another person toileting, which includes using toliet, bedpan, or urinal?: A Little Help from another person bathing (including washing, rinsing, drying)?: A Little Help from another person to put on and taking off regular upper body clothing?: None Help from another person to put on and taking  off regular lower body clothing?: A Little 6 Click Score: 21    End of Session    OT Visit Diagnosis: Other abnormalities of gait and mobility (R26.89);Muscle weakness (generalized) (M62.81)   Activity Tolerance Patient tolerated treatment well;Patient limited by fatigue   Patient Left in bed;with call bell/phone within reach;with bed alarm set;with nursing/sitter in room   Nurse Communication Mobility status(pt had watery bowel movement)        Time: 9767-3419 OT Time Calculation (min): 19 min  Charges: OT General Charges $OT Visit: 1 Visit OT Treatments $Self Care/Home Management : 8-22 mins  [place], [place]   Betsy Pries 08/23/2018, 3:00 PM

## 2018-08-23 NOTE — Evaluation (Signed)
Physical Therapy Evaluation Patient Details Name: Gail Moore MRN: [id number] DOB: 05/09/1951 Today's Date: 08/23/2018   History of Present Illness  67 y.o. 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 Found to have acute hypoxemic resp failure due to post-obstructive PNA, started on empiric Abx and admitted to the hospitalist service. To be transferred to [facility] for radiation on 08/18/18  Clinical Impression   Re-evaluation performed today per MD/RN request. Patient able to perform all functional bed mobility with S and extended time, also able to scoot laterally, forward, and backwards with B UEs with S and extended time. She reports no concerns with mobility and is confident in her return home. At this point from PT perspective see no reason that patient should not be able to return home with HHPT and S moving forward.     Follow Up Recommendations Home health PT;Supervision/Assistance - 24 hour    Equipment Recommendations  None recommended by PT    Recommendations for Other Services       Precautions / Restrictions Precautions Precautions: Fall Precaution Comments: Bil AKA Restrictions Weight Bearing Restrictions: No      Mobility  Bed Mobility Overal bed mobility: Needs Assistance Bed Mobility: Rolling Rolling: Modified independent (Device/Increase time)   Supine to sit: Supervision Sit to supine: Supervision   General bed mobility comments: S and increased time  Transfers Overall transfer level: Needs assistance   Transfers: Lateral/Scoot Transfers;Anterior-Posterior Wellsite geologist transfers: Supervision  Lateral/Scoot Transfers: Supervision General transfer comment: able to scoot laterally, forwards, backwards on compliant surface with extended time but no remarkable difficulty   Ambulation/Gait             General Gait  Details: NA- B AKA   Stairs            Wheelchair Mobility    Modified Rankin (Stroke Patients Only)       Balance Overall balance assessment: Mild deficits observed, not formally tested Sitting-balance support: Bilateral upper extremity supported Sitting balance-Leahy Scale: Good                                       Pertinent Vitals/Pain Pain Assessment: No/denies pain    Home Living Family/patient expects to be discharged to:: Private residence Living Arrangements: Alone Available Help at Discharge: Family;Available 24 hours/day Type of Home: Mobile home Home Access: Ramped entrance     Home Layout: One level Home Equipment: Wheelchair - manual;Hand held shower head;Tub bench;Other (comment)(transfer board ) Additional Comments: uses sliding board transfers     Prior Function Level of Independence: Independent         Comments: independent with mobility, and iADLs     Hand Dominance   Dominant Hand: Right    Extremity/Trunk Assessment   Upper Extremity Assessment Upper Extremity Assessment: Overall WFL for tasks assessed    Lower Extremity Assessment Lower Extremity Assessment: RLE deficits/detail;LLE deficits/detail RLE Deficits / Details: R AKA, LLE Deficits / Details: L AKA       Communication   Communication: No difficulties  Cognition Arousal/Alertness: Awake/alert Behavior During Therapy: WFL for tasks assessed/performed Overall Cognitive Status: Within Functional Limits for tasks assessed  General Comments      Exercises     Assessment/Plan    PT Assessment Patient needs continued PT services  PT Problem List Decreased strength;Decreased activity tolerance;Decreased mobility       PT Treatment Interventions DME instruction;Functional mobility training;Therapeutic activities;Therapeutic exercise;Balance training;Cognitive remediation;Wheelchair mobility  training;Patient/family education    PT Goals (Current goals can be found in the Care Plan section)  Acute Rehab PT Goals Patient Stated Goal: get to feeling better and go home PT Goal Formulation: With patient Time For Goal Achievement: 09/01/18 Potential to Achieve Goals: Fair    Frequency Min 3X/week   Barriers to discharge        Co-evaluation               AM-PAC PT "6 Clicks" Daily Activity  Outcome Measure Difficulty turning over in bed (including adjusting bedclothes, sheets and blankets)?: None Difficulty moving from lying on back to sitting on the side of the bed? : None Difficulty sitting down on and standing up from a chair with arms (e.g., wheelchair, bedside commode, etc,.)?: Unable Help needed moving to and from a bed to chair (including a wheelchair)?: A Little Help needed walking in hospital room?: Total Help needed climbing 3-5 steps with a railing? : Total 6 Click Score: 14    End of Session   Activity Tolerance: Patient tolerated treatment well Patient left: in bed;with call bell/phone within reach Nurse Communication: Mobility status PT Visit Diagnosis: Other abnormalities of gait and mobility (R26.89);Muscle weakness (generalized) (M62.81);Difficulty in walking, not elsewhere classified (R26.2)    Time: 1620-1630 PT Time Calculation (min) (ACUTE ONLY): 10 min   Charges:   PT Evaluation $PT Re-evaluation: 1 Re-eval          Deniece Ree PT, DPT, CBIS  Supplemental Physical Therapist [place]   Pager [phone]

## 2018-08-23 NOTE — Care Management Note (Signed)
Case Management Note  Patient Details  Name: Gail Moore MRN: [id number] Date of Birth: 1951/10/15  Subjective/Objective:  Acute respiratory failure with hypoxia s/t postobstructive pneumonia, Sepsis                    Action/Plan: NCM spoke to pt and dtr, Lelah at bedside. Pt lives at home alone but family lives next door to assist with care. States she is able to get into her wheelchair and go to the bathroom. States she tries to stay independent at home. Offered choice for HH/list provided. Pt agreeable to [facility] for HH. She has wheelchair, shower chair, and bedside commode at home.   Expected Discharge Date:  08/23/18               Expected Discharge Plan:  [place]  In-House Referral:  NA  Discharge planning Services  CM Consult  Post Acute Care Choice:  Home Health Choice offered to:  Adult Children  DME Arranged:  N/A DME Agency:  NA  HH Arranged:  PT, Nurse's Aide, OT HH Agency:  [facility]  Status of Service:  Completed, signed off  If discussed at [place] of Stay Meetings, dates discussed:    Additional Comments:  Erenest Rasher, RN 08/23/2018, 4:53 PM

## 2018-08-24 DIAGNOSIS — C3490 Malignant neoplasm of unspecified part of unspecified bronchus or lung: Secondary | ICD-10-CM | POA: Diagnosis not present

## 2018-08-24 LAB — LEGIONELLA PNEUMOPHILA SEROGP 1 UR AG: L. pneumophila Serogp 1 Ur Ag: NEGATIVE

## 2018-08-26 ENCOUNTER — Telehealth: Payer: Self-pay | Admitting: *Deleted

## 2018-08-26 ENCOUNTER — Ambulatory Visit
Admission: RE | Admit: 2018-08-26 | Discharge: 2018-08-26 | Disposition: A | Discharge status: Home / Self Care | Payer: Medicare Other | Source: Ambulatory Visit | Attending: Radiation Oncology | Admitting: Radiation Oncology

## 2018-08-26 ENCOUNTER — Other Ambulatory Visit: Payer: Self-pay | Admitting: Radiation Oncology

## 2018-08-26 ENCOUNTER — Telehealth: Payer: Self-pay

## 2018-08-26 DIAGNOSIS — J189 Pneumonia, unspecified organism: Secondary | ICD-10-CM | POA: Diagnosis not present

## 2018-08-26 DIAGNOSIS — Z89512 Acquired absence of left leg below knee: Secondary | ICD-10-CM | POA: Diagnosis not present

## 2018-08-26 DIAGNOSIS — C3490 Malignant neoplasm of unspecified part of unspecified bronchus or lung: Secondary | ICD-10-CM | POA: Diagnosis not present

## 2018-08-26 DIAGNOSIS — Z89511 Acquired absence of right leg below knee: Secondary | ICD-10-CM | POA: Diagnosis not present

## 2018-08-26 DIAGNOSIS — Z87891 Personal history of nicotine dependence: Secondary | ICD-10-CM | POA: Diagnosis not present

## 2018-08-26 DIAGNOSIS — E43 Unspecified severe protein-calorie malnutrition: Secondary | ICD-10-CM | POA: Diagnosis not present

## 2018-08-26 DIAGNOSIS — I739 Peripheral vascular disease, unspecified: Secondary | ICD-10-CM | POA: Diagnosis not present

## 2018-08-26 DIAGNOSIS — Z510 Encounter for antineoplastic radiation therapy: Secondary | ICD-10-CM | POA: Diagnosis not present

## 2018-08-26 DIAGNOSIS — J440 Chronic obstructive pulmonary disease with acute lower respiratory infection: Secondary | ICD-10-CM | POA: Diagnosis not present

## 2018-08-26 DIAGNOSIS — C3431 Malignant neoplasm of lower lobe, right bronchus or lung: Secondary | ICD-10-CM | POA: Insufficient documentation

## 2018-08-26 NOTE — Telephone Encounter (Signed)
Contacted pt's son to convey that pt is to continue treatments as an outpatient. Pt's son states that he wants treatment transferred to [facility]. Conveyed to son that this RN was unsure of process to transfer treatments to another radiation oncology center. Conveyed to son that this RN would speak to Dr. Sondra Come. Son verbalized understanding and agreement. Son states that he is a Estate agent" and may have to leave his mother "here for hours before I can come get her". Son states that he needs his mother to have the "very first" appointment of the day. Encouraged son to speak with therapists on machine. Son verbalized understanding and agreement.

## 2018-08-26 NOTE — Telephone Encounter (Signed)
XXXX 

## 2018-08-26 NOTE — Telephone Encounter (Signed)
PATIENT TO HAVE APPT. WITH DR. PALERMO ON 08-28-18- ARRIVAL TIME - 8:45 AM, DR. KINARD TO NOTIFY PATIENT

## 2018-08-27 ENCOUNTER — Encounter (HOSPITAL_COMMUNITY): Payer: Self-pay | Admitting: Internal Medicine

## 2018-08-27 ENCOUNTER — Ambulatory Visit
Admission: RE | Admit: 2018-08-27 | Discharge: 2018-08-27 | Disposition: A | Discharge status: Home / Self Care | Payer: Medicare Other | Source: Ambulatory Visit | Attending: Radiation Oncology | Admitting: Radiation Oncology

## 2018-08-27 DIAGNOSIS — C3431 Malignant neoplasm of lower lobe, right bronchus or lung: Secondary | ICD-10-CM | POA: Diagnosis not present

## 2018-08-28 ENCOUNTER — Ambulatory Visit: Payer: Medicare Other

## 2018-08-29 ENCOUNTER — Ambulatory Visit
Admission: RE | Admit: 2018-08-29 | Discharge: 2018-08-29 | Disposition: A | Discharge status: Home / Self Care | Payer: Medicare Other | Source: Ambulatory Visit | Attending: Radiation Oncology | Admitting: Radiation Oncology

## 2018-08-29 ENCOUNTER — Other Ambulatory Visit: Payer: Self-pay | Admitting: Radiation Oncology

## 2018-08-29 DIAGNOSIS — C3431 Malignant neoplasm of lower lobe, right bronchus or lung: Secondary | ICD-10-CM | POA: Diagnosis not present

## 2018-08-29 MED ORDER — HYDROCODONE-HOMATROPINE 5-1.5 MG/5ML PO SYRP: 5.0000 mL | ORAL_SOLUTION | Freq: Four times a day (QID) | ORAL | 0 refills | Status: DC | PRN

## 2018-08-30 ENCOUNTER — Telehealth: Payer: Self-pay | Admitting: *Deleted

## 2018-08-30 ENCOUNTER — Other Ambulatory Visit: Payer: Self-pay

## 2018-08-30 ENCOUNTER — Inpatient Hospital Stay (HOSPITAL_COMMUNITY)
Admission: EM | Admit: 2018-08-30 | Discharge: 2018-09-03 | DRG: 871 | Disposition: A | Discharge status: Home / Self Care | Payer: Medicare Other | Attending: Internal Medicine | Admitting: Internal Medicine

## 2018-08-30 ENCOUNTER — Encounter (HOSPITAL_COMMUNITY): Payer: Self-pay | Admitting: Emergency Medicine

## 2018-08-30 ENCOUNTER — Emergency Department (HOSPITAL_COMMUNITY): Payer: Medicare Other

## 2018-08-30 DIAGNOSIS — E440 Moderate protein-calorie malnutrition: Secondary | ICD-10-CM

## 2018-08-30 DIAGNOSIS — Z66 Do not resuscitate: Secondary | ICD-10-CM | POA: Diagnosis present

## 2018-08-30 DIAGNOSIS — J9601 Acute respiratory failure with hypoxia: Secondary | ICD-10-CM | POA: Diagnosis present

## 2018-08-30 DIAGNOSIS — Y95 Nosocomial condition: Secondary | ICD-10-CM | POA: Diagnosis present

## 2018-08-30 DIAGNOSIS — R739 Hyperglycemia, unspecified: Secondary | ICD-10-CM | POA: Diagnosis present

## 2018-08-30 DIAGNOSIS — I110 Hypertensive heart disease with heart failure: Secondary | ICD-10-CM | POA: Diagnosis present

## 2018-08-30 DIAGNOSIS — R000 Tachycardia, unspecified: Secondary | ICD-10-CM | POA: Diagnosis not present

## 2018-08-30 DIAGNOSIS — Z7952 Long term (current) use of systemic steroids: Secondary | ICD-10-CM | POA: Diagnosis not present

## 2018-08-30 DIAGNOSIS — E876 Hypokalemia: Secondary | ICD-10-CM | POA: Diagnosis present

## 2018-08-30 DIAGNOSIS — J90 Pleural effusion, not elsewhere classified: Secondary | ICD-10-CM | POA: Diagnosis not present

## 2018-08-30 DIAGNOSIS — C3491 Malignant neoplasm of unspecified part of right bronchus or lung: Secondary | ICD-10-CM | POA: Diagnosis present

## 2018-08-30 DIAGNOSIS — D630 Anemia in neoplastic disease: Secondary | ICD-10-CM | POA: Diagnosis present

## 2018-08-30 DIAGNOSIS — I509 Heart failure, unspecified: Secondary | ICD-10-CM | POA: Diagnosis not present

## 2018-08-30 DIAGNOSIS — E669 Obesity, unspecified: Secondary | ICD-10-CM | POA: Diagnosis present

## 2018-08-30 DIAGNOSIS — Z923 Personal history of irradiation: Secondary | ICD-10-CM

## 2018-08-30 DIAGNOSIS — Z803 Family history of malignant neoplasm of breast: Secondary | ICD-10-CM

## 2018-08-30 DIAGNOSIS — C3431 Malignant neoplasm of lower lobe, right bronchus or lung: Secondary | ICD-10-CM | POA: Diagnosis not present

## 2018-08-30 DIAGNOSIS — Z79899 Other long term (current) drug therapy: Secondary | ICD-10-CM

## 2018-08-30 DIAGNOSIS — C3490 Malignant neoplasm of unspecified part of unspecified bronchus or lung: Secondary | ICD-10-CM | POA: Diagnosis present

## 2018-08-30 DIAGNOSIS — Z89611 Acquired absence of right leg above knee: Secondary | ICD-10-CM | POA: Diagnosis not present

## 2018-08-30 DIAGNOSIS — J440 Chronic obstructive pulmonary disease with acute lower respiratory infection: Secondary | ICD-10-CM | POA: Diagnosis present

## 2018-08-30 DIAGNOSIS — Z89612 Acquired absence of left leg above knee: Secondary | ICD-10-CM

## 2018-08-30 DIAGNOSIS — A419 Sepsis, unspecified organism: Principal | ICD-10-CM | POA: Diagnosis present

## 2018-08-30 DIAGNOSIS — D649 Anemia, unspecified: Secondary | ICD-10-CM | POA: Diagnosis not present

## 2018-08-30 DIAGNOSIS — Z8042 Family history of malignant neoplasm of prostate: Secondary | ICD-10-CM | POA: Diagnosis not present

## 2018-08-30 DIAGNOSIS — J9811 Atelectasis: Secondary | ICD-10-CM | POA: Diagnosis not present

## 2018-08-30 DIAGNOSIS — R109 Unspecified abdominal pain: Secondary | ICD-10-CM | POA: Diagnosis not present

## 2018-08-30 DIAGNOSIS — Z6831 Body mass index (BMI) 31.0-31.9, adult: Secondary | ICD-10-CM | POA: Diagnosis not present

## 2018-08-30 DIAGNOSIS — T380X5A Adverse effect of glucocorticoids and synthetic analogues, initial encounter: Secondary | ICD-10-CM | POA: Diagnosis present

## 2018-08-30 DIAGNOSIS — Z7951 Long term (current) use of inhaled steroids: Secondary | ICD-10-CM

## 2018-08-30 DIAGNOSIS — E43 Unspecified severe protein-calorie malnutrition: Secondary | ICD-10-CM | POA: Diagnosis present

## 2018-08-30 DIAGNOSIS — R918 Other nonspecific abnormal finding of lung field: Secondary | ICD-10-CM | POA: Diagnosis not present

## 2018-08-30 DIAGNOSIS — L89152 Pressure ulcer of sacral region, stage 2: Secondary | ICD-10-CM | POA: Diagnosis present

## 2018-08-30 DIAGNOSIS — I471 Supraventricular tachycardia: Secondary | ICD-10-CM

## 2018-08-30 DIAGNOSIS — Z993 Dependence on wheelchair: Secondary | ICD-10-CM | POA: Diagnosis not present

## 2018-08-30 DIAGNOSIS — Z87891 Personal history of nicotine dependence: Secondary | ICD-10-CM

## 2018-08-30 DIAGNOSIS — R042 Hemoptysis: Secondary | ICD-10-CM | POA: Diagnosis not present

## 2018-08-30 DIAGNOSIS — Z79891 Long term (current) use of opiate analgesic: Secondary | ICD-10-CM | POA: Diagnosis not present

## 2018-08-30 DIAGNOSIS — J189 Pneumonia, unspecified organism: Secondary | ICD-10-CM | POA: Diagnosis not present

## 2018-08-30 DIAGNOSIS — X58XXXA Exposure to other specified factors, initial encounter: Secondary | ICD-10-CM | POA: Diagnosis present

## 2018-08-30 DIAGNOSIS — L8990 Pressure ulcer of unspecified site, unspecified stage: Secondary | ICD-10-CM

## 2018-08-30 DIAGNOSIS — I5030 Unspecified diastolic (congestive) heart failure: Secondary | ICD-10-CM | POA: Diagnosis not present

## 2018-08-30 LAB — CBC
HCT: 28.1 % — ABNORMAL LOW (ref 36.0–46.0)
HEMOGLOBIN: 9.2 g/dL — AB (ref 12.0–15.0)
MCH: 29.1 pg (ref 26.0–34.0)
MCHC: 32.7 g/dL (ref 30.0–36.0)
MCV: 88.9 fL (ref 78.0–100.0)
Platelets: 150 10*3/uL (ref 150–400)
RBC: 3.16 MIL/uL — ABNORMAL LOW (ref 3.87–5.11)
RDW: 18.4 % — AB (ref 11.5–15.5)
WBC: 17.6 10*3/uL — ABNORMAL HIGH (ref 4.0–10.5)

## 2018-08-30 LAB — HEPATIC FUNCTION PANEL
ALBUMIN: 1.9 g/dL — AB (ref 3.5–5.0)
ALT: 23 U/L (ref 0–44)
AST: 27 U/L (ref 15–41)
Alkaline Phosphatase: 132 U/L — ABNORMAL HIGH (ref 38–126)
BILIRUBIN DIRECT: 0.4 mg/dL — AB (ref 0.0–0.2)
BILIRUBIN INDIRECT: 0.7 mg/dL (ref 0.3–0.9)
Total Bilirubin: 1.1 mg/dL (ref 0.3–1.2)
Total Protein: 5.5 g/dL — ABNORMAL LOW (ref 6.5–8.1)

## 2018-08-30 LAB — CG4 I-STAT (LACTIC ACID): Lactic Acid, Venous: 1.1 mmol/L (ref 0.5–1.9)

## 2018-08-30 LAB — MAGNESIUM: MAGNESIUM: 1.4 mg/dL — AB (ref 1.7–2.4)

## 2018-08-30 LAB — BASIC METABOLIC PANEL
ANION GAP: 12 (ref 5–15)
BUN: 6 mg/dL — ABNORMAL LOW (ref 8–23)
CALCIUM: 7.7 mg/dL — AB (ref 8.9–10.3)
CHLORIDE: 101 mmol/L (ref 98–111)
CO2: 26 mmol/L (ref 22–32)
CREATININE: 0.63 mg/dL (ref 0.44–1.00)
GFR calc non Af Amer: >60 mL/min (ref >60)
GLUCOSE: 270 mg/dL — AB (ref 70–99)
Potassium: 2.8 mmol/L — ABNORMAL LOW (ref 3.5–5.1)
Sodium: 139 mmol/L (ref 135–145)

## 2018-08-30 LAB — PROCALCITONIN: Procalcitonin: 0.51 ng/mL

## 2018-08-30 LAB — URINALYSIS, ROUTINE W REFLEX MICROSCOPIC
GLUCOSE, UA: NEGATIVE mg/dL
Hgb urine dipstick: NEGATIVE
Ketones, ur: 5 mg/dL — AB
Leukocytes, UA: NEGATIVE
NITRITE: NEGATIVE
PH: 5 (ref 5.0–8.0)
Protein, ur: 100 mg/dL — AB
Specific Gravity, Urine: 1.032 — ABNORMAL HIGH (ref 1.005–1.030)

## 2018-08-30 LAB — PHOSPHORUS: Phosphorus: 3.2 mg/dL (ref 2.5–4.6)

## 2018-08-30 LAB — CBG MONITORING, ED: Glucose-Capillary: 260 mg/dL — ABNORMAL HIGH (ref 70–99)

## 2018-08-30 LAB — GLUCOSE, CAPILLARY: GLUCOSE-CAPILLARY: 179 mg/dL — AB (ref 70–99)

## 2018-08-30 MED ORDER — MONTELUKAST SODIUM 10 MG PO TABS
10.0000 mg | ORAL_TABLET | Freq: Every day | ORAL | Status: DC
  Administered 2018-08-30 – 2018-09-02 (×4): 10 mg via ORAL
  Filled 2018-08-30 (×4): qty 1

## 2018-08-30 MED ORDER — SODIUM CHLORIDE 0.9 % IV SOLN
INTRAVENOUS | Status: DC | PRN
  Administered 2018-08-30 – 2018-08-31 (×2): 250 mL via INTRAVENOUS

## 2018-08-30 MED ORDER — POTASSIUM CHLORIDE CRYS ER 10 MEQ PO TBCR
10.0000 meq | EXTENDED_RELEASE_TABLET | Freq: Two times a day (BID) | ORAL | Status: DC
  Administered 2018-08-30 – 2018-09-03 (×8): 10 meq via ORAL
  Filled 2018-08-30 (×8): qty 1

## 2018-08-30 MED ORDER — PREDNISONE 20 MG PO TABS
40.0000 mg | ORAL_TABLET | Freq: Every day | ORAL | Status: DC
  Administered 2018-08-31: 40 mg via ORAL
  Filled 2018-08-30: qty 2

## 2018-08-30 MED ORDER — POTASSIUM CHLORIDE 10 MEQ/100ML IV SOLN: 10.0000 meq | INTRAVENOUS | Status: AC

## 2018-08-30 MED ORDER — VANCOMYCIN HCL 10 G IV SOLR
1250.0000 mg | Freq: Once | INTRAVENOUS | Status: AC
  Administered 2018-08-30: 1250 mg via INTRAVENOUS
  Filled 2018-08-30: qty 1250

## 2018-08-30 MED ORDER — ONDANSETRON HCL 4 MG PO TABS: 4.0000 mg | ORAL_TABLET | Freq: Four times a day (QID) | ORAL | Status: DC | PRN

## 2018-08-30 MED ORDER — HYDROCODONE-HOMATROPINE 5-1.5 MG/5ML PO SYRP
5.0000 mL | ORAL_SOLUTION | Freq: Four times a day (QID) | ORAL | Status: DC | PRN
  Administered 2018-08-30 – 2018-08-31 (×2): 5 mL via ORAL
  Filled 2018-08-30 (×3): qty 5

## 2018-08-30 MED ORDER — SODIUM CHLORIDE 0.9 % IV SOLN
INTRAVENOUS | Status: AC
  Administered 2018-08-30: 21:00:00 via INTRAVENOUS

## 2018-08-30 MED ORDER — SODIUM CHLORIDE 0.9 % IV SOLN
2.0000 g | Freq: Once | INTRAVENOUS | Status: AC
  Administered 2018-08-30: 2 g via INTRAVENOUS
  Filled 2018-08-30: qty 2

## 2018-08-30 MED ORDER — GUAIFENESIN ER 600 MG PO TB12
1200.0000 mg | ORAL_TABLET | Freq: Two times a day (BID) | ORAL | Status: DC
  Administered 2018-08-30 – 2018-09-03 (×8): 1200 mg via ORAL
  Filled 2018-08-30 (×8): qty 2

## 2018-08-30 MED ORDER — VANCOMYCIN HCL 500 MG IV SOLR
500.0000 mg | INTRAVENOUS | Status: DC
  Filled 2018-08-30: qty 500

## 2018-08-30 MED ORDER — ENOXAPARIN SODIUM 40 MG/0.4ML ~~LOC~~ SOLN
40.0000 mg | SUBCUTANEOUS | Status: DC
  Administered 2018-08-31 – 2018-09-03 (×4): 40 mg via SUBCUTANEOUS
  Filled 2018-08-30 (×4): qty 0.4

## 2018-08-30 MED ORDER — ENSURE ENLIVE PO LIQD
237.0000 mL | Freq: Two times a day (BID) | ORAL | Status: DC
  Administered 2018-08-31: 237 mL via ORAL

## 2018-08-30 MED ORDER — ARFORMOTEROL TARTRATE 15 MCG/2ML IN NEBU
15.0000 ug | INHALATION_SOLUTION | Freq: Two times a day (BID) | RESPIRATORY_TRACT | Status: DC
  Administered 2018-08-30 – 2018-09-03 (×8): 15 ug via RESPIRATORY_TRACT
  Filled 2018-08-30 (×8): qty 2

## 2018-08-30 MED ORDER — POTASSIUM CHLORIDE CRYS ER 20 MEQ PO TBCR
40.0000 meq | EXTENDED_RELEASE_TABLET | Freq: Once | ORAL | Status: AC
  Administered 2018-08-30: 40 meq via ORAL
  Filled 2018-08-30: qty 2

## 2018-08-30 MED ORDER — LEVALBUTEROL HCL 0.63 MG/3ML IN NEBU
0.6300 mg | INHALATION_SOLUTION | RESPIRATORY_TRACT | Status: DC | PRN
  Administered 2018-09-01: 0.63 mg via RESPIRATORY_TRACT
  Filled 2018-08-30: qty 3

## 2018-08-30 MED ORDER — FAMOTIDINE 20 MG PO TABS
20.0000 mg | ORAL_TABLET | Freq: Every day | ORAL | Status: DC
  Administered 2018-08-30 – 2018-09-02 (×4): 20 mg via ORAL
  Filled 2018-08-30 (×4): qty 1

## 2018-08-30 MED ORDER — INSULIN ASPART 100 UNIT/ML ~~LOC~~ SOLN
0.0000 [IU] | Freq: Three times a day (TID) | SUBCUTANEOUS | Status: DC
  Administered 2018-08-31 – 2018-09-01 (×3): 3 [IU] via SUBCUTANEOUS
  Administered 2018-09-01 – 2018-09-02 (×3): 5 [IU] via SUBCUTANEOUS
  Administered 2018-09-03: 2 [IU] via SUBCUTANEOUS

## 2018-08-30 MED ORDER — SODIUM CHLORIDE 0.9 % IV BOLUS
1000.0000 mL | Freq: Once | INTRAVENOUS | Status: AC
  Administered 2018-08-30: 1000 mL via INTRAVENOUS

## 2018-08-30 MED ORDER — ONDANSETRON HCL 4 MG/2ML IJ SOLN: 4.0000 mg | Freq: Four times a day (QID) | INTRAMUSCULAR | Status: DC | PRN

## 2018-08-30 MED ORDER — METRONIDAZOLE IN NACL 5-0.79 MG/ML-% IV SOLN
500.0000 mg | Freq: Three times a day (TID) | INTRAVENOUS | Status: DC
  Administered 2018-08-31 – 2018-09-03 (×10): 500 mg via INTRAVENOUS
  Filled 2018-08-30 (×10): qty 100

## 2018-08-30 MED ORDER — HYDROCODONE-ACETAMINOPHEN 5-325 MG PO TABS
1.0000 | ORAL_TABLET | ORAL | Status: DC | PRN
  Administered 2018-08-31 – 2018-09-01 (×2): 1 via ORAL
  Filled 2018-08-30 (×2): qty 1

## 2018-08-30 MED ORDER — SODIUM CHLORIDE 0.9 % IV SOLN
2.0000 g | INTRAVENOUS | Status: DC
  Administered 2018-08-31 – 2018-09-02 (×3): 2 g via INTRAVENOUS
  Filled 2018-08-30 (×3): qty 2

## 2018-08-30 MED ORDER — IPRATROPIUM BROMIDE 0.02 % IN SOLN
0.5000 mg | Freq: Four times a day (QID) | RESPIRATORY_TRACT | Status: DC
  Administered 2018-08-30 – 2018-08-31 (×2): 0.5 mg via RESPIRATORY_TRACT
  Filled 2018-08-30: qty 2.5

## 2018-08-30 MED ORDER — INSULIN ASPART 100 UNIT/ML ~~LOC~~ SOLN
0.0000 [IU] | Freq: Every day | SUBCUTANEOUS | Status: DC
  Administered 2018-09-01: 2 [IU] via SUBCUTANEOUS

## 2018-08-30 MED ORDER — BUDESONIDE 0.5 MG/2ML IN SUSP
RESPIRATORY_TRACT | Status: AC
  Filled 2018-08-30: qty 2

## 2018-08-30 MED ORDER — IPRATROPIUM BROMIDE 0.02 % IN SOLN
RESPIRATORY_TRACT | Status: AC
  Filled 2018-08-30: qty 2.5

## 2018-08-30 MED ORDER — VANCOMYCIN HCL IN DEXTROSE 1-5 GM/200ML-% IV SOLN: 1000.0000 mg | Freq: Once | INTRAVENOUS | Status: DC

## 2018-08-30 MED ORDER — ACETAMINOPHEN 650 MG RE SUPP: 650.0000 mg | Freq: Four times a day (QID) | RECTAL | Status: DC | PRN

## 2018-08-30 MED ORDER — POTASSIUM CHLORIDE 10 MEQ/100ML IV SOLN
10.0000 meq | INTRAVENOUS | Status: AC
  Administered 2018-08-30 (×2): 10 meq via INTRAVENOUS
  Filled 2018-08-30 (×2): qty 100

## 2018-08-30 MED ORDER — METRONIDAZOLE IN NACL 5-0.79 MG/ML-% IV SOLN
500.0000 mg | Freq: Once | INTRAVENOUS | Status: AC
  Administered 2018-08-30: 500 mg via INTRAVENOUS
  Filled 2018-08-30: qty 100

## 2018-08-30 MED ORDER — ACETAMINOPHEN 325 MG PO TABS: 650.0000 mg | ORAL_TABLET | Freq: Four times a day (QID) | ORAL | Status: DC | PRN

## 2018-08-30 MED ORDER — MAGNESIUM SULFATE 2 GM/50ML IV SOLN
2.0000 g | Freq: Once | INTRAVENOUS | Status: AC
  Administered 2018-08-30: 2 g via INTRAVENOUS
  Filled 2018-08-30: qty 50

## 2018-08-30 MED ORDER — BUDESONIDE 0.5 MG/2ML IN SUSP
0.5000 mg | Freq: Two times a day (BID) | RESPIRATORY_TRACT | Status: DC
  Administered 2018-08-30 – 2018-09-03 (×8): 0.5 mg via RESPIRATORY_TRACT
  Filled 2018-08-30 (×7): qty 2

## 2018-08-30 NOTE — ED Provider Notes (Signed)
[facility] DEPT Provider Note   CSN: [id number] Arrival date & time: 08/30/18  1601     History   Chief Complaint Chief Complaint  Patient presents with  . Fever  . Weakness    HPI Gail Moore is a 67 y.o. female.  67 year old female with past medical history including lung cancer on radiation, COPD, peripheral vascular disease status post bilateral BKA who presents with fevers and shortness of breath.  Family notes that she was discharged from the hospital 1 week ago after she was admitted for pneumonia.  She was sent home on Augmentin which she has been taking.  She initially seemed to be better but now her cough is gotten worse again.  She reports still feeling short of breath.  She endorses sore throat and decreased appetite as well as generalized weakness this morning.  Her son went to give her her medicines this morning and she felt hot, checked her temperature and it was 103.  She had Tylenol and ibuprofen prior to arrival.  No vomiting, urinary symptoms, or sick contacts.  She has had some diarrhea related to the antibiotic use.  The history is provided by the patient and a relative.    Past Medical History:  Diagnosis Date  . Asthma   . Peripheral vascular disease [facility])     Patient Active Problem List   Diagnosis Date Noted  . Protein-calorie malnutrition, severe 08/19/2018  . HCAP (healthcare-associated pneumonia) 08/16/2018  . Lung cancer ([place]) 08/16/2018  . Acute respiratory failure with hypoxia ([place]) 08/16/2018  . Hypokalemia 08/16/2018  . Tachycardia 08/16/2018  . Sepsis ([place]) 08/16/2018  . COPD GOLD 0 07/19/2018  . Lung mass 07/19/2018  . DOE (dyspnea on exertion) 07/18/2018    Past Surgical History:  Procedure Laterality Date  . ABOVE KNEE LEG AMPUTATION Bilateral   . APPENDECTOMY    . COLON SURGERY    . VIDEO BRONCHOSCOPY Bilateral 07/31/2018   Procedure: VIDEO BRONCHOSCOPY WITHOUT FLUORO;  Surgeon: Tanda Rockers, MD;   Location: [facility];  Service: Cardiopulmonary;  Laterality: Bilateral;     OB History   None      Home Medications    Prior to Admission medications   Medication Sig Start Date End Date Taking? Authorizing Provider  albuterol (PROVENTIL) (2.5 MG/3ML) 0.083% nebulizer solution Take 2.5 mg by nebulization every 6 (six) hours as needed for wheezing or shortness of breath.    [provider]  budesonide (PULMICORT) 0.5 MG/2ML nebulizer solution Take 0.5 mg by nebulization 2 (two) times daily.    [provider]  famotidine (PEPCID) 20 MG tablet One at bedtime Patient taking differently: Take 20 mg by mouth at bedtime.  07/18/18   Tanda Rockers, MD  feeding supplement, ENSURE ENLIVE, (ENSURE ENLIVE) LIQD Take 237 mLs by mouth 2 (two) times daily between meals. 08/24/18   Sheikh, Georgina Quint Latif, DO  formoterol (PERFOROMIST) 20 MCG/2ML nebulizer solution Take 20 mcg by nebulization 2 (two) times daily.    [provider]  guaiFENesin (MUCINEX) 600 MG 12 hr tablet Take 1 tablet (600 mg total) by mouth 2 (two) times daily. 08/23/18   Sheikh, Omair Latif, DO  HYDROcodone-homatropine [facility]) 5-1.5 MG/5ML syrup Take 5 mLs by mouth every 6 (six) hours as needed for cough. 08/29/18   Eppie Gibson, MD  lip balm (CARMEX) ointment Apply topically as needed for lip care. 08/23/18   Sheikh, Omair Latif, DO  montelukast (SINGULAIR) 10 MG tablet Take 10 mg by  mouth at bedtime.    [provider]  pantoprazole (PROTONIX) 40 MG tablet Take 1 tablet (40 mg total) by mouth daily. Take 30-60 min before first meal of the day 07/18/18   Tanda Rockers, MD  predniSONE (DELTASONE) 10 MG tablet Take 10mg  daily after Sterapred Taper 08/23/18   Raiford Noble Latif, DO  predniSONE (STERAPRED UNI-PAK 21 TAB) 10 MG (21) TBPK tablet Take 6 tablets day 1, 5 tablets day 2, 4 tablets day 3, 3 tablets day 4, 2 tablets day 5, 1 tablet day 6, and then continue 10 mg tablets daily with home  prednisone dosing 08/23/18   Kerney Elbe, DO    Family History Family History  Problem Relation Age of Onset  . Breast cancer Mother   . Breast cancer Father   . Prostate cancer Father     Social History Social History   Tobacco Use  . Smoking status: Former Smoker    Packs/day: 0.50    Years: 15.00    Pack years: 7.50    Types: Cigarettes    Last attempt to quit: 12/24/2012    Years since quitting: 5.6  . Smokeless tobacco: Never Used  Substance Use Topics  . Alcohol use: Not Currently    Alcohol/week: 4.0 standard drinks    Types: 4 Cans of beer per week    Comment: per week   . Drug use: Not Currently     Allergies   Patient has no known allergies.   Review of Systems Review of Systems All other systems reviewed and are negative except that which was mentioned in HPI   Physical Exam Updated Vital Signs BP 99/65   Pulse (!) 105   Temp 98.5 F (36.9 C) (Oral)   Resp (!) 25   Ht 4\' 4"  (1.321 m)   Wt 55 kg   SpO2 93%   BMI 31.53 kg/m   Physical Exam  Constitutional: She is oriented to person, place, and time. She appears well-developed and well-nourished. No distress.  Ill appearing but non-toxic  HENT:  Head: Normocephalic and atraumatic.  Moist mucous membranes  Eyes: Conjunctivae are normal.  Neck: Neck supple.  Cardiovascular: Normal rate, regular rhythm and normal heart sounds.  No murmur heard. Pulmonary/Chest: Effort normal.  Diminished breath sounds RLL  Abdominal: Soft. Bowel sounds are normal. She exhibits no distension. There is no tenderness.  Musculoskeletal: She exhibits no edema.  B/l BKA  Neurological: She is alert and oriented to person, place, and time.  Fluent speech  Skin: Skin is warm and dry.  Psychiatric: She has a normal mood and affect. Judgment normal.  Nursing note and vitals reviewed.    ED Treatments / Results  Labs (all labs ordered are listed, but only abnormal results are displayed) Labs Reviewed    BASIC METABOLIC PANEL - Abnormal; Notable for the following components:      Result Value   Potassium 2.8 (*)    Glucose, Bld 270 (*)    BUN 6 (*)    Calcium 7.7 (*)    All other components within normal limits  CBC - Abnormal; Notable for the following components:   WBC 17.6 (*)    RBC 3.16 (*)    Hemoglobin 9.2 (*)    HCT 28.1 (*)    RDW 18.4 (*)    All other components within normal limits  HEPATIC FUNCTION PANEL - Abnormal; Notable for the following components:   Total Protein 5.5 (*)    Albumin  1.9 (*)    Alkaline Phosphatase 132 (*)    Bilirubin, Direct 0.4 (*)    All other components within normal limits  CBG MONITORING, ED - Abnormal; Notable for the following components:   Glucose-Capillary 260 (*)    All other components within normal limits  CULTURE, BLOOD (ROUTINE X 2)  CULTURE, BLOOD (ROUTINE X 2)  URINE CULTURE  URINALYSIS, ROUTINE W REFLEX MICROSCOPIC  MAGNESIUM  I-STAT CG4 LACTIC ACID, ED  CG4 I-STAT (LACTIC ACID)    EKG None  Radiology Dg Chest 2 View  Result Date: 08/30/2018 CLINICAL DATA:  Fever and weakness. EXAM: CHEST - 2 VIEW COMPARISON:  08/22/2018 FINDINGS: Stable appearance of right suprahilar soft tissue mass. Geometric opacity in the right lower hemithorax likely corresponds to previously demonstrated right middle and right lower lobe collapse. There is a superimposed right pleural effusion. Osseous structures are without acute abnormality. Soft tissues are grossly normal. IMPRESSION: Stable appearance of large right suprahilar soft tissue mass. Geometric opacity in the right lower hemithorax likely corresponds to previously demonstrated right middle and right lower lobe collapse. There is a superimposed right pleural effusion. Electronically Signed   By: Fidela Salisbury M.D.   On: 08/30/2018 18:09    Procedures .Critical Care Performed by: Sharlett Iles, MD Authorized by: Sharlett Iles, MD   Critical care provider  statement:    Critical care time (minutes):  30   Critical care time was exclusive of:  Separately billable procedures and treating other patients   Critical care was necessary to treat or prevent imminent or life-threatening deterioration of the following conditions:  Sepsis   Critical care was time spent personally by me on the following activities:  Development of treatment plan with patient or surrogate, evaluation of patient's response to treatment, examination of patient, obtaining history from patient or surrogate, ordering and performing treatments and interventions, ordering and review of laboratory studies, ordering and review of radiographic studies, re-evaluation of patient's condition and review of old charts   (including critical care time)  Medications Ordered in ED Medications  sodium chloride 0.9 % bolus 1,000 mL (1,000 mLs Intravenous New Bag/Given 08/30/18 1803)  ceFEPIme (MAXIPIME) 2 g in sodium chloride 0.9 % 100 mL IVPB (2 g Intravenous New Bag/Given 08/30/18 1804)  vancomycin (VANCOCIN) 1,250 mg in sodium chloride 0.9 % 250 mL IVPB (has no administration in time range)  potassium chloride 10 mEq in 100 mL IVPB (has no administration in time range)  potassium chloride SA (K-DUR,KLOR-CON) CR tablet 40 mEq (40 mEq Oral Given 08/30/18 1815)     Initial Impression / Assessment and Plan / ED Course  I have reviewed the triage vital signs and the nursing notes.  Pertinent labs & imaging results that were available during my care of the patient were reviewed by me and considered in my medical decision making (see chart for details).     Mild tachycardia, borderline soft BP but MAP>65 on arrival. O2 sat 93% on RA. Given fevers and recent PNA, called code sepsis w/ blood and urine cultures, Vanc and cefepime for HCAP coverage. No respiratory distress. Lactate normal, K 2.8, calcium 7.7, glucose 260.WBC 17.6 but patient has been on steroids. UA without evidence of infection. Gave IVF  bolus, potassium repletion.   CXR shows persistent mass in R lung, now w/ superimposed R pleural effusion. This effusion could be malignant or infectious.  Symptoms suggest treatment failure, recommended admission for broad-spectrum antibiotics to treat pulmonary infection.  Family in  agreement.  Discussed admission with [facility] hospitalist, Dr. Roel Cluck.  Patient admitted for further care.  Final Clinical Impressions(s) / ED Diagnoses   Final diagnoses:  HCAP (healthcare-associated pneumonia)  Malignant neoplasm of right lung, unspecified part of lung [facility])    ED Discharge Orders    None       Oneita Allmon, Wenda Overland, MD 08/30/18 2324

## 2018-08-30 NOTE — ED Triage Notes (Signed)
Patient here from home with complaints of fever, weakness that started this morning. 100mg  Tylenlol and 200mg  Motrin at 2pm.

## 2018-08-30 NOTE — ED Notes (Signed)
Bed: DZ32 Expected date:  Expected time:  Means of arrival:  Comments: NO Bed / No monitor

## 2018-08-30 NOTE — ED Notes (Signed)
Patient transported to X-ray 

## 2018-08-30 NOTE — H&P (Signed)
Gail Moore WCH:[id number] DOB: 1951/10/01 DOA: 08/30/2018     PCP: Marco Collie, MD   Outpatient Specialists:     Pulmonary    Dr. Dr. Melvyn Novas  Oncology  Dr. Hinton Rao     Dr. Dwana Curd in Radiation Oncology in the future will go to [place] Patient arrived to ER on 08/30/18 at 1601  Patient coming from: home Lives alone,     Chief Complaint:  Chief Complaint  Patient presents with  . Fever  . Weakness    HPI: Gail Moore is a 67 y.o. 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  Has been generally weak gotten worse this morning family treated fever with Tylenol and Motrin currently she is afebrile.  My symptoms including sore throat decreased appetite patient's family endorsing diarrhea in the setting of recent antibiotic use she has been having 1 or 2 bowel movements per day. Denies pleuritic chest pain but does have some pain with cough and has persistent cough somewhat helped by cough syrup  Recently was admitted with similar presentation and discharged on 31 August during her past admission patient was diagnosed with postobstructive pneumonia secondary to lung cancer patient was transferred to [facility] hospitalist time and started radiation for therapy was able to transition off of IV antibiotics patient was evaluated by PT OT and recommended home health PT.  She has known COPD during hospitalization was on Brovana and Pulmicort  scheduled Xopenex, and increased Mucinex to 1200 mg incentive spirometry along with Flutter Valve.  Initially treated with IV steroids transition to prednisone taper at the time of discharge. 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 Hinton Rao at [place] started radiation therapy while hospitalized recently and supposed to follow-up with Dr. Dwana Curd radiation oncology and elected to transfer patient to [facility].  Last treatment was with DR. PALERMO on 9/05.  Plan for patient in the future to start immunotherapy but she have not had a dose yet While in ER:  The following Work up has been ordered so far:  Orders Placed This Encounter  Procedures  . Culture, blood (routine x 2)  . Urine culture  . DG Chest 2 View  . Basic metabolic panel  . CBC  . Urinalysis, Routine w reflex microscopic  . Hepatic function panel  . Magnesium  . Diet NPO time specified  . Cardiac monitoring  . Saline Lock IV, Maintain IV access  . Refer to Sidebar Report for: Sepsis Bundle ED/IP  . Document vital signs within 1-hour of fluid bolus completion and notify provider of bolus completion  . Document Actual / Estimated Weight  . Insert peripheral IV x 2  . Initiate Carrier Fluid Protocol  . Call Code Sepsis ([facility] [phone]) Reason for Consult? tracking  . Consult to hospitalist  . Pulse oximetry, continuous  . CBG monitoring, ED  . I-Stat CG4 Lactic Acid, ED  . CG4 I-STAT (Lactic acid)  . ED EKG      Following Medications were ordered in ER: Medications  vancomycin (VANCOCIN) 1,250 mg in sodium chloride 0.9 % 250 mL IVPB (1,250 mg Intravenous New Bag/Given 08/30/18 1850)  potassium chloride 10 mEq in 100  mL IVPB (has no administration in time range)  metroNIDAZOLE (FLAGYL) IVPB 500 mg (has no administration in time range)  sodium chloride 0.9 % bolus 1,000 mL (1,000 mLs Intravenous New Bag/Given 08/30/18 1803)  ceFEPIme (MAXIPIME) 2 g in sodium chloride 0.9 % 100 mL IVPB (0 g Intravenous Stopped 08/30/18 1850)  potassium chloride SA (K-DUR,KLOR-CON) CR tablet 40 mEq (40 mEq Oral Given 08/30/18 1815)    Significant initial  Findings: Abnormal Labs Reviewed  BASIC METABOLIC PANEL - Abnormal;  Notable for the following components:      Result Value   Potassium 2.8 (*)    Glucose, Bld 270 (*)    BUN 6 (*)    Calcium 7.7 (*)    All other components within normal limits  CBC - Abnormal; Notable for the following components:   WBC 17.6 (*)    RBC 3.16 (*)    Hemoglobin 9.2 (*)    HCT 28.1 (*)    RDW 18.4 (*)    All other components within normal limits  HEPATIC FUNCTION PANEL - Abnormal; Notable for the following components:   Total Protein 5.5 (*)    Albumin 1.9 (*)    Alkaline Phosphatase 132 (*)    Bilirubin, Direct 0.4 (*)    All other components within normal limits  MAGNESIUM - Abnormal; Notable for the following components:   Magnesium 1.4 (*)    All other components within normal limits  CBG MONITORING, ED - Abnormal; Notable for the following components:   Glucose-Capillary 260 (*)    All other components within normal limits     Na 139 K 2.8  Cr     Up from baseline see below Lab Results  Component Value Date   CREATININE 0.63 08/30/2018   CREATININE 0.49 08/23/2018   CREATININE 0.49 08/22/2018        HG/HCT   stable,      Component Value Date/Time   HGB 9.2 (L) 08/30/2018 1653   HCT 28.1 (L) 08/30/2018 1653       Lactic Acid, Venous    Component Value Date/Time   LATICACIDVEN 1.10 08/30/2018 1807      UA not ordered     CXR - right lower hemithorax likely corresponds to previously demonstrated right middle and right lower lobe collapse. There is a superimposed right pleural effusion     ECG:  Personally reviewed by me showing: HR : 110 Rhythm:  Sinus tachycardia   no evidence of ischemic changes QTC 454       ED Triage Vitals  Enc Vitals Group     BP 08/30/18 1642 (!) 99/55     Pulse Rate 08/30/18 1642 87     Resp 08/30/18 1642 19     Temp 08/30/18 1642 98.5 F (36.9 C)     Temp Source 08/30/18 1642 Oral     SpO2 08/30/18 1642 98 %     Weight 08/30/18 1801 121 lb 4.1 oz (55 kg)     Height 08/30/18 1801 4\' 4"   (1.321 m)     Head Circumference --      Peak Flow --      Pain Score 08/30/18 1628 10     Pain Loc --      Pain Edu? --      Excl. in [place]? --   TMAX(24)@       Latest  Blood pressure (!) 108/57, pulse (!) 107, temperature 98.5 F (36.9 C), temperature source Oral, resp. rate 15,  height 4\' 4"  (1.321 m), weight 55 kg, SpO2 94 %.    Hospitalist was called for admission for sepsis secondary to postobstructive pneumonia   Review of Systems:    Pertinent positives include:  Fevers, chills, fatigue,  shortness of breath at rest dyspnea on exertion,  excess mucus,  productive cough, Constitutional:  No weight loss, night sweats,weight loss  HEENT:  No headaches, Difficulty swallowing,Tooth/dental problems,Sore throat,  No sneezing, itching, ear ache, nasal congestion, post nasal drip,  Cardio-vascular:  No chest pain, Orthopnea, PND, anasarca, dizziness, palpitations.no Bilateral lower extremity swelling  GI:  No heartburn, indigestion, abdominal pain, nausea, vomiting, diarrhea, change in bowel habits, loss of appetite, melena, blood in stool, hematemesis Resp:   No non-productive cough, No coughing up of blood.No change in color of mucus.No wheezing. Skin:  no rash or lesions. No jaundice GU:  no dysuria, change in color of urine, no urgency or frequency. No straining to urinate.  No flank pain.  Musculoskeletal:  No joint pain or no joint swelling. No decreased range of motion. No back pain.  Psych:  No change in mood or affect. No depression or anxiety. No memory loss.  Neuro: no localizing neurological complaints, no tingling, no weakness, no double vision, no gait abnormality, no slurred speech, no confusion  All systems reviewed and apart from [place] all are negative  Past Medical History:   Past Medical History:  Diagnosis Date  . Asthma   . Peripheral vascular disease [facility])       Past Surgical History:  Procedure Laterality Date  . ABOVE KNEE LEG AMPUTATION  Bilateral   . APPENDECTOMY    . COLON SURGERY    . VIDEO BRONCHOSCOPY Bilateral 07/31/2018   Procedure: VIDEO BRONCHOSCOPY WITHOUT FLUORO;  Surgeon: Tanda Rockers, MD;  Location: [facility]COPY;  Service: Cardiopulmonary;  Laterality: Bilateral;    Social History:  Ambulatory  wheelchair bound      reports that she quit smoking about 5 years ago. Her smoking use included cigarettes. She has a 7.50 pack-year smoking history. She has never used smokeless tobacco. She reports that she drank about 4.0 standard drinks of alcohol per week. She reports that she has current or past drug history.     Family History:   Family History  Problem Relation Age of Onset  . Breast cancer Mother   . Breast cancer Father   . Prostate cancer Father     Allergies: No Known Allergies   Prior to Admission medications   Medication Sig Start Date End Date Taking? Authorizing Provider  albuterol (PROVENTIL) (2.5 MG/3ML) 0.083% nebulizer solution Take 2.5 mg by nebulization every 6 (six) hours as needed for wheezing or shortness of breath.   Yes [provider]  amoxicillin-clavulanate (AUGMENTIN) 875-125 MG tablet Take 1 tablet by mouth 2 (two) times daily.   Yes [provider]  budesonide (PULMICORT) 0.5 MG/2ML nebulizer solution Take 0.5 mg by nebulization 2 (two) times daily.   Yes [provider]  famotidine (PEPCID) 20 MG tablet One at bedtime Patient taking differently: Take 20 mg by mouth at bedtime.  07/18/18  Yes Tanda Rockers, MD  feeding supplement, ENSURE ENLIVE, (ENSURE ENLIVE) LIQD Take 237 mLs by mouth 2 (two) times daily between meals. 08/24/18  Yes Sheikh, Omair Latif, DO  formoterol (PERFOROMIST) 20 MCG/2ML nebulizer solution Take 20 mcg by nebulization 2 (two) times daily.   Yes [provider]  guaiFENesin (MUCINEX) 600 MG 12 hr tablet Take 1 tablet (  600 mg total) by mouth 2 (two) times daily. 08/23/18  Yes Sheikh, Omair Latif, DO    HYDROcodone-homatropine [facility]) 5-1.5 MG/5ML syrup Take 5 mLs by mouth every 6 (six) hours as needed for cough. 08/29/18  Yes Eppie Gibson, MD  lip balm (CARMEX) ointment Apply topically as needed for lip care. 08/23/18  Yes Sheikh, Omair Latif, DO  montelukast (SINGULAIR) 10 MG tablet Take 10 mg by mouth at bedtime.   Yes [provider]  pantoprazole (PROTONIX) 40 MG tablet Take 1 tablet (40 mg total) by mouth daily. Take 30-60 min before first meal of the day 07/18/18  Yes Tanda Rockers, MD  potassium chloride (K-DUR,KLOR-CON) 10 MEQ tablet Take 10 mEq by mouth 2 (two) times daily. 08/15/18  Yes [provider]  predniSONE (DELTASONE) 10 MG tablet Take 10mg  daily after Sterapred Taper Patient taking differently: Take 10 mg by mouth daily with breakfast. Take 10mg  daily after Sterapred Taper 08/23/18  Yes Sheikh, Omair Latif, DO  predniSONE (STERAPRED UNI-PAK 21 TAB) 10 MG (21) TBPK tablet Take 6 tablets day 1, 5 tablets day 2, 4 tablets day 3, 3 tablets day 4, 2 tablets day 5, 1 tablet day 6, and then continue 10 mg tablets daily with home prednisone dosing Patient not taking: Reported on 08/30/2018 08/23/18   Kerney Elbe, DO   Physical Exam: Blood pressure (!) 108/57, pulse (!) 107, temperature 98.5 F (36.9 C), temperature source Oral, resp. rate 15, height 4\' 4"  (1.321 m), weight 55 kg, SpO2 94 %. 1. General:  in No Acute distress * Chronically ill *well *cachectic *toxic acutely ill -appearing 2. Psychological: Alert and   Oriented 3. Head/ENT:    Dry Mucous Membranes                          Head Non traumatic, neck supple                          Poor Dentition 4. SKIN:  decreased Skin turgor,  Skin clean Dry and intact no rash Right breast with edema but no warmth noted 5. Heart: Regular rate and rhythm no  Murmur, no Rub or gallop 6. Lungs:some  wheezes occasional  crackles  Diminished on the right 7. Abdomen: Soft, non-tender, Non distended  Obese bowel  sounds present 8. Lower extremities: no clubbing, cyanosis Bilateral AKA 9. Neurologically Grossly intact, moving all 4 extremities equally   10. MSK: Normal range of motion   LABS:     Recent Labs  Lab 08/30/18 1653  WBC 17.6*  HGB 9.2*  HCT 28.1*  MCV 88.9  PLT 573   Basic Metabolic Panel: Recent Labs  Lab 08/30/18 1653  NA 139  K 2.8*  CL 101  CO2 26  GLUCOSE 270*  BUN 6*  CREATININE 0.63  CALCIUM 7.7*  MG 1.4*      Recent Labs  Lab 08/30/18 1653  AST 27  ALT 23  ALKPHOS 132*  BILITOT 1.1  PROT 5.5*  ALBUMIN 1.9*   No results for input(s): LIPASE, AMYLASE in the last 168 hours. No results for input(s): AMMONIA in the last 168 hours.    HbA1C: No results for input(s): HGBA1C in the last 72 hours. CBG: Recent Labs  Lab 08/30/18 1651  GLUCAP 260*      Urine analysis: No results found for: COLORURINE, APPEARANCEUR, LABSPEC, [place], GLUCOSEU, [place], BILIRUBINUR, KETONESUR, PROTEINUR, UROBILINOGEN, NITRITE, LEUKOCYTESUR  Cultures:    Component Value Date/Time   SDES BLOOD RIGHT HAND 08/17/2018 1156   SPECREQUEST  08/17/2018 1156    BOTTLES DRAWN AEROBIC ONLY Blood Culture results may not be optimal due to an inadequate volume of blood received in culture bottles   CULT  08/17/2018 1156    NO GROWTH 5 DAYS Performed at [facility], [place] [address] [place], [place] [address]    REPTSTATUS 08/22/2018 FINAL 08/17/2018 1156     Radiological Exams on Admission: Dg Chest 2 View  Result Date: 08/30/2018 CLINICAL DATA:  Fever and weakness. EXAM: CHEST - 2 VIEW COMPARISON:  08/22/2018 FINDINGS: Stable appearance of right suprahilar soft tissue mass. Geometric opacity in the right lower hemithorax likely corresponds to previously demonstrated right middle and right lower lobe collapse. There is a superimposed right pleural effusion. Osseous structures are without acute abnormality. Soft tissues are grossly normal. IMPRESSION: Stable  appearance of large right suprahilar soft tissue mass. Geometric opacity in the right lower hemithorax likely corresponds to previously demonstrated right middle and right lower lobe collapse. There is a superimposed right pleural effusion. Electronically Signed   By: Fidela Salisbury M.D.   On: 08/30/2018 18:09    Chart has been reviewed    Assessment/Plan   67 y.o. 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 HCAP (healthcare-associated pneumonia) persistent postobstructive pneumonia possibly source of febrile illness restart broad-spectrum coverage for now and metronidazole for anaerobic coverage secondary to postobstructive pneumonia . Acute respiratory failure with hypoxia (HCC) likely secondary to postobstructive pneumonia and right-sided lobular lung collapse . Hypokalemia we will replace and monitor on telemetry . Lung cancer [facility]) -discussed with oncology on-call patient's primary oncologist does not round at [facility].  Patient will follow-up as an outpatient if patient is still admitted on Monday  could discuss with primary oncologist  goals of care overall and plan of management given recurrent admissions and worsening functional status . Protein-calorie malnutrition, severe - check prealbumin order nutritional consult hypoalbuminemia likely contributing to edema . Sepsis ([place]) source being possibly postobstructive pneumonia will check UA and check for C. difficile.  For now continue IV antibiotics obtain blood cultures and sputum cultures other source of fever could be malignancy itself . Hypomagnesemia we will replace and monitor on telemetry . Anemia stable continue to monitor . 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 Pleural effusion  -Per review of prior CT suspect large portion of a shadow consistent of collapsed lung although  pleural effusion present in the past did not seem to worsen COPD possibly with acute exacerbation-patient has worsening wheezing when off of steroids but also have noted to have elevated blood sugars.  Suspect partially wheezing could be secondary to obstructive lung mass.  But patient was doing better on higher dose of steroids for now we will increase and see if respiratory status would improve increase prednisone to 40-day and observe continue home inhalers schedule Atrovent and as needed Xopenex order flutter valve continue home inhaled steroids Right breast edema no evidence of cellulitis likely secondary to positioning patient likes to lay down on the right side albumin is low likely contributing to fluid accumulation  Other plan as per orders.  DVT prophylaxis:   Lovenox     Code Status:  DNR/DNI   as per patient  I had personally discussed CODE STATUS with patient and family  Family Communication:   Family  at  Bedside  plan of care was discussed with Son, Daughter in [place],   Disposition Plan:    To home once workup is complete and patient is stable                     Would benefit from PT/OT eval prior to DC  Ordered                                       Nutrition    consulted                                     Consults called:   Please call Dr. Anabel Bene with oncology on Monday if patient is still here to discuss overall goals of care  Admission status:  inpatient     Expect 2 midnight stay secondary to severity of patient's current illness including hemodynamic instability despite optimal treatment (tachycardia hypotension  hypoxia) and extensive comorbidities including  COPD and malignancy I expect  patient to be hospitalized for 2 midnights requiring inpatient medical care. Patient is at high risk for adverse outcome (such as loss of life or disability) if not treated. Indication  for inpatient stay as follows:  Hemodynamic instability despite maximal medical therapy,    Need for IV antibiotics, IV electrolyte replenishment     Level of care     tele             Toy Baker 08/30/2018, 8:03 PM    [facility]  Pager [phone]   after 2 AM please page floor coverage PA If 7AM-7PM, please contact the day team taking care of the patient  [email or web address]  Password [email or web address]

## 2018-08-30 NOTE — Progress Notes (Signed)
Pharmacy Antibiotic Note  Gail Moore is a 67 y.o. female admitted on 08/30/2018 with HCAPwith possible postobstructive PNA.  Pharmacy has been consulted for vancomycin and cefepime dosing.  Plan:  Cefepime 2 gr IV q24h   Vancomycin  1250 mg IV x1, then vancomycin 500 mg IV q24h  Metronidazole 500 mg IV q8h   Monitor clinical course, renal function, cultures as available   Height: 4\' 4"  (132.1 cm) Weight: 121 lb 4.1 oz (55 kg) IBW/kg (Calculated) : 27.1  Temp (24hrs), Avg:98.3 F (36.8 C), Min:98 F (36.7 C), Max:98.5 F (36.9 C)  Recent Labs  Lab 08/30/18 1653 08/30/18 1807  WBC 17.6*  --   CREATININE 0.63  --   LATICACIDVEN  --  1.10    Estimated Creatinine Clearance: 41.3 mL/min (by C-G formula based on SCr of 0.63 mg/dL).    No Known Allergies  Antimicrobials this admission: 9/7 metronidazole >>  9/7 cefepime >>  9/7 vancomycin >>   Dose adjustments this admission:   Microbiology results: 9/7 BCx:  9/7 Legionella antigen :  9/7 Strep pneumo antigen:    Thank you for allowing pharmacy to be a part of this patient's care.   Royetta Asal, PharmD, BCPS Pager [phone] 08/30/2018 9:03 PM

## 2018-08-30 NOTE — ED Notes (Signed)
ED TO INPATIENT HANDOFF REPORT  Name/Age/Gender Gail Moore 67 y.o. female  Code Status Code Status History    Date Active Date Inactive Code Status Order ID Comments User Context   08/16/2018 1848 08/23/2018 2117 Full Code [id number]  Jani Gravel, MD Inpatient      Home/SNF/Other Home  Chief Complaint Fever / Weakness / Shortness of Breath   Level of Care/Admitting Diagnosis ED Disposition    ED Disposition Condition [facility]: [place] [[id number]]  Level of Care: Telemetry [5]  Admit to tele based on following criteria: Other see comments  Comments: tachycardia, hypokalemia  Diagnosis: Sepsis [facility]) [[id number]]  Admitting Physician: Toy Baker [[id number]]  Attending Physician: Toy Baker [[id number]]  Estimated length of stay: 3 - 4 days  Certification:: I certify this patient will need inpatient services for at least 2 midnights  PT Class (Do Not Modify): Inpatient [101]  PT Acc Code (Do Not Modify): Private [1]       Medical History Past Medical History:  Diagnosis Date  . Asthma   . Peripheral vascular disease ([place])     Allergies No Known Allergies  IV Location/Drains/Wounds Patient Lines/Drains/Airways Status   Active Line/Drains/Airways    Name:   Placement date:   Placement time:   Site:   Days:   Peripheral IV 08/30/18 Right Hand   08/30/18    1801    Hand   less than 1   Peripheral IV 08/30/18 Right Forearm   08/30/18    1802    Forearm   less than 1          Labs/Imaging Results for orders placed or performed during the hospital encounter of 08/30/18 (from the past 48 hour(s))  CBG monitoring, ED     Status: Abnormal   Collection Time: 08/30/18  4:51 PM  Result Value Ref Range   Glucose-Capillary 260 (H) 70 - 99 mg/dL  Basic metabolic panel     Status: Abnormal   Collection Time: 08/30/18  4:53 PM  Result Value Ref Range   Sodium 139 135 - 145 mmol/L   Potassium 2.8 (L) 3.5 - 5.1 mmol/L   Chloride 101 98 - 111 mmol/L   CO2 26 22 - 32 mmol/L   Glucose, Bld 270 (H) 70 - 99 mg/dL   BUN 6 (L) 8 - 23 mg/dL   Creatinine, Ser 0.63 0.44 - 1.00 mg/dL   Calcium 7.7 (L) 8.9 - 10.3 mg/dL   GFR calc non Af Amer >60 >60 mL/min   GFR calc Af Amer >60 >60 mL/min    Comment: (NOTE) The eGFR has been calculated using the CKD EPI equation. This calculation has not been validated in all clinical situations. eGFR's persistently <60 mL/min signify possible Chronic Kidney Disease.    Anion gap 12 5 - 15    Comment: Performed at [facility], [place] [address], [place], [place] [address]  CBC     Status: Abnormal   Collection Time: 08/30/18  4:53 PM  Result Value Ref Range   WBC 17.6 (H) 4.0 - 10.5 K/uL   RBC 3.16 (L) 3.87 - 5.11 MIL/uL   Hemoglobin 9.2 (L) 12.0 - 15.0 g/dL   HCT 28.1 (L) 36.0 - 46.0 %   MCV 88.9 78.0 - 100.0 fL   MCH 29.1 26.0 - 34.0 pg   MCHC 32.7 30.0 - 36.0 g/dL   RDW 18.4 (H) 11.5 - 15.5 %   Platelets 150 150 -  400 K/uL    Comment: Performed at [facility], [place] [address], [place], [place] [address]  Hepatic function panel     Status: Abnormal   Collection Time: 08/30/18  4:53 PM  Result Value Ref Range   Total Protein 5.5 (L) 6.5 - 8.1 g/dL   Albumin 1.9 (L) 3.5 - 5.0 g/dL   AST 27 15 - 41 U/L   ALT 23 0 - 44 U/L   Alkaline Phosphatase 132 (H) 38 - 126 U/L   Total Bilirubin 1.1 0.3 - 1.2 mg/dL   Bilirubin, Direct 0.4 (H) 0.0 - 0.2 mg/dL   Indirect Bilirubin 0.7 0.3 - 0.9 mg/dL    Comment: Performed at [facility], [place] [address], [place], [place] [address]  Magnesium     Status: Abnormal   Collection Time: 08/30/18  4:53 PM  Result Value Ref Range   Magnesium 1.4 (L) 1.7 - 2.4 mg/dL    Comment: Performed at [facility], [place] [address] [place], [place] [address]  CG4 I-STAT (Lactic acid)     Status: None   Collection Time: 08/30/18  6:07 PM  Result Value Ref Range   Lactic Acid,  Venous 1.10 0.5 - 1.9 mmol/L   Dg Chest 2 View  Result Date: 08/30/2018 CLINICAL DATA:  Fever and weakness. EXAM: CHEST - 2 VIEW COMPARISON:  08/22/2018 FINDINGS: Stable appearance of right suprahilar soft tissue mass. Geometric opacity in the right lower hemithorax likely corresponds to previously demonstrated right middle and right lower lobe collapse. There is a superimposed right pleural effusion. Osseous structures are without acute abnormality. Soft tissues are grossly normal. IMPRESSION: Stable appearance of large right suprahilar soft tissue mass. Geometric opacity in the right lower hemithorax likely corresponds to previously demonstrated right middle and right lower lobe collapse. There is a superimposed right pleural effusion. Electronically Signed   By: Fidela Salisbury M.D.   On: 08/30/2018 18:09    Pending Labs Unresulted Labs (From admission, onward)    Start     Ordered   08/31/18 0500  Prealbumin  Tomorrow morning,   R     08/30/18 1911   08/30/18 1941  C difficile quick scan w PCR reflex  (C Difficile quick screen w PCR reflex panel)  Once, for 24 hours,   R     08/30/18 1940   08/30/18 1912  Phosphorus  Add-on,   R     08/30/18 1911   08/30/18 1718  Culture, blood (routine x 2)  BLOOD CULTURE X 2,   STAT     08/30/18 1717   08/30/18 1718  Urine culture  STAT,   STAT     08/30/18 1717   08/30/18 1631  Urinalysis, Routine w reflex microscopic  Once,   STAT     08/30/18 1630   Signed and Held  Procalcitonin  Add-on,   R     Signed and Held   Signed and Held  Hemoglobin A1c  Tomorrow morning,   R    Comments:  To assess prior glycemic control    Signed and Held   Signed and Held  Culture, sputum-assessment  Once,   R    Question:  Patient immune status  Answer:  Normal   Signed and Held   Signed and Held  Gram stain  Once,   R    Question:  Patient immune status  Answer:  Normal   Signed and Held   Signed and Held  Strep pneumoniae urinary antigen  Once,   R      Signed and Held   Signed and Held  Legionella Pneumophila Serogp 1 Ur Ag  Once,   R     Signed and Held          Vitals/Pain Today's Vitals   08/30/18 1801 08/30/18 1830 08/30/18 1900 08/30/18 1934  BP:  99/65 (!) 108/57 116/65  Pulse:  (!) 105 (!) 107 (!) 111  Resp:  (!) 25 15 (!) 32  Temp:      TempSrc:      SpO2:  93% 94% 94%  Weight: 55 kg     Height: '4\' 4"'  (1.321 m)     PainSc:        Isolation Precautions Enteric precautions (UV disinfection)  Medications Medications  vancomycin (VANCOCIN) 1,250 mg in sodium chloride 0.9 % 250 mL IVPB (1,250 mg Intravenous New Bag/Given 08/30/18 1850)  potassium chloride 10 mEq in 100 mL IVPB (has no administration in time range)  metroNIDAZOLE (FLAGYL) IVPB 500 mg (has no administration in time range)  magnesium sulfate IVPB 2 g 50 mL (has no administration in time range)  sodium chloride 0.9 % bolus 1,000 mL (1,000 mLs Intravenous New Bag/Given 08/30/18 1803)  ceFEPIme (MAXIPIME) 2 g in sodium chloride 0.9 % 100 mL IVPB (0 g Intravenous Stopped 08/30/18 1850)  potassium chloride SA (K-DUR,KLOR-CON) CR tablet 40 mEq (40 mEq Oral Given 08/30/18 1815)    Mobility walks

## 2018-08-30 NOTE — Progress Notes (Signed)
A consult was received from an ED physician for vancomycin and cefepime per pharmacy dosing.  The patient's profile has been reviewed for ht/wt/allergies/indication/available labs.   A one time order has been placed for vancomycin 1250 mg and cefepime 2 gm.    Further antibiotics/pharmacy consults should be ordered by admitting physician if indicated.                       Thank you, Eudelia Bunch, Pharm.D [phone] 08/30/2018 5:33 PM

## 2018-08-31 DIAGNOSIS — R739 Hyperglycemia, unspecified: Secondary | ICD-10-CM

## 2018-08-31 DIAGNOSIS — D649 Anemia, unspecified: Secondary | ICD-10-CM

## 2018-08-31 DIAGNOSIS — E876 Hypokalemia: Secondary | ICD-10-CM

## 2018-08-31 LAB — CBC
HEMATOCRIT: 27.6 % — AB (ref 36.0–46.0)
HEMOGLOBIN: 9 g/dL — AB (ref 12.0–15.0)
MCH: 29.3 pg (ref 26.0–34.0)
MCHC: 32.6 g/dL (ref 30.0–36.0)
MCV: 89.9 fL (ref 78.0–100.0)
Platelets: 142 10*3/uL — ABNORMAL LOW (ref 150–400)
RBC: 3.07 MIL/uL — ABNORMAL LOW (ref 3.87–5.11)
RDW: 18.8 % — ABNORMAL HIGH (ref 11.5–15.5)
WBC: 13.5 10*3/uL — AB (ref 4.0–10.5)

## 2018-08-31 LAB — HEMOGLOBIN A1C
HEMOGLOBIN A1C: 7.4 % — AB (ref 4.8–5.6)
MEAN PLASMA GLUCOSE: 165.68 mg/dL

## 2018-08-31 LAB — COMPREHENSIVE METABOLIC PANEL
ALBUMIN: 1.9 g/dL — AB (ref 3.5–5.0)
ALT: 20 U/L (ref 0–44)
ANION GAP: 9 (ref 5–15)
AST: 16 U/L (ref 15–41)
Alkaline Phosphatase: 114 U/L (ref 38–126)
BILIRUBIN TOTAL: 0.6 mg/dL (ref 0.3–1.2)
BUN: 8 mg/dL (ref 8–23)
CHLORIDE: 104 mmol/L (ref 98–111)
CO2: 25 mmol/L (ref 22–32)
Calcium: 7.6 mg/dL — ABNORMAL LOW (ref 8.9–10.3)
Creatinine, Ser: 0.35 mg/dL — ABNORMAL LOW (ref 0.44–1.00)
GFR calc Af Amer: >60 mL/min (ref >60)
GFR calc non Af Amer: >60 mL/min (ref >60)
GLUCOSE: 116 mg/dL — AB (ref 70–99)
POTASSIUM: 3.4 mmol/L — AB (ref 3.5–5.1)
Sodium: 138 mmol/L (ref 135–145)
Total Protein: 5.3 g/dL — ABNORMAL LOW (ref 6.5–8.1)

## 2018-08-31 LAB — MAGNESIUM: MAGNESIUM: 1.9 mg/dL (ref 1.7–2.4)

## 2018-08-31 LAB — MRSA PCR SCREENING: MRSA BY PCR: NEGATIVE

## 2018-08-31 LAB — PREALBUMIN: Prealbumin: 11.1 mg/dL — ABNORMAL LOW (ref 18–38)

## 2018-08-31 LAB — TSH: TSH: 1.145 u[IU]/mL (ref 0.350–4.500)

## 2018-08-31 LAB — GLUCOSE, CAPILLARY
GLUCOSE-CAPILLARY: 169 mg/dL — AB (ref 70–99)
Glucose-Capillary: 102 mg/dL — ABNORMAL HIGH (ref 70–99)
Glucose-Capillary: 167 mg/dL — ABNORMAL HIGH (ref 70–99)
Glucose-Capillary: 115 mg/dL — ABNORMAL HIGH (ref 70–99)

## 2018-08-31 LAB — STREP PNEUMONIAE URINARY ANTIGEN: Strep Pneumo Urinary Antigen: NEGATIVE

## 2018-08-31 LAB — PHOSPHORUS: Phosphorus: 2.8 mg/dL (ref 2.5–4.6)

## 2018-08-31 MED ORDER — ENSURE ENLIVE PO LIQD: 237.0000 mL | Freq: Two times a day (BID) | ORAL | Status: DC

## 2018-08-31 MED ORDER — IPRATROPIUM BROMIDE 0.02 % IN SOLN
0.5000 mg | Freq: Three times a day (TID) | RESPIRATORY_TRACT | Status: DC
  Administered 2018-08-31 – 2018-09-02 (×6): 0.5 mg via RESPIRATORY_TRACT
  Filled 2018-08-31 (×7): qty 2.5

## 2018-08-31 MED ORDER — BENZONATATE 100 MG PO CAPS
200.0000 mg | ORAL_CAPSULE | Freq: Two times a day (BID) | ORAL | Status: DC | PRN
  Administered 2018-08-31 – 2018-09-03 (×6): 200 mg via ORAL
  Filled 2018-08-31 (×6): qty 2

## 2018-08-31 MED ORDER — LABETALOL HCL 5 MG/ML IV SOLN
10.0000 mg | Freq: Four times a day (QID) | INTRAVENOUS | Status: DC | PRN
  Filled 2018-08-31: qty 4

## 2018-08-31 NOTE — Progress Notes (Signed)
PROGRESS NOTE  Gail Moore HCW:[id number] DOB: 1951/10/11 DOA: 08/30/2018 PCP: Marco Collie, MD  HPI/Recap of past 24 hours: Gail Moore is a 67 y.o. 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 Presented with fever up to 103 at home, cough and shortness of breath.  Recently was admitted with similar presentation and discharged on 31 August during her past admission patient was diagnosed with postobstructive pneumonia secondary to lung cancer patient was transferred to [facility] hospitalist time and started radiation for therapy was able to transition off of IV antibiotics patient was evaluated by PT OT and recommended home health PT. admitted for H CAP.  08/31/2018: Patient seen and examined at bedside.  She reports generalized weakness and fatigue.  Denies hemoptysis.  She has intermittent cough.  Denies dyspnea at rest.  Assessment/Plan: Active Problems:   HCAP (healthcare-associated pneumonia)   Lung cancer ([place])   Acute respiratory failure with hypoxia (HCC)   Hypokalemia   Sepsis ([place])   Protein-calorie malnutrition, severe   Hypomagnesemia   Anemia   Hyperglycemia   Pleural effusion   S/P AKA (above knee amputation) bilateral ([place])  . HCAP (healthcare-associated pneumonia) persistent postobstructive pneumonia possibly source of febrile illness restart broad-spectrum coverage for now and metronidazole for anaerobic coverage secondary to postobstructive pneumonia.  Obtain MRSA screening.  If negative DC IV vancomycin.  Procalcitonin mildly elevated at 0.51.  Continue to monitor fever curve, WBC.  Obtain CBC in the morning. . Acute respiratory failure with hypoxia (HCC) likely secondary to postobstructive pneumonia and right-sided lobular lung collapse.  Independently reviewed chest x-ray done on admission which revealed right lung mass.  No specific lobular infiltrates noted.  Continue to supplement with oxygen as  needed.  Continue breathing treatments. . Hypokalemia we will replace and monitor on telemetry.  Repleted.  Repeat BMP in the morning . Lung cancer [facility]) -discussed with oncology on-call patient's primary oncologist does not round at [facility].  Patient will follow-up as an outpatient if patient is still admitted on Monday  could discuss with primary oncologist  goals of care overall and plan of management given recurrent admissions and worsening functional status . Protein-calorie malnutrition, severe - Albumin 1.9.  Start oral supplement.  Encourage increase protein calorie intake. . Sepsis ([place]) source being possibly postobstructive pneumonia will check UA and check for C. difficile.  For now continue IV antibiotics obtain blood cultures and sputum cultures other source of fever could be malignancy itself Generalized weakness: PT to assess, fall precautions . Hypomagnesemia we will replace and monitor on telemetry. . Anemia of chronic disease: stable continue to monitor . Steroid-induced hyperglycemia hemoglobin A1c 7.4; insulin sliding scale  . Pleural effusion -Per review of prior CT suspect large portion of a shadow consistent of collapsed lung although  pleural effusion present in the past did not seem to worsen COPD possibly with acute exacerbation-patient has worsening wheezing when off of steroids but also have noted to have elevated blood sugars.  Suspect partially wheezing could be secondary to obstructive lung mass.  But patient was doing better on higher dose of steroids for now we will increase and see if respiratory status would improve increase prednisone to 40-day and observe continue home inhalers schedule Atrovent and as needed Xopenex order flutter valve continue home inhaled steroids Accelerated hypertension: Resume home antihypertensive medications.  Start labetalol IV 10 mg as needed for systolic blood pressure greater than 176 or diastolic blood pressure  greater than 105. Sinus  tachycardia: Heart rate ranging between 110 and 120.  Not on any rate controlled medications.  Continue IV beta-blocker as needed.  Obtain twelve-lead EKG.  Code Status: DNR  Family Communication: None at bedside  Disposition Plan: Home with home health PT versus SNF   Consultants:  None  Procedures:  None  Antimicrobials:  IV cefepime and IV Flagyl  DVT prophylaxis: Subcu Lovenox daily   Objective: Vitals:   08/31/18 0224 08/31/18 0412 08/31/18 1005 08/31/18 1202  BP:  129/72 (!) 153/76   Pulse:  92 (!) 120   Resp:  18 20   Temp:  97.7 F (36.5 C) 98.6 F (37 C)   TempSrc:  Oral Oral   SpO2: 99% 100% 93% 93%  Weight:      Height:        Intake/Output Summary (Last 24 hours) at 08/31/2018 1304 Last data filed at 08/31/2018 1248 Gross per 24 hour  Intake 1592.95 ml  Output -  Net 1592.95 ml   Filed Weights   08/30/18 1801  Weight: 55 kg    Exam:  . General: 67 y.o. year-old female well developed well nourished in no acute distress.  Alert and oriented x3. . Cardiovascular: Regular rate and rhythm with no rubs or gallops.  No thyromegaly or JVD noted.   [name] Respiratory: Mild rales at bases.  No wheezes.. Good inspiratory effort. . Abdomen: Soft nontender nondistended with normal bowel sounds x4 quadrants. . Musculoskeletal: No lower extremity edema.  Bilateral above-the-knee amputation. . Skin: No ulcerative lesions noted or rashes, . Psychiatry: Mood is appropriate for condition and setting   Data Reviewed: CBC: Recent Labs  Lab 08/30/18 1653 08/31/18 0546  WBC 17.6* 13.5*  HGB 9.2* 9.0*  HCT 28.1* 27.6*  MCV 88.9 89.9  PLT 150 093*   Basic Metabolic Panel: Recent Labs  Lab 08/30/18 1653 08/31/18 0546  NA 139 138  K 2.8* 3.4*  CL 101 104  CO2 26 25  GLUCOSE 270* 116*  BUN 6* 8  CREATININE 0.63 0.35*  CALCIUM 7.7* 7.6*  MG 1.4* 1.9  PHOS 3.2 2.8   GFR: Estimated Creatinine Clearance: 41.3 mL/min (A) (by C-G formula based on SCr of  0.35 mg/dL (L)). Liver Function Tests: Recent Labs  Lab 08/30/18 1653 08/31/18 0546  AST 27 16  ALT 23 20  ALKPHOS 132* 114  BILITOT 1.1 0.6  PROT 5.5* 5.3*  ALBUMIN 1.9* 1.9*   No results for input(s): LIPASE, AMYLASE in the last 168 hours. No results for input(s): AMMONIA in the last 168 hours. Coagulation Profile: No results for input(s): INR, PROTIME in the last 168 hours. Cardiac Enzymes: No results for input(s): CKTOTAL, CKMB, CKMBINDEX, TROPONINI in the last 168 hours. BNP (last 3 results) No results for input(s): PROBNP in the last 8760 hours. HbA1C: Recent Labs    08/31/18 0546  HGBA1C 7.4*   CBG: Recent Labs  Lab 08/30/18 1651 08/30/18 2052 08/31/18 0737 08/31/18 1222  GLUCAP 260* 179* 102* 167*   Lipid Profile: No results for input(s): CHOL, HDL, LDLCALC, TRIG, CHOLHDL, LDLDIRECT in the last 72 hours. Thyroid Function Tests: Recent Labs    08/31/18 0811  TSH 1.145   Anemia Panel: No results for input(s): VITAMINB12, FOLATE, FERRITIN, TIBC, IRON, RETICCTPCT in the last 72 hours. Urine analysis:    Component Value Date/Time   COLORURINE AMBER (A) 08/30/2018 1631   APPEARANCEUR HAZY (A) 08/30/2018 1631   LABSPEC 1.032 (H) 08/30/2018 1631  PHURINE 5.0 08/30/2018 1631   GLUCOSEU NEGATIVE 08/30/2018 1631   HGBUR NEGATIVE 08/30/2018 1631   BILIRUBINUR SMALL (A) 08/30/2018 1631   KETONESUR 5 (A) 08/30/2018 1631   PROTEINUR 100 (A) 08/30/2018 1631   NITRITE NEGATIVE 08/30/2018 1631   LEUKOCYTESUR NEGATIVE 08/30/2018 1631   Sepsis Labs: @LABRCNTIP (procalcitonin:4,lacticidven:4)  ) Recent Results (from the past 240 hour(s))  Culture, blood (routine x 2)     Status: None (Preliminary result)   Collection Time: 08/30/18  6:00 PM  Result Value Ref Range Status   Specimen Description BLOOD RIGHT FOREARM  Final   Special Requests   Final    BOTTLES DRAWN AEROBIC AND ANAEROBIC Blood Culture results may not be optimal due to an excessive volume of  blood received in culture bottles Performed at [facility], [place] [address] [place], [place] [address]    Culture PENDING  Incomplete   Report Status PENDING  Incomplete  MRSA PCR Screening     Status: None   Collection Time: 08/31/18  9:47 AM  Result Value Ref Range Status   MRSA by PCR NEGATIVE NEGATIVE Final    Comment:        The GeneXpert MRSA Assay (FDA approved for NASAL specimens only), is one component of a comprehensive MRSA colonization surveillance program. It is not intended to diagnose MRSA infection nor to guide or monitor treatment for MRSA infections. Performed at [facility], [place] [address] [place], [place] [address]       Studies: Dg Chest 2 View  Result Date: 08/30/2018 CLINICAL DATA:  Fever and weakness. EXAM: CHEST - 2 VIEW COMPARISON:  08/22/2018 FINDINGS: Stable appearance of right suprahilar soft tissue mass. Geometric opacity in the right lower hemithorax likely corresponds to previously demonstrated right middle and right lower lobe collapse. There is a superimposed right pleural effusion. Osseous structures are without acute abnormality. Soft tissues are grossly normal. IMPRESSION: Stable appearance of large right suprahilar soft tissue mass. Geometric opacity in the right lower hemithorax likely corresponds to previously demonstrated right middle and right lower lobe collapse. There is a superimposed right pleural effusion. Electronically Signed   By: Fidela Salisbury M.D.   On: 08/30/2018 18:09    Scheduled Meds: . arformoterol  15 mcg Nebulization Q12H  . budesonide  0.5 mg Nebulization BID  . enoxaparin (LOVENOX) injection  40 mg Subcutaneous Q24H  . famotidine  20 mg Oral QHS  . feeding supplement (ENSURE ENLIVE)  237 mL Oral BID BM  . guaiFENesin  1,200 mg Oral BID  . insulin aspart  0-15 Units Subcutaneous TID WC  . insulin aspart  0-5 Units Subcutaneous QHS  . ipratropium  0.5 mg Nebulization TID  .  montelukast  10 mg Oral QHS  . potassium chloride  10 mEq Oral BID  . predniSONE  40 mg Oral Q breakfast    Continuous Infusions: . sodium chloride Stopped (08/31/18 0404)  . ceFEPime (MAXIPIME) IV    . metronidazole 500 mg (08/31/18 1248)  . vancomycin       LOS: 1 day     Kayleen Memos, MD [facility] Pager [phone]  If 7PM-7AM, please contact night-coverage [email or web address] Password [email or web address] 08/31/2018, 1:04 PM

## 2018-08-31 NOTE — Progress Notes (Signed)
PT Cancellation Note  Patient Details Name: Gail Moore MRN: [id number] DOB: 02-07-1951   Cancelled Treatment:    Reason Eval/Treat Not Completed: Fatigue/lethargy limiting ability to participate(pt stated she's too tired to do therapy. Will follow. )   Philomena Doheny 08/31/2018, 9:29 AM

## 2018-08-31 NOTE — Progress Notes (Addendum)
OT Cancellation Note  Patient Details Name: Gail Moore MRN: [id number] DOB: 11-20-1951   Cancelled Treatment:    Reason Eval/Treat Not Completed: Other (comment); pt reports she is "too tired", and breakfast tray just arriving. Will check back as schedule permits.  Of note checked back later this AM, pt spoke with PT and pt reports she has not had a good morning, requests to hold off on OT/PT eval at this time. Will follow.   Lou Cal, OT [facility] Pager [phone] Office [phone]    Raymondo Band 08/31/2018, 9:29 AM

## 2018-08-31 NOTE — Progress Notes (Signed)
08/31/18  1254  Notified MD of pt's HR 110-130's

## 2018-08-31 NOTE — Progress Notes (Signed)
08/31/18  1240  Patient has refused to get up to the chair with PT twice today.

## 2018-08-31 NOTE — Plan of Care (Signed)
  Problem: Nutrition: Goal: Adequate nutrition will be maintained Outcome: Progressing   Problem: Safety: Goal: Ability to remain free from injury will improve Outcome: Progressing   Problem: Pain Managment: Goal: General experience of comfort will improve Outcome: Progressing   

## 2018-08-31 NOTE — Progress Notes (Signed)
PT Cancellation Note  Patient Details Name: Sumire Halbleib MRN: [id number] DOB: December 30, 1950   Cancelled Treatment:    Reason Eval/Treat Not Completed: Other (comment)(pt stated she's had a "rough morning" and isn't up to PT at present. Will follow. )   Philomena Doheny 08/31/2018, 10:58 AM

## 2018-09-01 ENCOUNTER — Ambulatory Visit
Admission: RE | Admit: 2018-09-01 | Discharge: 2018-09-01 | Disposition: A | Discharge status: Home / Self Care | Payer: Medicare Other | Source: Ambulatory Visit | Attending: Radiation Oncology | Admitting: Radiation Oncology

## 2018-09-01 ENCOUNTER — Inpatient Hospital Stay (HOSPITAL_COMMUNITY): Payer: Medicare Other

## 2018-09-01 DIAGNOSIS — R042 Hemoptysis: Secondary | ICD-10-CM

## 2018-09-01 LAB — CBC
HCT: 25.9 % — ABNORMAL LOW (ref 36.0–46.0)
Hemoglobin: 8.5 g/dL — ABNORMAL LOW (ref 12.0–15.0)
MCH: 29 pg (ref 26.0–34.0)
MCHC: 32.8 g/dL (ref 30.0–36.0)
MCV: 88.4 fL (ref 78.0–100.0)
PLATELETS: 152 10*3/uL (ref 150–400)
RBC: 2.93 MIL/uL — AB (ref 3.87–5.11)
RDW: 18.9 % — ABNORMAL HIGH (ref 11.5–15.5)
WBC: 16.1 10*3/uL — AB (ref 4.0–10.5)

## 2018-09-01 LAB — URINE CULTURE: Culture: NO GROWTH

## 2018-09-01 LAB — BASIC METABOLIC PANEL
Anion gap: 13 (ref 5–15)
BUN: 9 mg/dL (ref 8–23)
CO2: 22 mmol/L (ref 22–32)
CREATININE: 0.44 mg/dL (ref 0.44–1.00)
Calcium: 8 mg/dL — ABNORMAL LOW (ref 8.9–10.3)
Chloride: 103 mmol/L (ref 98–111)
GFR calc Af Amer: >60 mL/min (ref >60)
GFR calc non Af Amer: >60 mL/min (ref >60)
Glucose, Bld: 88 mg/dL (ref 70–99)
POTASSIUM: 3.5 mmol/L (ref 3.5–5.1)
SODIUM: 138 mmol/L (ref 135–145)

## 2018-09-01 LAB — GLUCOSE, CAPILLARY
GLUCOSE-CAPILLARY: 97 mg/dL (ref 70–99)
Glucose-Capillary: 176 mg/dL — ABNORMAL HIGH (ref 70–99)
Glucose-Capillary: 207 mg/dL — ABNORMAL HIGH (ref 70–99)
Glucose-Capillary: 203 mg/dL — ABNORMAL HIGH (ref 70–99)

## 2018-09-01 LAB — LEGIONELLA PNEUMOPHILA SEROGP 1 UR AG: L. PNEUMOPHILA SEROGP 1 UR AG: NEGATIVE

## 2018-09-01 LAB — PROCALCITONIN: Procalcitonin: 0.87 ng/mL

## 2018-09-01 LAB — LACTIC ACID, PLASMA
LACTIC ACID, VENOUS: 0.9 mmol/L (ref 0.5–1.9)
Lactic Acid, Venous: 1.1 mmol/L (ref 0.5–1.9)

## 2018-09-01 MED ORDER — FUROSEMIDE 10 MG/ML IJ SOLN
20.0000 mg | Freq: Once | INTRAMUSCULAR | Status: AC
  Administered 2018-09-01: 20 mg via INTRAVENOUS
  Filled 2018-09-01: qty 2

## 2018-09-01 MED ORDER — METHYLPREDNISOLONE SODIUM SUCC 125 MG IJ SOLR
60.0000 mg | Freq: Two times a day (BID) | INTRAMUSCULAR | Status: DC
  Administered 2018-09-01 – 2018-09-02 (×3): 60 mg via INTRAVENOUS
  Filled 2018-09-01 (×3): qty 2

## 2018-09-01 MED ORDER — HYDROCODONE-HOMATROPINE 5-1.5 MG/5ML PO SYRP: 5.0000 mL | ORAL_SOLUTION | Freq: Four times a day (QID) | ORAL | Status: DC | PRN

## 2018-09-01 MED ORDER — HYDROCOD POLST-CPM POLST ER 10-8 MG/5ML PO SUER
5.0000 mL | Freq: Once | ORAL | Status: AC
  Administered 2018-09-01: 5 mL via ORAL
  Filled 2018-09-01: qty 5

## 2018-09-01 MED ORDER — METOPROLOL TARTRATE 5 MG/5ML IV SOLN
2.5000 mg | Freq: Once | INTRAVENOUS | Status: AC
  Administered 2018-09-01: 2.5 mg via INTRAVENOUS
  Filled 2018-09-01: qty 5

## 2018-09-01 NOTE — Evaluation (Signed)
Physical Therapy Evaluation Patient Details Name: Gail Moore MRN: [id number] DOB: 1951-11-12 Today's Date: 09/01/2018   History of Present Illness  67 y.o. 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  Pt with diagnosis of PNA last admitted in late August and is now back with fever.    Clinical Impression  The patient is mobil;izing as PTA with increased time. Patient reports feeling tired. Patient shopuld progress to DC back to home. Pt admitted with above diagnosis. Pt currently with functional limitations due to the deficits listed below (see PT Problem List).  Pt will benefit from skilled PT to increase their independence and safety with mobility to allow discharge to the venue listed below.       Follow Up Recommendations Home health PT;Supervision/Assistance - 24 hour    Equipment Recommendations  None recommended by PT    Recommendations for Other Services       Precautions / Restrictions Precautions Precautions: Fall Precaution Comments: Bil AKA Restrictions Weight Bearing Restrictions: No      Mobility  Bed Mobility Overal bed mobility: Needs Assistance Bed Mobility: Rolling;Supine to Sit Rolling: Modified independent (Device/Increase time)   Supine to sit: Min assist     General bed mobility comments: S and increased time  Transfers Overall transfer level: Needs assistance Equipment used: None Transfers: Comptroller transfers: Supervision   General transfer comment: able to scoot laterally, forwards, backwards on compliant surface with extended time but no remarkable difficulty   Ambulation/Gait                Stairs            Wheelchair Mobility    Modified Rankin (Stroke Patients Only)       Balance Overall balance assessment: Mild deficits observed, not formally  tested Sitting-balance support: Bilateral upper extremity supported Sitting balance-Leahy Scale: Good                                       Pertinent Vitals/Pain Pain Assessment: Faces Faces Pain Scale: Hurts little more Pain Location: grimacing with sitting in chair.  Pain Descriptors / Indicators: Aching Pain Intervention(s): Monitored during session;Limited activity within patient's tolerance    Home Living Family/patient expects to be discharged to:: Private residence Living Arrangements: Alone Available Help at Discharge: Family;Available 24 hours/day Type of Home: Mobile home Home Access: Ramped entrance     Home Layout: One level Home Equipment: Shower seat;Bedside commode;Wheelchair - manual;Hand held shower head Additional Comments: uses sliding board transfers     Prior Function Level of Independence: Independent with assistive device(s)         Comments: pt cooks/cleans from W/C.  Family lives close.  Transfers to and from bed and toilet from chair with anterior/posterior tranfsfers.      Hand Dominance   Dominant Hand: Right    Extremity/Trunk Assessment   Upper Extremity Assessment Upper Extremity Assessment: Overall WFL for tasks assessed    Lower Extremity Assessment RLE Deficits / Details: R AKA, LLE Deficits / Details: L AKA    Cervical / Trunk Assessment Cervical / Trunk Assessment: Normal  Communication   Communication: No difficulties  Cognition Arousal/Alertness: Awake/alert Behavior During Therapy: WFL for tasks assessed/performed Overall Cognitive Status: History of cognitive impairments -  at baseline                                 General Comments: Son takes care of all finances.  Pt first said it was February but did correct to Sept.  Was oriented to year but thought she was a [facility].      General Comments      Exercises     Assessment/Plan    PT Assessment Patient needs continued PT  services  PT Problem List Decreased strength;Decreased activity tolerance;Decreased mobility       PT Treatment Interventions DME instruction;Functional mobility training;Therapeutic activities;Therapeutic exercise;Balance training;Cognitive remediation;Wheelchair mobility training;Patient/family education    PT Goals (Current goals can be found in the Care Plan section)  Acute Rehab PT Goals Patient Stated Goal: get to feeling better and go home PT Goal Formulation: With patient Time For Goal Achievement: 09/15/18 Potential to Achieve Goals: Fair    Frequency Min 3X/week   Barriers to discharge        Co-evaluation PT/OT/SLP Co-Evaluation/Treatment: Yes Reason for Co-Treatment: To address functional/ADL transfers PT goals addressed during session: Mobility/safety with mobility OT goals addressed during session: ADL's and self-care       AM-PAC PT "6 Clicks" Daily Activity  Outcome Measure Difficulty turning over in bed (including adjusting bedclothes, sheets and blankets)?: A Little Difficulty moving from lying on back to sitting on the side of the bed? : A Little Difficulty sitting down on and standing up from a chair with arms (e.g., wheelchair, bedside commode, etc,.)?: Unable Help needed moving to and from a bed to chair (including a wheelchair)?: Total Help needed walking in hospital room?: Total Help needed climbing 3-5 steps with a railing? : Total 6 Click Score: 10    End of Session   Activity Tolerance: Patient tolerated treatment well Patient left: with call bell/phone within reach;in chair Nurse Communication: Mobility status PT Visit Diagnosis: Other abnormalities of gait and mobility (R26.89);Muscle weakness (generalized) (M62.81);Difficulty in walking, not elsewhere classified (R26.2)    Time: 3704-8889 PT Time Calculation (min) (ACUTE ONLY): 24 min   Charges:   PT Evaluation $PT Eval Low Complexity: 1 Low          [place]  PT [phone]   Claretha Cooper 09/01/2018, 12:54 PM

## 2018-09-01 NOTE — Evaluation (Addendum)
Clinical/Bedside Swallow Evaluation Patient Details  Name: Gail Moore MRN: [id number] Date of Birth: [birth year]  Today's Date: 09/01/2018 Time: SLP Start Time (ACUTE ONLY): 1250 SLP Stop Time (ACUTE ONLY): 1309 SLP Time Calculation (min) (ACUTE ONLY): 19 min  Past Medical History:  Past Medical History:  Diagnosis Date  . Asthma   . Peripheral vascular disease [facility])    Past Surgical History:  Past Surgical History:  Procedure Laterality Date  . ABOVE KNEE LEG AMPUTATION Bilateral   . APPENDECTOMY    . COLON SURGERY    . VIDEO BRONCHOSCOPY Bilateral 07/31/2018   Procedure: VIDEO BRONCHOSCOPY WITHOUT FLUORO;  Surgeon: Tanda Rockers, MD;  Location: [facility];  Service: Cardiopulmonary;  Laterality: Bilateral;   HPI:  67 y.o. 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  Pt with diagnosis of PNA last admitted in late August and is now back with fever   Assessment / Plan / Recommendation Clinical Impression  Patient passed 3 ounce water test without deficits.  She however does report issues with swallowing with foods x 2 weeks stating she has a mass compressing on throat/esophagus.  SLP questions cervical esophageal/distal pharynx issues.  Advised pt to strategies that may be helpful.  At times she will drink liquids to aid clearance.  In addition, pt states she will have to cough/expectorate food - (just  food only - not with secretions).  Given pt recently admitted with pna, currently with pulmonary compromise and overt dysphagia with foods, will proceed with MBS next date. Pt agreeable to plan.   SLP Visit Diagnosis: Dysphagia, pharyngoesophageal phase (R13.14)    Aspiration Risk  Mild aspiration risk    Diet Recommendation Regular;Thin liquid   Liquid Administration via: Cup;Straw Medication Administration: Whole meds with liquid Supervision: Patient able to self  feed Compensations: Minimize environmental distractions;Slow rate;Small sips/bites;Other (Comment)(drink liquids t/o meal) Postural Changes: Seated upright at 90 degrees;Remain upright for at least 30 minutes after po intake    Other  Recommendations Oral Care Recommendations: Oral care BID   Follow up Recommendations (tbd)      Frequency and Duration   n/a         Prognosis    unknown    Swallow Study   General Date of Onset: 09/01/18 HPI: 67 y.o. 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  Pt with diagnosis of PNA last admitted in late August and is now back with fever Type of Study: Bedside Swallow Evaluation Diet Prior to this Study: Regular;Thin liquids Temperature Spikes Noted: No Respiratory Status: Room air History of Recent Intubation: No Behavior/Cognition: Alert;Cooperative;Pleasant mood Oral Cavity Assessment: Within Functional Limits Oral Care Completed by SLP: Yes Oral Cavity - Dentition: Dentures, top;Other (Comment)(lowers present) Vision: Functional for self-feeding Self-Feeding Abilities: Able to feed self Patient Positioning: Upright in bed Baseline Vocal Quality: Normal Volitional Cough: Strong Volitional Swallow: Unable to elicit    Oral/Motor/Sensory Function Overall Oral Motor/Sensory Function: Within functional limits   Ice Chips Ice chips: Not tested   Thin Liquid Thin Liquid: Within functional limits Presentation: Cup Other Comments: 3 ounce water test passed easily    Nectar Thick Nectar Thick Liquid: Not tested   Honey Thick Honey Thick Liquid: Not tested   Puree Puree: Within functional limits Presentation: Self  Fed;Spoon   Solid     Solid: Within functional limits Presentation: Self Fed;Spoon Other Comments: pt reports food lodging in right side of throat, requiring liquids to clear      Macario Golds 09/01/2018,1:14 PM   Luanna Salk, [facility] SLP [phone]

## 2018-09-01 NOTE — Evaluation (Signed)
Occupational Therapy Evaluation Patient Details Name: Gail Moore MRN: [id number] DOB: [birth year] Today's Date: 09/01/2018    History of Present Illness 67 y.o. 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  Pt with diagnosis of PNA last admitted in late August and is now back with fever.     Clinical Impression   Pt admitted with the above diagnosis and has the deficits listed below. Pt would benefit from cont OT to increase independence with basic adls and adl transfers so she can d/c home alone with HHOT and family close by. Pt is mobilizing well and is very motivated to be independent.     Follow Up Recommendations  Home health OT;Supervision/Assistance - 24 hour    Equipment Recommendations  None recommended by OT    Recommendations for Other Services       Precautions / Restrictions Precautions Precautions: Fall Precaution Comments: Bil AKA Restrictions Weight Bearing Restrictions: No      Mobility Bed Mobility Overal bed mobility: Needs Assistance Bed Mobility: Rolling;Supine to Sit Rolling: Modified independent (Device/Increase time)   Supine to sit: Min assist     General bed mobility comments: S and increased time  Transfers Overall transfer level: Needs assistance Equipment used: None Transfers: Comptroller transfers: Supervision   General transfer comment: able to scoot laterally, forwards, backwards on compliant surface with extended time but no remarkable difficulty     Balance Overall balance assessment: Mild deficits observed, not formally tested Sitting-balance support: Bilateral upper extremity supported Sitting balance-Leahy Scale: Good                                     ADL either performed or assessed with clinical judgement   ADL Overall ADL's : Needs  assistance/impaired Eating/Feeding: Independent;Bed level Eating/Feeding Details (indicate cue type and reason): HOB raised Grooming: Set up;Sitting Grooming Details (indicate cue type and reason): EOB Upper Body Bathing: Set up;Sitting Upper Body Bathing Details (indicate cue type and reason): EOB Lower Body Bathing: Minimal assistance;Sitting/lateral leans Lower Body Bathing Details (indicate cue type and reason): EOB Upper Body Dressing : Set up;Sitting Upper Body Dressing Details (indicate cue type and reason): EOB Lower Body Dressing: Minimal assistance;Sitting/lateral leans Lower Body Dressing Details (indicate cue type and reason): EOB Toilet Transfer: Minimal assistance Toilet Transfer Details (indicate cue type and reason): anterior/posterior transfer to [place] Manipulation and Hygiene: Total assistance;Bed level       Functional mobility during ADLs: Min guard General ADL Comments: pt performed transfer to chair scooting backwards into chair from bed.  Pt did well. Educated NT on technique for transfer     Vision Baseline Vision/History: Wears glasses Wears Glasses: At all times Patient Visual Report: No change from baseline Vision Assessment?: No apparent visual deficits     Perception Perception Perception Tested?: No   Praxis      Pertinent Vitals/Pain Pain Assessment: Faces Faces Pain Scale: Hurts little more Pain Location: grimacing with sitting in chair.  Pain Descriptors / Indicators: Aching Pain Intervention(s): Limited activity within patient's tolerance;Monitored during session;Repositioned     Hand Dominance Right   Extremity/Trunk Assessment Upper Extremity Assessment Upper Extremity Assessment: Overall WFL for tasks assessed   Lower Extremity Assessment RLE Deficits / Details: R AKA, LLE Deficits /  Details: L AKA   Cervical / Trunk Assessment Cervical / Trunk Assessment: Normal   Communication Communication Communication:  No difficulties   Cognition Arousal/Alertness: Awake/alert Behavior During Therapy: WFL for tasks assessed/performed Overall Cognitive Status: History of cognitive impairments - at baseline                                 General Comments: Son takes care of all finances.  Pt first said it was February but did correct to Sept.  Was oriented to year but thought she was a [facility].   General Comments       Exercises     Shoulder Instructions      Home Living Family/patient expects to be discharged to:: Private residence Living Arrangements: Alone Available Help at Discharge: Family;Available 24 hours/day Type of Home: Mobile home Home Access: Ramped entrance     Home Layout: One level     Bathroom Shower/Tub: Occupational psychologist: Standard Bathroom Accessibility: Yes How Accessible: Accessible via wheelchair Home Equipment: Shower seat;Bedside commode;Wheelchair - manual;Hand held shower head   Additional Comments: uses sliding board transfers       Prior Functioning/Environment Level of Independence: Independent with assistive device(s)        Comments: pt cooks/cleans from W/C.  Family lives close.  Transfers to and from bed and toilet from chair with anterior/posterior tranfsfers.         OT Problem List: Impaired balance (sitting and/or standing)      OT Treatment/Interventions: Self-care/ADL training;Balance training;Therapeutic activities;DME and/or AE instruction;Patient/family education    OT Goals(Current goals can be found in the care plan section) Acute Rehab OT Goals Patient Stated Goal: get to feeling better and go home OT Goal Formulation: With patient Time For Goal Achievement: 09/15/18 Potential to Achieve Goals: Good ADL Goals Pt Will Perform Lower Body Dressing: with supervision;sitting/lateral leans Pt Will Perform Tub/Shower Transfer: Shower transfer;with supervision;shower seat Additional ADL Goal #1: Pt will  transfer to [facility] with anterior/posterior transfer, toilet, clean self and pull pants up with Supervision.  OT Frequency: Min 2X/week   Barriers to D/C: Decreased caregiver support  Pt does live alone but stated family can be there quickly.       Co-evaluation PT/OT/SLP Co-Evaluation/Treatment: Yes Reason for Co-Treatment: To address functional/ADL transfers PT goals addressed during session: Mobility/safety with mobility OT goals addressed during session: ADL's and self-care      AM-PAC PT "6 Clicks" Daily Activity     Outcome Measure Help from another person eating meals?: None Help from another person taking care of personal grooming?: None Help from another person toileting, which includes using toliet, bedpan, or urinal?: A Little Help from another person bathing (including washing, rinsing, drying)?: A Little Help from another person to put on and taking off regular upper body clothing?: None Help from another person to put on and taking off regular lower body clothing?: A Little 6 Click Score: 21   End of Session Nurse Communication: Mobility status  Activity Tolerance: Patient tolerated treatment well;Patient limited by fatigue Patient left: in chair;with call bell/phone within reach  OT Visit Diagnosis: Other abnormalities of gait and mobility (R26.89);Muscle weakness (generalized) (M62.81)                Time: 7564-3329 OT Time Calculation (min): 24 min Charges:  OT General Charges $OT Visit: 1 Visit OT Evaluation $OT Eval Moderate Complexity: 1 Mod  Jinger Neighbors, OTR/L [phone]  Glenford Peers 09/01/2018, 11:33 AM

## 2018-09-01 NOTE — Progress Notes (Signed)
Pt continues to have coughing spells. + Hemoptysis. Oncall MD paged. PRN cough meds ordered and given, See MAR. RT called and PRN tx given. Will continue to monitor.

## 2018-09-01 NOTE — Care Management Note (Signed)
Case Management Note  Patient Details  Name: Gail Moore MRN: [id number] Date of Birth: [birth year]  Subjective/Objective:                  From the chart: HPI/Recap of past 24 hours: [name] a 67 y.o.femalewith medical history significant of small cell CA of the lung (adeno CA), PAD status post BLAKA, COPD. Presented withfeverup to 103 at home, cough and shortness of breath. Recently was admitted with similar presentation and discharged on 31 August during her past admission patient was diagnosed with postobstructive pneumonia secondary to lung cancer patient was transferred to [facility] hospitalist time and started radiation for therapy was able to transition off of IV antibiotics patient was evaluated by PT OT and recommended home health PT. admitted for H CAP.  08/31/2018: Patient seen and examined at bedside.  She reports generalized weakness and fatigue.  Denies hemoptysis.  She has intermittent cough.  Denies dyspnea at rest.  09/01/2018: Patient seen and examined at her bedside.  Reports hemoptysis last night about a cupful.  Dyspnea is improving however with her breathing treatments.  Remains tachycardic.  Last twelve-lead EKG revealed sinus tachycardia with rate of 112 with no specific ST-T changes.  Not on any AV nodal blockade medications.  Will consult cardiology.  Denies chest pain and admits to mild palpitations.  Assessment/Plan: Active Problems:   HCAP (healthcare-associated pneumonia)   Lung cancer ([place])   Acute respiratory failure with hypoxia (HCC)   Hypokalemia   Sepsis ([place])   Protein-calorie malnutrition, severe   Hypomagnesemia   Anemia   Hyperglycemia   Pleural effusion   S/P AKA (above knee amputation) bilateral ([place])  . HCAP (healthcare-associated pneumonia)persistent postobstructive pneumonia possibly source of febrile illness restart broad-spectrum coverage for now and metronidazole for anaerobic coverage secondary to postobstructive  pneumonia.  MRSA screening negative.  DC IV vancomycin.  Procalcitonin 0.86.  Continue to monitor fever curve, WBC.  Continue IV antibiotics.   . Acute respiratory failure with hypoxia (HCC)likely secondary to postobstructive pneumonia and right-sided lobular lung collapse.  Independently reviewed chest x-ray done on admission which revealed right lung mass.  No specific lobular infiltrates noted.  Continue to supplement with oxygen as needed.  Continue breathing treatments. . Resolved hypokalemiawe will replace and monitor on telemetry.  Repleted.  Repeat BMP in the morning . Lung cancer (HCC)-admitting physician discussed with oncology on-call patient's primary oncologist does not round at [facility]. Patient will follow-up as an outpatient if patient is still admitted on Monday could discuss with primary oncologist goals of care overall andplan of management given recurrent admissions and worsening functional status . Protein-calorie malnutrition, severe-Albumin 1.9.  Continue oral supplement.  Encourage increase protein calorie intake. . Sepsis (HCC)source being possibly postobstructive pneumonia will check UA and check for C. difficile. For now continue IV antibiotics obtain blood cultures and sputum cultures other source of fever could be malignancy itself Generalized weakness: PT to assess, fall precautions.  OT recommends home health OT. [name] Hypomagnesemia-repleted  . Anemia of chronic disease: Hemoglobin stable at 8.5.  No sign of overt bleeding. . Steroid-induced hyperglycemiahemoglobin A1c 7.4; insulin sliding scale  . Pleural effusion-Per review of prior CT suspect large portion of a shadow consistent of collapsed lung although pleural effusion present in the past did not seem to worsen COPD possibly with acute exacerbation-patient has worsening wheezing when off of steroids but also have noted to have elevated blood sugars. Suspect partially wheezing could be secondary to  obstructive lung  mass. But patient was doing better on higher dose of steroids for now we will increase and see if respiratory status would improve increase prednisone to 40-day and observe continue home inhalers schedule Atrovent and as needed Xopenex order flutter valve.  Start IV Solu-Medrol 60 mg twice daily.  Hold p.o. steroid. Resolved accelerated hypertension: Continue home antihypertensive medications.  Continue labetalol IV 10 mg as needed for systolic blood pressure greater than 356 or diastolic blood pressure greater than 105. Sinus tachycardia: Heart rate ranging between 110 and 120.  Not on any rate controlled/AV nodal blockade medications.  Continue IV Lopressor as needed.  Continue to monitor on telemetry.  Consult cardiology.  Action/Plan: Following for progression and cm needs  Expected Discharge Date:                  Expected Discharge Plan:  [place]  In-House Referral:     Discharge planning Services  CM Consult  Post Acute Care Choice:  Home Health Choice offered to:  Adult Children  DME Arranged:    DME Agency:     HH Arranged:  PT, OT, Nurse's Aide [place] Agency:  [place]  Status of Service:  In process, will continue to follow  If discussed at Long Length of Stay Meetings, dates discussed:    Additional Comments:  Leeroy Cha, RN 09/01/2018, 3:17 PM

## 2018-09-01 NOTE — Progress Notes (Signed)
PROGRESS NOTE  [name] CVE:[id number] DOB: 27-Oct-1951 DOA: 08/30/2018 PCP: Marco Collie, MD  HPI/Recap of past 24 hours: Gail Moore is a 67 y.o. 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 Presented with fever up to 103 at home, cough and shortness of breath.  Recently was admitted with similar presentation and discharged on 31 August during her past admission patient was diagnosed with postobstructive pneumonia secondary to lung cancer patient was transferred to [facility] hospitalist time and started radiation for therapy was able to transition off of IV antibiotics patient was evaluated by PT OT and recommended home health PT. admitted for H CAP.  08/31/2018: Patient seen and examined at bedside.  She reports generalized weakness and fatigue.  Denies hemoptysis.  She has intermittent cough.  Denies dyspnea at rest.  09/01/2018: Patient seen and examined at her bedside.  Reports hemoptysis last night about a cupful.  Dyspnea is improving however with her breathing treatments.  Remains tachycardic.  Last twelve-lead EKG revealed sinus tachycardia with rate of 112 with no specific ST-T changes.  Not on any AV nodal blockade medications.  Will consult cardiology.  Denies chest pain and admits to mild palpitations.  Assessment/Plan: Active Problems:   HCAP (healthcare-associated pneumonia)   Lung cancer ([place])   Acute respiratory failure with hypoxia (HCC)   Hypokalemia   Sepsis ([place])   Protein-calorie malnutrition, severe   Hypomagnesemia   Anemia   Hyperglycemia   Pleural effusion   S/P AKA (above knee amputation) bilateral ([place])  . HCAP (healthcare-associated pneumonia) persistent postobstructive pneumonia possibly source of febrile illness restart broad-spectrum coverage for now and metronidazole for anaerobic coverage secondary to postobstructive pneumonia.  MRSA screening negative.  DC IV vancomycin.  Procalcitonin  0.86.  Continue to monitor fever curve, WBC.  Continue IV antibiotics.   . Acute respiratory failure with hypoxia (HCC) likely secondary to postobstructive pneumonia and right-sided lobular lung collapse.  Independently reviewed chest x-ray done on admission which revealed right lung mass.  No specific lobular infiltrates noted.  Continue to supplement with oxygen as needed.  Continue breathing treatments. . Resolved hypokalemia we will replace and monitor on telemetry.  Repleted.  Repeat BMP in the morning . Lung cancer [facility]) -admitting physician discussed with oncology on-call patient's primary oncologist does not round at [facility].  Patient will follow-up as an outpatient if patient is still admitted on Monday  could discuss with primary oncologist  goals of care overall and plan of management given recurrent admissions and worsening functional status . Protein-calorie malnutrition, severe - Albumin 1.9.  Continue oral supplement.  Encourage increase protein calorie intake. . Sepsis ([place]) source being possibly postobstructive pneumonia will check UA and check for C. difficile.  For now continue IV antibiotics obtain blood cultures and sputum cultures other source of fever could be malignancy itself Generalized weakness: PT to assess, fall precautions.  OT recommends home health OT. [name] Hypomagnesemia-repleted  . Anemia of chronic disease:  Hemoglobin stable at 8.5.  No sign of overt bleeding. . Steroid-induced hyperglycemia hemoglobin A1c 7.4; insulin sliding scale  . Pleural effusion -Per review of prior CT suspect large portion of a shadow consistent of collapsed lung although  pleural effusion present in the past did not seem to worsen COPD possibly with acute exacerbation-patient has worsening wheezing when off of steroids but also have noted to have elevated blood sugars.  Suspect partially wheezing could be secondary to obstructive lung  mass.  But patient was doing better on higher dose of  steroids for now we will increase and see if respiratory status would improve increase prednisone to 40-day and observe continue home inhalers schedule Atrovent and as needed Xopenex order flutter valve.  Start IV Solu-Medrol 60 mg twice daily.  Hold p.o. steroid. Resolved accelerated hypertension: Continue home antihypertensive medications.  Continue labetalol IV 10 mg as needed for systolic blood pressure greater than 650 or diastolic blood pressure greater than 105. Sinus tachycardia: Heart rate ranging between 110 and 120.  Not on any rate controlled/AV nodal blockade medications.  Continue IV Lopressor as needed.  Continue to monitor on telemetry.  Consult cardiology.  Code Status: DNR  Family Communication: None at bedside  Disposition Plan: Home with home health PT and OT versus SNF   Consultants:  Cardiology  Procedures:  None  Antimicrobials:  IV cefepime and IV Flagyl  DVT prophylaxis: Subcu Lovenox daily   Objective: Vitals:   09/01/18 1046 09/01/18 1100 09/01/18 1101 09/01/18 1208  BP:  100/68  107/71  Pulse:  (!) 115 (!) 117 (!) 120  Resp:  (!) 24  (!) 24  Temp:  98.6 F (37 C)  98.5 F (36.9 C)  TempSrc:  Oral  Oral  SpO2: 98% 100% 100% 97%  Weight:      Height:        Intake/Output Summary (Last 24 hours) at 09/01/2018 1244 Last data filed at 09/01/2018 1211 Gross per 24 hour  Intake 520 ml  Output 1250 ml  Net -730 ml   Filed Weights   08/30/18 1801  Weight: 55 kg    Exam:  . General: 67 y.o. year-old female well-developed well-nourished in no acute distress.  Alert and oriented x3.   . Cardiovascular: Regular rate and rhythm with no rubs or gallops.  No thyromegaly or JVD noted. [name] Respiratory: Mild rales at bases.  No wheezes.. Good inspiratory effort. . Abdomen: Soft nontender nondistended with normal bowel sounds x4 quadrants. . Musculoskeletal: No lower extremity edema.  Bilateral above-the-knee amputation. . Skin: No ulcerative lesions  noted or rashes, . Psychiatry: Mood is appropriate for condition and setting   Data Reviewed: CBC: Recent Labs  Lab 08/30/18 1653 08/31/18 0546 09/01/18 0617  WBC 17.6* 13.5* 16.1*  HGB 9.2* 9.0* 8.5*  HCT 28.1* 27.6* 25.9*  MCV 88.9 89.9 88.4  PLT 150 142* 354   Basic Metabolic Panel: Recent Labs  Lab 08/30/18 1653 08/31/18 0546 09/01/18 0617  NA 139 138 138  K 2.8* 3.4* 3.5  CL 101 104 103  CO2 26 25 22   GLUCOSE 270* 116* 88  BUN 6* 8 9  CREATININE 0.63 0.35* 0.44  CALCIUM 7.7* 7.6* 8.0*  MG 1.4* 1.9  --   PHOS 3.2 2.8  --    GFR: Estimated Creatinine Clearance: 41.3 mL/min (by C-G formula based on SCr of 0.44 mg/dL). Liver Function Tests: Recent Labs  Lab 08/30/18 1653 08/31/18 0546  AST 27 16  ALT 23 20  ALKPHOS 132* 114  BILITOT 1.1 0.6  PROT 5.5* 5.3*  ALBUMIN 1.9* 1.9*   No results for input(s): LIPASE, AMYLASE in the last 168 hours. No results for input(s): AMMONIA in the last 168 hours. Coagulation Profile: No results for input(s): INR, PROTIME in the last 168 hours. Cardiac Enzymes: No results for input(s): CKTOTAL, CKMB, CKMBINDEX, TROPONINI in the last 168 hours. BNP (last 3 results) No results for input(s): PROBNP in the last 8760 hours. HbA1C: Recent  Labs    08/31/18 0546  HGBA1C 7.4*   CBG: Recent Labs  Lab 08/31/18 1222 08/31/18 1615 08/31/18 2013 09/01/18 0744 09/01/18 1210  GLUCAP 167* 169* 115* 97 176*   Lipid Profile: No results for input(s): CHOL, HDL, LDLCALC, TRIG, CHOLHDL, LDLDIRECT in the last 72 hours. Thyroid Function Tests: Recent Labs    08/31/18 0811  TSH 1.145   Anemia Panel: No results for input(s): VITAMINB12, FOLATE, FERRITIN, TIBC, IRON, RETICCTPCT in the last 72 hours. Urine analysis:    Component Value Date/Time   COLORURINE AMBER (A) 08/30/2018 1631   APPEARANCEUR HAZY (A) 08/30/2018 1631   LABSPEC 1.032 (H) 08/30/2018 1631   PHURINE 5.0 08/30/2018 1631   GLUCOSEU NEGATIVE 08/30/2018 1631    HGBUR NEGATIVE 08/30/2018 1631   BILIRUBINUR SMALL (A) 08/30/2018 1631   KETONESUR 5 (A) 08/30/2018 1631   PROTEINUR 100 (A) 08/30/2018 1631   NITRITE NEGATIVE 08/30/2018 1631   LEUKOCYTESUR NEGATIVE 08/30/2018 1631   Sepsis Labs: @LABRCNTIP (procalcitonin:4,lacticidven:4)  ) Recent Results (from the past 240 hour(s))  Urine culture     Status: None   Collection Time: 08/30/18  5:18 PM  Result Value Ref Range Status   Specimen Description   Final    URINE, CATHETERIZED Performed at [facility], [place] [address], [place], [place] [address]    Special Requests   Final    NONE Performed at [facility], [place] [address], [place], [place] [address]    Culture   Final    NO GROWTH Performed at [facility], [place] [address] [place], [place] [address]    Report Status 09/01/2018 FINAL  Final  Culture, blood (routine x 2)     Status: None (Preliminary result)   Collection Time: 08/30/18  5:55 PM  Result Value Ref Range Status   Specimen Description   Final    BLOOD LEFT HAND Performed at [facility], [place] [address] [place], [place] [address]    Special Requests   Final    BOTTLES DRAWN AEROBIC AND ANAEROBIC Blood Culture results may not be optimal due to an excessive volume of blood received in culture bottles Performed at [place] [address] [place], [place] [address]    Culture   Final    NO GROWTH 1 DAY Performed at [facility], [place] [address] [place], [place] [address]    Report Status PENDING  Incomplete  Culture, blood (routine x 2)     Status: None (Preliminary result)   Collection Time: 08/30/18  6:00 PM  Result Value Ref Range Status   Specimen Description   Final    BLOOD RIGHT FOREARM Performed at [facility], [place] [address], [place], [place] [address]    Special Requests   Final    BOTTLES DRAWN AEROBIC AND ANAEROBIC Blood Culture results may not be optimal due to an  excessive volume of blood received in culture bottles Performed at [place] [address], [place], [place] [address]    Culture   Final    NO GROWTH 1 DAY Performed at [facility], [place] [address], [place], [place] [address]    Report Status PENDING  Incomplete  MRSA PCR Screening     Status: None   Collection Time: 08/31/18  9:47 AM  Result Value Ref Range Status   MRSA by PCR NEGATIVE NEGATIVE Final    Comment:  The GeneXpert MRSA Assay (FDA approved for NASAL specimens only), is one component of a comprehensive MRSA colonization surveillance program. It is not intended to diagnose MRSA infection nor to guide or monitor treatment for MRSA infections. Performed at [facility], [place] [address] [place], [place] [address]       Studies: Dg Chest Port 1 View  Result Date: 09/01/2018 CLINICAL DATA:  Hemoptysis. EXAM: PORTABLE CHEST 1 VIEW COMPARISON:  Radiograph of August 30, 2018. CT scan of August 16, 2018. FINDINGS: Stable appearance of large right suprahilar mass. Left lung is clear. Stable atelectasis of right lower lobe is noted with possible associated pleural effusion. Bony thorax is unremarkable. No pneumothorax is noted. IMPRESSION: Stable appearance of large right suprahilar mass is noted with stable atelectasis of right lower lobe with possible associated pleural effusion, most likely postobstructive in etiology. Electronically Signed   By: Marijo Conception, M.D.   On: 09/01/2018 07:49    Scheduled Meds: . arformoterol  15 mcg Nebulization Q12H  . budesonide  0.5 mg Nebulization BID  . enoxaparin (LOVENOX) injection  40 mg Subcutaneous Q24H  . famotidine  20 mg Oral QHS  . feeding supplement (ENSURE ENLIVE)  237 mL Oral BID BM  . guaiFENesin  1,200 mg Oral BID  . insulin aspart  0-15 Units Subcutaneous TID WC  . insulin aspart  0-5 Units Subcutaneous QHS  . ipratropium  0.5 mg Nebulization TID  .  methylPREDNISolone (SOLU-MEDROL) injection  60 mg Intravenous Q12H  . montelukast  10 mg Oral QHS  . potassium chloride  10 mEq Oral BID    Continuous Infusions: . sodium chloride Stopped (08/31/18 0404)  . ceFEPime (MAXIPIME) IV Stopped (08/31/18 1840)  . metronidazole 500 mg (09/01/18 1211)     LOS: 2 days     Kayleen Memos, MD [facility] Pager [phone]  If 7PM-7AM, please contact night-coverage [email or web address] Password [email or web address] 09/01/2018, 12:44 PM

## 2018-09-01 NOTE — Progress Notes (Signed)
   09/01/18 0756  MEWS Score  Resp 20  Pulse Rate (!) 133  BP (!) 103/55  Temp 98.5 F (36.9 C)  SpO2 100 %  O2 Device Room Air  MEWS Score  MEWS RR 0  MEWS Pulse 3  MEWS Systolic 0  MEWS LOC 0  MEWS Temp 0  MEWS Score 3  MEWS Score Color Yellow  MEWS Assessment  Is this an acute change? Yes  MEWS guidelines implemented *See Row Information* Yellow  Provider Notification  Provider Name/Title [place] Crane   Date Provider Notified 09/01/18  Time Provider Notified 0800  Notification Type Page  Notification Reason Change in status  Response See new orders (lopressor order )  Date of Provider Response 09/01/18  Time of Provider Response 0820 (was at the bedside )

## 2018-09-01 NOTE — Progress Notes (Signed)
Initial Nutrition Assessment  DOCUMENTATION CODES:   Non-severe (moderate) malnutrition in context of chronic illness  INTERVENTION:   -Continue Ensure Enlive po BID, each supplement provides 350 kcal and 20 grams of protein -Provide Magic cup BID with meals, each supplement provides 290 kcal and 9 grams of protein  NUTRITION DIAGNOSIS:   Moderate Malnutrition related to chronic illness, cancer and cancer related treatments as evidenced by moderate fat depletion, moderate muscle depletion.  GOAL:   Patient will meet greater than or equal to 90% of their needs  MONITOR:   PO intake, Supplement acceptance, Labs, Weight trends, I & O's, Skin  REASON FOR ASSESSMENT:   Consult Assessment of nutrition requirement/status  ASSESSMENT:   67 y.o. 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 Presented with fever up to 103 at home, cough and shortness of breath.  Admitted for HCAP.  Patient consuming 25-100% of meals this admission. Was followed by nutrition team during previous admission 8/26-8/31. Patient continues to report difficulty swallowing. SLP evaluated today and recommended MBS for tomorrow. Pt to continue regular diet at this time. Pt with new stage 2 sacral pressure injury. Needs additional kcal and protein via Ensure and Magic Cups.   Per weight records, weight is the same as 2 weeks ago. May need to re-weigh.  Medications: K-DUR tablet BID Labs reviewed: CBGs: 176   NUTRITION - FOCUSED PHYSICAL EXAM:    Most Recent Value  Orbital Region  Mild depletion  Upper Arm Region  Moderate depletion  Thoracic and Lumbar Region  Unable to assess  Buccal Region  No depletion  Temple Region  Moderate depletion  Clavicle Bone Region  Mild depletion  Clavicle and Acromion Bone Region  Mild depletion  Scapular Bone Region  Unable to assess  Dorsal Hand  Moderate depletion  Patellar Region  Unable to assess   Anterior Thigh Region  Unable to assess  Posterior Calf Region  Unable to assess  Edema (RD Assessment)  Unable to assess       Diet Order:   Diet Order            Diet Carb Modified Fluid consistency: Thin; Room service appropriate? Yes  Diet effective now              EDUCATION NEEDS:   No education needs have been identified at this time  Skin:  Skin Assessment: Skin Integrity Issues: Skin Integrity Issues:: Stage II Stage II: sacrum  Last BM:  9/7  Height:   Ht Readings from Last 1 Encounters:  08/30/18 4\' 4"  (1.321 m)    Weight:   Wt Readings from Last 1 Encounters:  08/30/18 55 kg    Ideal Body Weight:  (unable to calculate)  BMI:  Unknown. Need height prior to bilateral AKAs.  Estimated Nutritional Needs:   Kcal:  1600-1800  Protein:  70-80g  Fluid:  1.6L/day  Clayton Bibles, MS, RD, LDN [place] [facility] Pager: [phone] After Hours Pager: [phone]

## 2018-09-01 NOTE — Care Management Important Message (Signed)
Important Message  Patient Details  Name: Gail Moore MRN: [id number] Date of Birth: 1951-05-25   Medicare Important Message Given:  Yes    Kerin Salen 09/01/2018, 12:02 [place] Message  Patient Details  Name: Gail Moore MRN: [id number] Date of Birth: [birth year]   Medicare Important Message Given:  Yes    Kerin Salen 09/01/2018, 12:02 PM

## 2018-09-01 NOTE — Progress Notes (Signed)
Pt coughing unrelieved w/ PRN cough meds. + Hemoptysis, increased inspiratory and expiratory wheezing. RT called. Oncall MD paged. CXR ordered. HR and RR slightly elevated. Will continue to monitor.

## 2018-09-01 NOTE — Progress Notes (Signed)
   09/01/18 1100  MEWS Score  Resp (!) 24  Pulse Rate (!) 115  BP 100/68  Temp 98.6 F (37 C)  SpO2 100 %  O2 Device Room Air  MEWS Score  MEWS RR 1  MEWS Pulse 2  MEWS Systolic 1  MEWS LOC 0  MEWS Temp 0  MEWS Score 4  MEWS Score Color Red  MEWS Assessment  Is this an acute change? No (Meds were given for HR , which lowered BP. RR has been up)  MEWS guidelines implemented *See Row Information* Red

## 2018-09-02 ENCOUNTER — Ambulatory Visit
Admission: RE | Admit: 2018-09-02 | Discharge: 2018-09-02 | Disposition: A | Discharge status: Home / Self Care | Payer: Medicare Other | Source: Ambulatory Visit | Attending: Radiation Oncology | Admitting: Radiation Oncology

## 2018-09-02 ENCOUNTER — Inpatient Hospital Stay (HOSPITAL_COMMUNITY): Payer: Medicare Other

## 2018-09-02 ENCOUNTER — Encounter (HOSPITAL_COMMUNITY): Payer: Self-pay | Admitting: Cardiology

## 2018-09-02 ENCOUNTER — Other Ambulatory Visit (HOSPITAL_COMMUNITY): Payer: Medicare Other

## 2018-09-02 DIAGNOSIS — I509 Heart failure, unspecified: Secondary | ICD-10-CM

## 2018-09-02 DIAGNOSIS — J189 Pneumonia, unspecified organism: Secondary | ICD-10-CM

## 2018-09-02 DIAGNOSIS — E440 Moderate protein-calorie malnutrition: Secondary | ICD-10-CM

## 2018-09-02 DIAGNOSIS — L8990 Pressure ulcer of unspecified site, unspecified stage: Secondary | ICD-10-CM

## 2018-09-02 DIAGNOSIS — J9601 Acute respiratory failure with hypoxia: Secondary | ICD-10-CM

## 2018-09-02 DIAGNOSIS — I471 Supraventricular tachycardia: Secondary | ICD-10-CM

## 2018-09-02 LAB — CBC
HEMATOCRIT: 26.1 % — AB (ref 36.0–46.0)
HEMOGLOBIN: 8.6 g/dL — AB (ref 12.0–15.0)
MCH: 29.3 pg (ref 26.0–34.0)
MCHC: 33 g/dL (ref 30.0–36.0)
MCV: 88.8 fL (ref 78.0–100.0)
Platelets: 162 10*3/uL (ref 150–400)
RBC: 2.94 MIL/uL — ABNORMAL LOW (ref 3.87–5.11)
RDW: 19.1 % — ABNORMAL HIGH (ref 11.5–15.5)
WBC: 16.5 10*3/uL — ABNORMAL HIGH (ref 4.0–10.5)

## 2018-09-02 LAB — BASIC METABOLIC PANEL
Anion gap: 11 (ref 5–15)
BUN: 15 mg/dL (ref 8–23)
CHLORIDE: 104 mmol/L (ref 98–111)
CO2: 23 mmol/L (ref 22–32)
CREATININE: 0.52 mg/dL (ref 0.44–1.00)
Calcium: 8.2 mg/dL — ABNORMAL LOW (ref 8.9–10.3)
GFR calc non Af Amer: >60 mL/min (ref >60)
Glucose, Bld: 219 mg/dL — ABNORMAL HIGH (ref 70–99)
Potassium: 3.2 mmol/L — ABNORMAL LOW (ref 3.5–5.1)
Sodium: 138 mmol/L (ref 135–145)

## 2018-09-02 LAB — GLUCOSE, CAPILLARY
GLUCOSE-CAPILLARY: 214 mg/dL — AB (ref 70–99)
GLUCOSE-CAPILLARY: 191 mg/dL — AB (ref 70–99)
Glucose-Capillary: 223 mg/dL — ABNORMAL HIGH (ref 70–99)

## 2018-09-02 LAB — MAGNESIUM: Magnesium: 1.6 mg/dL — ABNORMAL LOW (ref 1.7–2.4)

## 2018-09-02 LAB — PROCALCITONIN: PROCALCITONIN: 0.59 ng/mL

## 2018-09-02 MED ORDER — POTASSIUM CHLORIDE CRYS ER 20 MEQ PO TBCR
40.0000 meq | EXTENDED_RELEASE_TABLET | Freq: Once | ORAL | Status: AC
  Administered 2018-09-02: 40 meq via ORAL
  Filled 2018-09-02: qty 2

## 2018-09-02 MED ORDER — LIDOCAINE 5 % EX PTCH
1.0000 | MEDICATED_PATCH | CUTANEOUS | Status: DC
  Administered 2018-09-03: 1 via TRANSDERMAL
  Filled 2018-09-02 (×2): qty 1

## 2018-09-02 MED ORDER — MAGNESIUM SULFATE 4 GM/100ML IV SOLN
4.0000 g | Freq: Once | INTRAVENOUS | Status: AC
  Administered 2018-09-02: 4 g via INTRAVENOUS
  Filled 2018-09-02: qty 100

## 2018-09-02 MED ORDER — DILTIAZEM HCL ER 60 MG PO CP12
60.0000 mg | ORAL_CAPSULE | Freq: Two times a day (BID) | ORAL | Status: DC
  Administered 2018-09-02 – 2018-09-03 (×3): 60 mg via ORAL
  Filled 2018-09-02 (×4): qty 1

## 2018-09-02 MED ORDER — FUROSEMIDE 10 MG/ML IJ SOLN
20.0000 mg | Freq: Two times a day (BID) | INTRAMUSCULAR | Status: DC
  Administered 2018-09-02 – 2018-09-03 (×2): 20 mg via INTRAVENOUS
  Filled 2018-09-02 (×2): qty 2

## 2018-09-02 NOTE — Progress Notes (Signed)
SLP Cancellation Note  Patient Details Name: Gail Moore MRN: [id number] DOB: Feb 03, 1951   Cancelled treatment:       Reason Eval/Treat Not Completed: Other (comment);Patient declined, no reason specified(pt stated she was tired after radiation and desired to rest after SLP offered therapeutic exercises for respiratory muscles, she agrees to participate in [facility] on schedule for approximately noon, rn aware)   Macario Golds 09/02/2018, 11:51 AM  Luanna Salk, [place] [facility] SLP [phone]

## 2018-09-02 NOTE — Consult Note (Addendum)
Cardiology Consultation:   Patient ID: Gail Moore MRN: [id number]; DOB: 11-Apr-1951  Admit date: 08/30/2018 Date of Consult: 09/02/2018  Primary Care Provider: Marco Collie, Moore Primary Cardiologist: Dr. Meda Moore New   Primary Electrophysiologist:  None    Patient Profile:   Gail Moore is a 67 y.o. female with a hx of small cell CA of lung, PAD with hx Bil AKA, COPD now admitted with fever with recent hospitalization for PNA who is being seen today for the evaluation of SVT at the request of Dr. [place] Moore.   History of Present Illness:   Ms. Gail Moore with above hx and recent admit for PNA presented to ER 08/30/18 for fever 103 and dx of HCAP.  For her lung cancer treated with radiation--plan for future immunotherapy.  She has acute respiratory failure with hypoxia likely secondary to postobstructive pneumonia and right-sided lobular lung collapse.  On IV antibiotics. She has sepsis, protein calorie malnutrition, hypomagnesium.  After admit she had run of SVT to 180 may be slightly irregular she has had several episodes.  She is aware of them and has had this happen several times per day.  She has had brief chest pain at times with this.  But very brief.  She is having trouble ambulating at home, going from bed to [facility].      EKG I personally reviewed     ST at 110 low voltage. No old EKGs to compare.  Na 138, K+ 3.2, Ca 8.2, Mg today 1.6 Hgb 8.6 WBC 16.5  plts 162 HgbA1C 7.4   TSH 1.145 BP 121/66 to 108/63   Currently resting in bed, her sister is with her. Complains of some abd pain.    Past Medical History:  Diagnosis Date  . Asthma   . Peripheral vascular disease [facility])     Past Surgical History:  Procedure Laterality Date  . ABOVE KNEE LEG AMPUTATION Bilateral   . APPENDECTOMY    . COLON SURGERY    . VIDEO BRONCHOSCOPY Bilateral 07/31/2018   Procedure: VIDEO BRONCHOSCOPY WITHOUT FLUORO;  Surgeon: Gail Moore;  Location: [facility] ENDOSCOPY;  Service: Cardiopulmonary;  Laterality:  Bilateral;     Home Medications:  Prior to Admission medications   Medication Sig Start Date End Date Taking? Authorizing Provider  albuterol (PROVENTIL) (2.5 MG/3ML) 0.083% nebulizer solution Take 2.5 mg by nebulization every 6 (six) hours as needed for wheezing or shortness of breath.   Yes Provider, Historical, Moore  amoxicillin-clavulanate (AUGMENTIN) 875-125 MG tablet Take 1 tablet by mouth 2 (two) times daily.   Yes Provider, Historical, Moore  budesonide (PULMICORT) 0.5 MG/2ML nebulizer solution Take 0.5 mg by nebulization 2 (two) times daily.   Yes Provider, Historical, Moore  famotidine (PEPCID) 20 MG tablet One at bedtime Patient taking differently: Take 20 mg by mouth at bedtime.  07/18/18  Yes Gail Moore  feeding supplement, ENSURE ENLIVE, (ENSURE ENLIVE) LIQD Take 237 mLs by mouth 2 (two) times daily between meals. 08/24/18  Yes Gail Moore  formoterol (PERFOROMIST) 20 MCG/2ML nebulizer solution Take 20 mcg by nebulization 2 (two) times daily.   Yes Provider, Historical, Moore  guaiFENesin (MUCINEX) 600 MG 12 hr tablet Take 1 tablet (600 mg total) by mouth 2 (two) times daily. 08/23/18  Yes Gail Moore  HYDROcodone-homatropine [facility]) 5-1.5 MG/5ML syrup Take 5 mLs by mouth every 6 (six) hours as needed for cough. 08/29/18  Yes Gail Moore  lip balm (CARMEX) ointment Apply topically as needed  for lip care. 08/23/18  Yes Gail Moore  montelukast (SINGULAIR) 10 MG tablet Take 10 mg by mouth at bedtime.   Yes Provider, Historical, Moore  pantoprazole (PROTONIX) 40 MG tablet Take 1 tablet (40 mg total) by mouth daily. Take 30-60 min before first meal of the day 07/18/18  Yes Gail Moore  potassium chloride (K-DUR,KLOR-CON) 10 MEQ tablet Take 10 mEq by mouth 2 (two) times daily. 08/15/18  Yes Provider, Historical, Moore  predniSONE (DELTASONE) 10 MG tablet Take 10mg  daily after Sterapred Taper Patient taking differently: Take 10 mg by mouth daily with  breakfast. Take 10mg  daily after Sterapred Taper 08/23/18  Yes Gail Moore  predniSONE (STERAPRED UNI-PAK 21 TAB) 10 MG (21) TBPK tablet Take 6 tablets day 1, 5 tablets day 2, 4 tablets day 3, 3 tablets day 4, 2 tablets day 5, 1 tablet day 6, and then continue 10 mg tablets daily with home prednisone dosing Patient not taking: Reported on 08/30/2018 08/23/18   Gail Moore    Inpatient Medications: Scheduled Meds: . arformoterol  15 mcg Nebulization Q12H  . budesonide  0.5 mg Nebulization BID  . enoxaparin (LOVENOX) injection  40 mg Subcutaneous Q24H  . famotidine  20 mg Oral QHS  . feeding supplement (ENSURE ENLIVE)  237 mL Oral BID BM  . guaiFENesin  1,200 mg Oral BID  . insulin aspart  0-15 Units Subcutaneous TID WC  . insulin aspart  0-5 Units Subcutaneous QHS  . ipratropium  0.5 mg Nebulization TID  . methylPREDNISolone (SOLU-MEDROL) injection  60 mg Intravenous Q12H  . montelukast  10 mg Oral QHS  . potassium chloride  10 mEq Oral BID   Continuous Infusions: . sodium chloride Stopped (08/31/18 0404)  . ceFEPime (MAXIPIME) IV Stopped (09/01/18 1940)  . metronidazole Stopped (09/02/18 0435)   PRN Meds: sodium chloride, acetaminophen **OR** acetaminophen, benzonatate, HYDROcodone-acetaminophen, HYDROcodone-homatropine, labetalol, levalbuterol, ondansetron **OR** ondansetron (ZOFRAN) IV  Allergies:   No Known Allergies  Social History:   Social History   Socioeconomic History  . Marital status: Single    Spouse name: Not on file  . Number of children: Not on file  . Years of education: Not on file  . Highest education level: Not on file  Occupational History  . Not on file  Social Needs  . Financial resource strain: Not hard at all  . Food insecurity:    Worry: Never true    Inability: Never true  . Transportation needs:    Medical: No    Non-medical: No  Tobacco Use  . Smoking status: Former Smoker    Packs/day: 0.50    Years: 15.00    Pack  years: 7.50    Types: Cigarettes    Last attempt to quit: 12/24/2012    Years since quitting: 5.6  . Smokeless tobacco: Never Used  Substance and Sexual Activity  . Alcohol use: Not Currently    Alcohol/week: 4.0 standard drinks    Types: 4 Cans of beer per week    Comment: per week   . Drug use: Not Currently  . Sexual activity: Not Currently  Lifestyle  . Physical activity:    Days per week: 0 days    Minutes per session: 0 min  . Stress: Not at all  Relationships  . Social connections:    Talks on phone: More than three times a week    Gets together: More than three times a week  Attends religious service: 1 to 4 times per year    Active member of club or organization: No    Attends meetings of clubs or organizations: Never    Relationship status: Never married  . Intimate partner violence:    Fear of current or ex partner: Not on file    Emotionally abused: Not on file    Physically abused: Not on file    Forced sexual activity: Not on file  Other Topics Concern  . Not on file  Social History Narrative  . Not on file    Family History:    Family History  Problem Relation Age of Onset  . Breast cancer Mother   . Breast cancer Father   . Prostate cancer Father      ROS:  Please see the history of present illness.  General:no colds or fevers, no weight changes Skin:no rashes or ulcers HEENT:no blurred vision, no congestion CV:see HPI PUL:see HPI GI:+ diarrhea no constipation or melena, no indigestion GU:no hematuria, no dysuria MS:no joint pain, no claudication Neuro:no syncope, no lightheadedness Endo:no diabetes, no thyroid disease  All other ROS reviewed and negative.     Physical Exam/Data:   Vitals:   09/01/18 1951 09/01/18 2126 09/02/18 0418 09/02/18 0806  BP:  108/63 121/66   Pulse:  (!) 105 97   Resp:  20 (!) 23   Temp:  98.2 F (36.8 C) 97.9 F (36.6 C)   TempSrc:  Oral Oral   SpO2: 100% 98% 96% 93%  Weight:      Height:         Intake/Output Summary (Last 24 hours) at 09/02/2018 1051 Last data filed at 09/02/2018 0600 Gross per 24 hour  Intake 270 ml  Output 700 ml  Net -430 ml   Filed Weights   08/30/18 1801  Weight: 55 kg   Body mass index is 31.53 kg/m.  General:  Well nourished, well developed, in no acute distress but anxious about returning home HEENT: normal Lymph: no adenopathy Neck: no JVD Endocrine:  No thryomegaly Vascular: No carotid bruits; bilateral AKA Cardiac:  normal S1, S2; RRR; no murmur gallup rub or click Lungs:  clear to auscultation bilaterally, no wheezing, rhonchi or rales  Abd: soft, +tenderness, no hepatomegaly, + hernia at site of skin graft + pain to palpation around this site, it does reduce easily  Ext: no edema  AKA bil Musculoskeletal:  Bil AKA, BUE strength normal and equal Skin: warm and dry  Neuro:  Alert and oriented X 3 MAE, follows commands, no focal abnormalities noted Psych:  Normal affect     Relevant CV Studies: No studies  Laboratory Data:  Chemistry Recent Labs  Lab 08/31/18 0546 09/01/18 0617 09/02/18 0622  NA 138 138 138  K 3.4* 3.5 3.2*  CL 104 103 104  CO2 25 22 23   GLUCOSE 116* 88 219*  BUN 8 9 15   CREATININE 0.35* 0.44 0.52  CALCIUM 7.6* 8.0* 8.2*  GFRNONAA >60 >60 >60  GFRAA >60 >60 >60  ANIONGAP 9 13 11     Recent Labs  Lab 08/30/18 1653 08/31/18 0546  PROT 5.5* 5.3*  ALBUMIN 1.9* 1.9*  AST 27 16  ALT 23 20  ALKPHOS 132* 114  BILITOT 1.1 0.6   Hematology Recent Labs  Lab 08/31/18 0546 09/01/18 0617 09/02/18 0622  WBC 13.5* 16.1* 16.5*  RBC 3.07* 2.93* 2.94*  HGB 9.0* 8.5* 8.6*  HCT 27.6* 25.9* 26.1*  MCV 89.9 88.4 88.8  MCH  29.3 29.0 29.3  MCHC 32.6 32.8 33.0  RDW 18.8* 18.9* 19.1*  PLT 142* 152 162   Cardiac EnzymesNo results for input(s): TROPONINI in the last 168 hours. No results for input(s): TROPIPOC in the last 168 hours.  BNPNo results for input(s): BNP, PROBNP in the last 168 hours.  DDimer No  results for input(s): DDIMER in the last 168 hours.  Radiology/Studies:  Dg Chest 2 View  Result Date: 08/30/2018 CLINICAL DATA:  Fever and weakness. EXAM: CHEST - 2 VIEW COMPARISON:  08/22/2018 FINDINGS: Stable appearance of right suprahilar soft tissue mass. Geometric opacity in the right lower hemithorax likely corresponds to previously demonstrated right middle and right lower lobe collapse. There is a superimposed right pleural effusion. Osseous structures are without acute abnormality. Soft tissues are grossly normal. IMPRESSION: Stable appearance of large right suprahilar soft tissue mass. Geometric opacity in the right lower hemithorax likely corresponds to previously demonstrated right middle and right lower lobe collapse. There is a superimposed right pleural effusion. Electronically Signed   By: Fidela Salisbury M.D.   On: 08/30/2018 18:09   Dg Chest Port 1 View  Result Date: 09/01/2018 CLINICAL DATA:  Hemoptysis. EXAM: PORTABLE CHEST 1 VIEW COMPARISON:  Radiograph of August 30, 2018. CT scan of August 16, 2018. FINDINGS: Stable appearance of large right suprahilar mass. Left lung is clear. Stable atelectasis of right lower lobe is noted with possible associated pleural effusion. Bony thorax is unremarkable. No pneumothorax is noted. IMPRESSION: Stable appearance of large right suprahilar mass is noted with stable atelectasis of right lower lobe with possible associated pleural effusion, most likely postobstructive in etiology. Electronically Signed   By: Marijo Conception, M.D.   On: 09/01/2018 07:49    Assessment and Plan:   1. SVT in pt with moderate malnutrition due to lung cancer.  No hx of CAD - this may be due to malnutrition and electrolyte imbalance in combination to pulmonary failure and sepsis.  She does have at home several times per day.   Will check echo.  Dr. Meda Moore to see.  Has had prn IV BB.  I believe atrial tach, rates up to 180.  possibility of a fib will have Dr. Meda Moore  to review. 2. Hypokalemia and hypo magnesium   3. COPD per IM 4. HCAP with sepsis.  5. abd pain to palpation around site of skin graft.    For questions or updates, please contact [place] Please consult [email or web address] for contact info under   Signed, Cecilie Kicks, NP  09/02/2018 10:51 AM   The patient was seen, examined and discussed with Cecilie Kicks, NP and I agree with the above.   Gail Moore is an unfortunate 67 y.o. female with a hx of small cell CA of lung treated with radiation, PAD with hx Bil AKA, COPD now admitted with HCAP, acute hypoxic respiratory failure, no prior cardiac history, being evaluated for sinus tachycardia and SVT.  On IV antibiotics. She has sepsis, protein calorie malnutrition, hypomagnesimia After admit she had run of SVT to 180 may be slightly irregular she has had several episodes. The only one present on telemetry lasting 6 beats. She states that at home they can last seconds to minutes was associated with some dizziness. She is aware of them and has had this happen several times per day.  She has had brief chest pain at times with this.  She is not active as she has B/L AKA amputation.  On physical exam she has elevated  JVDs, decreased BS on the right, crackles at bases.  Crea 0.5, K 3.2, Hb 8.6, TSH 1.1. ECG shows  Echocardiogram is pending.  A/P: SVT Acute CHF, unknown LVEF:  Start lasix 20 mg iv BID, we will re-evaluate in the am Start Cardizem SR 60 mg po BID, if abnormal LVEF, we will switch to BB  Ena Dawley, Moore 09/02/2018

## 2018-09-02 NOTE — Progress Notes (Addendum)
OT Cancellation Note  Patient Details Name: Gail Moore MRN: [id number] DOB: May 16, 1951   Cancelled Treatment:    Reason Eval/Treat Not Completed: Fatigue/lethargy limiting ability to participate. Pt had XRT this am and feels totally exhausted  Dhani Imel 09/02/2018, 1:52 PM  Lesle Chris, OTR/L [phone] 09/02/2018

## 2018-09-02 NOTE — Progress Notes (Signed)
Pharmacy Antibiotic Note  Gail Moore is a 67 y.o. female admitted on 08/30/2018 with HCAPwith possible postobstructive PNA.  Pharmacy has been consulted for cefepime dosing.  09/02/2018 Afebrile WBC 16.5, on solu-medrol Scr 0.52, CrCl ~ 41.89mls/min   Plan:  Continue cefepime 2gm IV q24h  Metronidazole 500 mg IV q8h per MD  Monitor clinical course, renal function, cultures as available   Height: 4\' 4"  (132.1 cm) Weight: 121 lb 4.1 oz (55 kg) IBW/kg (Calculated) : 27.1  Temp (24hrs), Avg:98.1 F (36.7 C), Min:97.9 F (36.6 C), Max:98.2 F (36.8 C)  Recent Labs  Lab 08/30/18 1653 08/30/18 1807 08/31/18 0546 09/01/18 0617 09/01/18 0845 09/02/18 0622  WBC 17.6*  --  13.5* 16.1*  --  16.5*  CREATININE 0.63  --  0.35* 0.44  --  0.52  LATICACIDVEN  --  1.10  --  0.9 1.1  --     Estimated Creatinine Clearance: 41.3 mL/min (by C-G formula based on SCr of 0.52 mg/dL).    No Known Allergies  Antimicrobials this admission: 9/7 metronidazole >>  9/7 cefepime >>  9/7 vancomycin >>   Dose adjustments this admission:   Microbiology results: 9/7 BCx: NGTD   Thank you for allowing pharmacy to be a part of this patient's care.   Dolly Rias RPh 09/02/2018, 1:22 PM Pager [phone]

## 2018-09-02 NOTE — Progress Notes (Signed)
Modified Barium Swallow Progress Note  Patient Details  Name: Gail Moore MRN: [id number] Date of Birth: 26-Feb-1951  Today's Date: 09/02/2018  Modified Barium Swallow completed.  Full report located under Chart Review in the Imaging Section.  Brief recommendations include the following:  Clinical Impression  Patient presents with functional oropharyngeal swallow ability.  NO aspiration and only trace laryngeal penetration of thin cleared within the same swallow.  Pharyngeal swallow is strong without residuals.  Please note, pt coughed during evaluation without evidence of barium in larynx.  She was not ideally positioned due to her wound and reports she is eating with HOB reclined.  Upon esophageal sweep, esophagus appeared clear.  Barium tablet appeared to readily clear esophagus also.  Educated pt to findings/recommendations using teach back.  SLP will follow up to provide respiratory muscle strength training to faciliate strength of cough.  Pt agreeable to plan.     Swallow Evaluation Recommendations       SLP Diet Recommendations: Regular solids;Thin liquid   Liquid Administration via: Straw;Cup   Medication Administration: (as tolerated)   Supervision: Patient able to self feed   Compensations: Slow rate;Small sips/bites(drink liquids during meal)   Postural Changes: Remain semi-upright after after feeds/meals (Comment);Seated upright at 90 degrees   Oral Care Recommendations: Oral care BID      Luanna Salk, [facility] SLP [phone]   Macario Golds 09/02/2018,12:48 PM

## 2018-09-02 NOTE — Progress Notes (Signed)
PROGRESS NOTE  Gail Moore ONG:[id number] DOB: June 21, 1951 DOA: 08/30/2018 PCP: Marco Collie, MD  HPI/Recap of past 24 hours: Gail Moore is a 67 y.o. 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 Presented with fever up to 103 at home, cough and shortness of breath.  Recently was admitted with similar presentation and discharged on 31 August during her past admission patient was diagnosed with postobstructive pneumonia secondary to lung cancer patient was transferred to [facility] hospitalist time and started radiation for therapy was able to transition off of IV antibiotics patient was evaluated by PT OT and recommended home health PT. admitted for H CAP.  08/31/2018: Patient seen and examined at bedside.  She reports generalized weakness and fatigue.  Denies hemoptysis.  She has intermittent cough.  Denies dyspnea at rest.  09/01/2018: Patient seen and examined at her bedside.  Reports hemoptysis last night about a cupful.  Dyspnea is improving however with her breathing treatments.  Remains tachycardic.  Last twelve-lead EKG revealed sinus tachycardia with rate of 112 with no specific ST-T changes.  Not on any AV nodal blockade medications.  Will consult cardiology.  Denies chest pain and admits to mild palpitations.  09/02/18: Feels fatigued with generalized weakness.  Assessment/Plan: Active Problems:   HCAP (healthcare-associated pneumonia)   Lung cancer ([place])   Acute respiratory failure with hypoxia (HCC)   Hypokalemia   Sepsis ([place])   Protein-calorie malnutrition, severe   Hypomagnesemia   Anemia   Hyperglycemia   Pleural effusion   S/P AKA (above knee amputation) bilateral (HCC)   Malnutrition of moderate degree   Pressure injury of skin  . HCAP (healthcare-associated pneumonia) persistent postobstructive C/w a antibiotics Breathing tx procalcitonin  MRSA screening negative.  DC IV vancomycin.  Procalcitonin  0.86.  Continue to monitor fever curve, WBC.  Continue IV antibiotics.   . Acute respiratory failure with hypoxia (HCC) likely secondary to postobstructive pneumonia and right-sided lobular lung collapse.  Independently reviewed chest x-ray done on admission which revealed right lung mass.  No specific lobular infiltrates noted.  Continue to supplement with oxygen as needed.  Continue breathing treatments. . Resolved hypokalemia we will replace and monitor on telemetry.  Repleted.  Repeat BMP in the morning . Lung cancer [facility]) -admitting physician discussed with oncology on-call patient's primary oncologist does not round at [facility].  Patient will follow-up as an outpatient if patient is still admitted on Monday  could discuss with primary oncologist  goals of care overall and plan of management given recurrent admissions and worsening functional status . Protein-calorie malnutrition, severe - Albumin 1.9.  Continue oral supplement.  Encourage increase protein calorie intake. . Sepsis ([place]) source being possibly postobstructive pneumonia will check UA and check for C. difficile.  For now continue IV antibiotics obtain blood cultures and sputum cultures other source of fever could be malignancy itself Generalized weakness: PT to assess, fall precautions.  OT recommends home health OT. [name] Hypomagnesemia-repleted  . Anemia of chronic disease:  Hemoglobin stable at 8.5.  No sign of overt bleeding. . Steroid-induced hyperglycemia hemoglobin A1c 7.4; insulin sliding scale  . Pleural effusion -Per review of prior CT suspect large portion of a shadow consistent of collapsed lung although  pleural effusion present in the past did not seem to worsen COPD possibly with acute exacerbation-patient has worsening wheezing when off of steroids but also have noted to have elevated blood sugars.  Suspect partially wheezing could  be secondary to obstructive lung mass.  But patient was doing better on higher dose of  steroids for now we will increase and see if respiratory status would improve increase prednisone to 40-day and observe continue home inhalers schedule Atrovent and as needed Xopenex order flutter valve.  Start IV Solu-Medrol 60 mg twice daily.  Hold p.o. steroid. Resolved accelerated hypertension: Continue home antihypertensive medications.  Continue labetalol IV 10 mg as needed for systolic blood pressure greater than 607 or diastolic blood pressure greater than 105. Sinus tachycardia: Heart rate ranging between 110 and 120.  Not on any rate controlled/AV nodal blockade medications.  Continue IV Lopressor as needed.  Continue to monitor on telemetry.  Consult cardiology.  Code Status: DNR  Family Communication: None at bedside  Disposition Plan: Home with home health PT and OT versus SNF   Consultants:  Cardiology  Procedures:  None  Antimicrobials:  IV cefepime and IV Flagyl  DVT prophylaxis: Subcu Lovenox daily   Objective: Vitals:   09/02/18 2039 09/02/18 2113 09/02/18 2139 09/02/18 2156  BP: 118/74   120/66  Pulse: (!) 101     Resp: 19     Temp: 98.1 F (36.7 C)     TempSrc: Oral     SpO2: 98% 91% 100%   Weight:      Height:        Intake/Output Summary (Last 24 hours) at 09/02/2018 2232 Last data filed at 09/02/2018 2158 Gross per 24 hour  Intake 520 ml  Output 400 ml  Net 120 ml   Filed Weights   08/30/18 1801  Weight: 55 kg    Exam:  . General: 67 y.o. year-old female WD WN A&O x 3.  . Cardiovascular: RRR no rubs or gallops.. . Respiratory: Mild rales at bases.  No wheezes.. Good inspiratory effort. . Abdomen: Soft nontender nondistended with normal bowel sounds x4 quadrants. . Musculoskeletal: No lower extremity edema.  Bilateral above-the-knee amputation. . Skin: No ulcerative lesions noted or rashes, . Psychiatry: Mood is appropriate for condition and setting   Data Reviewed: CBC: Recent Labs  Lab 08/30/18 1653 08/31/18 0546  09/01/18 0617 09/02/18 0622  WBC 17.6* 13.5* 16.1* 16.5*  HGB 9.2* 9.0* 8.5* 8.6*  HCT 28.1* 27.6* 25.9* 26.1*  MCV 88.9 89.9 88.4 88.8  PLT 150 142* 152 371   Basic Metabolic Panel: Recent Labs  Lab 08/30/18 1653 08/31/18 0546 09/01/18 0617 09/02/18 0622  NA 139 138 138 138  K 2.8* 3.4* 3.5 3.2*  CL 101 104 103 104  CO2 26 25 22 23   GLUCOSE 270* 116* 88 219*  BUN 6* 8 9 15   CREATININE 0.63 0.35* 0.44 0.52  CALCIUM 7.7* 7.6* 8.0* 8.2*  MG 1.4* 1.9  --  1.6*  PHOS 3.2 2.8  --   --    GFR: Estimated Creatinine Clearance: 41.3 mL/min (by C-G formula based on SCr of 0.52 mg/dL). Liver Function Tests: Recent Labs  Lab 08/30/18 1653 08/31/18 0546  AST 27 16  ALT 23 20  ALKPHOS 132* 114  BILITOT 1.1 0.6  PROT 5.5* 5.3*  ALBUMIN 1.9* 1.9*   No results for input(s): LIPASE, AMYLASE in the last 168 hours. No results for input(s): AMMONIA in the last 168 hours. Coagulation Profile: No results for input(s): INR, PROTIME in the last 168 hours. Cardiac Enzymes: No results for input(s): CKTOTAL, CKMB, CKMBINDEX, TROPONINI in the last 168 hours. BNP (last 3 results) No results for input(s): PROBNP in the last 8760 hours.  HbA1C: Recent Labs    08/31/18 0546  HGBA1C 7.4*   CBG: Recent Labs  Lab 09/01/18 1624 09/01/18 2023 09/02/18 0754 09/02/18 1632 09/02/18 1952  GLUCAP 207* 203* 214* 223* 191*   Lipid Profile: No results for input(s): CHOL, HDL, LDLCALC, TRIG, CHOLHDL, LDLDIRECT in the last 72 hours. Thyroid Function Tests: Recent Labs    08/31/18 0811  TSH 1.145   Anemia Panel: No results for input(s): VITAMINB12, FOLATE, FERRITIN, TIBC, IRON, RETICCTPCT in the last 72 hours. Urine analysis:    Component Value Date/Time   COLORURINE AMBER (A) 08/30/2018 1631   APPEARANCEUR HAZY (A) 08/30/2018 1631   LABSPEC 1.032 (H) 08/30/2018 1631   PHURINE 5.0 08/30/2018 1631   GLUCOSEU NEGATIVE 08/30/2018 1631   HGBUR NEGATIVE 08/30/2018 1631   BILIRUBINUR  SMALL (A) 08/30/2018 1631   KETONESUR 5 (A) 08/30/2018 1631   PROTEINUR 100 (A) 08/30/2018 1631   NITRITE NEGATIVE 08/30/2018 1631   LEUKOCYTESUR NEGATIVE 08/30/2018 1631   Sepsis Labs: @LABRCNTIP (procalcitonin:4,lacticidven:4)  ) Recent Results (from the past 240 hour(s))  Urine culture     Status: None   Collection Time: 08/30/18  5:18 PM  Result Value Ref Range Status   Specimen Description   Final    URINE, CATHETERIZED Performed at [facility], [place] [address], [place], [place] [address]    Special Requests   Final    NONE Performed at [facility], [place] [address] [place], [place] [address]    Culture   Final    NO GROWTH Performed at [facility], [place] [address], [place], [place] [address]    Report Status 09/01/2018 FINAL  Final  Culture, blood (routine x 2)     Status: None (Preliminary result)   Collection Time: 08/30/18  5:55 PM  Result Value Ref Range Status   Specimen Description   Final    BLOOD LEFT HAND Performed at [facility], [place] [address] [place], [place] [address]    Special Requests   Final    BOTTLES DRAWN AEROBIC AND ANAEROBIC Blood Culture results may not be optimal due to an excessive volume of blood received in culture bottles Performed at [place] [address] [place], [place] [address]    Culture   Final    NO GROWTH 2 DAYS Performed at [place] [address] [place], [place] [address]    Report Status PENDING  Incomplete  Culture, blood (routine x 2)     Status: None (Preliminary result)   Collection Time: 08/30/18  6:00 PM  Result Value Ref Range Status   Specimen Description   Final    BLOOD RIGHT FOREARM Performed at [facility], [place] [address] [place], [place] [address]    Special Requests   Final    BOTTLES DRAWN AEROBIC AND ANAEROBIC Blood Culture results may not be optimal due to an excessive volume of blood received in culture  bottles Performed at [place] [address], [place], [place] [address]    Culture   Final    NO GROWTH 2 DAYS Performed at [place] [address] [place],  [address]    Report Status PENDING  Incomplete  MRSA PCR Screening     Status: None   Collection Time: 08/31/18  9:47 AM  Result Value Ref Range Status   MRSA by PCR NEGATIVE NEGATIVE Final    Comment:  The GeneXpert MRSA Assay (FDA approved for NASAL specimens only), is one component of a comprehensive MRSA colonization surveillance program. It is not intended to diagnose MRSA infection nor to guide or monitor treatment for MRSA infections. Performed at [facility], [place] [address] [place], [place] [address]       Studies: Dg Swallowing Func-speech Pathology  Result Date: 09/02/2018 Objective Swallowing Evaluation: Type of Study: MBS-Modified Barium Swallow Study  Patient Details Name: Gail Moore MRN: [id number] Date of Birth: Jun 04, 1951 Today's Date: 09/02/2018 Time: SLP Start Time (ACUTE ONLY): 1220 -SLP Stop Time (ACUTE ONLY): 1239 SLP Time Calculation (min) (ACUTE ONLY): 19 min Past Medical History: Past Medical History: Diagnosis Date . Asthma  . Peripheral vascular disease [facility])  Past Surgical History: Past Surgical History: Procedure Laterality Date . ABOVE KNEE LEG AMPUTATION Bilateral  . APPENDECTOMY   . COLON SURGERY   . VIDEO BRONCHOSCOPY Bilateral 07/31/2018  Procedure: VIDEO BRONCHOSCOPY WITHOUT FLUORO;  Surgeon: Tanda Rockers, MD;  Location: [facility];  Service: Cardiopulmonary;  Laterality: Bilateral; HPI: 67 y.o. 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  Pt with diagnosis of PNA last admitted in late August and is now back with fever  Subjective: pt awake in chair Assessment / Plan / Recommendation CHL IP CLINICAL IMPRESSIONS  09/02/2018 Clinical Impression Patient presents with functional oropharyngeal swallow ability.  NO aspiration and only trace laryngeal penetration of thin cleared within the same swallow.  Pharyngeal swallow is strong without residuals.  Please note, pt coughed during evaluation without evidence of barium in larynx.  She was not ideally positioned due to her wound and reports she is eating with HOB reclined.  Upon esophageal sweep, esophagus appeared clear.  Barium tablet appeared to readily clear esophagus also.  Educated pt to findings/recommendations using teach back.  SLP will follow up to provide respiratory muscle strength training to faciliate strength of cough.  Pt agreeable to plan.   SLP Visit Diagnosis Dysphagia, unspecified (R13.10) Attention and concentration deficit following -- Frontal lobe and executive function deficit following -- Impact on safety and function Mild aspiration risk   CHL IP TREATMENT RECOMMENDATION 09/02/2018 Treatment Recommendations Therapy as outlined in treatment plan below   Prognosis 09/02/2018 Prognosis for Safe Diet Advancement Good Barriers to Reach Goals -- Barriers/Prognosis Comment -- CHL IP DIET RECOMMENDATION 09/02/2018 SLP Diet Recommendations Regular solids;Thin liquid Liquid Administration via Straw;Cup Medication Administration (No Data) Compensations Slow rate;Small sips/bites Postural Changes Remain semi-upright after after feeds/meals (Comment);Seated upright at 90 degrees   CHL IP OTHER RECOMMENDATIONS 09/02/2018 Recommended Consults -- Oral Care Recommendations Oral care BID Other Recommendations --   CHL IP FOLLOW UP RECOMMENDATIONS 09/01/2018 Follow up Recommendations (No Data)   CHL IP FREQUENCY AND DURATION 09/02/2018 Speech Therapy Frequency (ACUTE ONLY) min 1 x/week Treatment Duration 1 week      CHL IP ORAL PHASE 09/02/2018 Oral Phase Impaired Oral - Pudding Teaspoon -- Oral - Pudding Cup -- Oral - Honey Teaspoon -- Oral - Honey Cup -- Oral - Nectar Teaspoon --  Oral - Nectar Cup WFL Oral - Nectar Straw -- Oral - Thin Teaspoon WFL Oral - Thin Cup WFL Oral - Thin Straw WFL Oral - Puree WFL Oral - Mech Soft WFL Oral - Regular -- Oral - Multi-Consistency -- Oral - Pill WFL Oral Phase - Comment --  CHL IP PHARYNGEAL PHASE 09/02/2018 Pharyngeal  Phase Impaired Pharyngeal- Pudding Teaspoon -- Pharyngeal -- Pharyngeal- Pudding Cup -- Pharyngeal -- Pharyngeal- Honey Teaspoon -- Pharyngeal -- Pharyngeal- Honey Cup -- Pharyngeal -- Pharyngeal- Nectar Teaspoon -- Pharyngeal -- Pharyngeal- Nectar Cup WFL Pharyngeal -- Pharyngeal- Nectar Straw -- Pharyngeal -- Pharyngeal- Thin Teaspoon WFL Pharyngeal -- Pharyngeal- Thin Cup Penetration/Aspiration during swallow Pharyngeal Material enters airway, remains ABOVE vocal cords then ejected out Pharyngeal- Thin Straw Penetration/Aspiration during swallow Pharyngeal Material enters airway, remains ABOVE vocal cords then ejected out Pharyngeal- Puree WFL;Delayed swallow initiation-vallecula Pharyngeal -- Pharyngeal- Mechanical Soft -- Pharyngeal -- Pharyngeal- Regular WFL Pharyngeal -- Pharyngeal- Multi-consistency -- Pharyngeal -- Pharyngeal- Pill WFL Pharyngeal -- Pharyngeal Comment --  CHL IP CERVICAL ESOPHAGEAL PHASE 09/02/2018 Cervical Esophageal Phase WFL Pudding Teaspoon -- Pudding Cup -- Honey Teaspoon -- Honey Cup -- Nectar Teaspoon -- Nectar Cup -- Nectar Straw -- Thin Teaspoon -- Thin Cup -- Thin Straw -- Puree -- Mechanical Soft -- Regular -- Multi-consistency -- Pill -- Cervical Esophageal Comment -- Macario Golds 09/02/2018, 2:07 PM  Luanna Salk, MS [facility] SLP [phone]              Scheduled Meds: . arformoterol  15 mcg Nebulization Q12H  . budesonide  0.5 mg Nebulization BID  . diltiazem  60 mg Oral Q12H  . enoxaparin (LOVENOX) injection  40 mg Subcutaneous Q24H  . famotidine  20 mg Oral QHS  . feeding supplement (ENSURE ENLIVE)  237 mL Oral BID BM  . furosemide  20 mg Intravenous BID  . guaiFENesin  1,200 mg Oral BID   . insulin aspart  0-15 Units Subcutaneous TID WC  . insulin aspart  0-5 Units Subcutaneous QHS  . montelukast  10 mg Oral QHS  . potassium chloride  10 mEq Oral BID    Continuous Infusions: . sodium chloride Stopped (08/31/18 0404)  . ceFEPime (MAXIPIME) IV 2 g (09/02/18 1647)  . metronidazole Stopped (09/02/18 2030)     LOS: 3 days     Kayleen Memos, MD [facility] Pager [phone]  If 7PM-7AM, please contact night-coverage [email or web address] Password [facility] 09/02/2018, 10:32 PM

## 2018-09-03 ENCOUNTER — Ambulatory Visit
Admission: RE | Admit: 2018-09-03 | Discharge: 2018-09-03 | Disposition: A | Discharge status: Home / Self Care | Payer: Medicare Other | Source: Ambulatory Visit | Attending: Radiation Oncology | Admitting: Radiation Oncology

## 2018-09-03 ENCOUNTER — Inpatient Hospital Stay (HOSPITAL_COMMUNITY): Payer: Medicare Other

## 2018-09-03 DIAGNOSIS — I5030 Unspecified diastolic (congestive) heart failure: Secondary | ICD-10-CM

## 2018-09-03 DIAGNOSIS — I471 Supraventricular tachycardia: Secondary | ICD-10-CM

## 2018-09-03 LAB — BASIC METABOLIC PANEL
Anion gap: 11 (ref 5–15)
BUN: 14 mg/dL (ref 8–23)
CO2: 22 mmol/L (ref 22–32)
CREATININE: 0.48 mg/dL (ref 0.44–1.00)
Calcium: 8 mg/dL — ABNORMAL LOW (ref 8.9–10.3)
Chloride: 106 mmol/L (ref 98–111)
GFR calc non Af Amer: >60 mL/min (ref >60)
Glucose, Bld: 117 mg/dL — ABNORMAL HIGH (ref 70–99)
POTASSIUM: 3.2 mmol/L — AB (ref 3.5–5.1)
SODIUM: 139 mmol/L (ref 135–145)

## 2018-09-03 LAB — CBC
HCT: 26.5 % — ABNORMAL LOW (ref 36.0–46.0)
Hemoglobin: 8.5 g/dL — ABNORMAL LOW (ref 12.0–15.0)
MCH: 28.7 pg (ref 26.0–34.0)
MCHC: 32.1 g/dL (ref 30.0–36.0)
MCV: 89.5 fL (ref 78.0–100.0)
Platelets: 144 10*3/uL — ABNORMAL LOW (ref 150–400)
RBC: 2.96 MIL/uL — AB (ref 3.87–5.11)
RDW: 19.5 % — ABNORMAL HIGH (ref 11.5–15.5)
WBC: 12.4 10*3/uL — ABNORMAL HIGH (ref 4.0–10.5)

## 2018-09-03 LAB — MAGNESIUM: Magnesium: 2.2 mg/dL (ref 1.7–2.4)

## 2018-09-03 LAB — ECHOCARDIOGRAM COMPLETE
Height: 52 in
WEIGHTICAEL: 1940.05 [oz_av]

## 2018-09-03 LAB — GLUCOSE, CAPILLARY
Glucose-Capillary: 123 mg/dL — ABNORMAL HIGH (ref 70–99)
Glucose-Capillary: 111 mg/dL — ABNORMAL HIGH (ref 70–99)
Glucose-Capillary: 93 mg/dL (ref 70–99)

## 2018-09-03 MED ORDER — DILTIAZEM HCL ER 60 MG PO CP12: 60.0000 mg | ORAL_CAPSULE | Freq: Two times a day (BID) | ORAL | 0 refills | Status: AC

## 2018-09-03 MED ORDER — NYSTATIN 100000 UNIT/ML MT SUSP: 5.0000 mL | Freq: Four times a day (QID) | OROMUCOSAL | 0 refills | Status: AC

## 2018-09-03 MED ORDER — FAMOTIDINE 20 MG PO TABS: 20.0000 mg | ORAL_TABLET | Freq: Every day | ORAL | 0 refills | Status: AC

## 2018-09-03 MED ORDER — CEPHALEXIN 500 MG PO CAPS: 500.0000 mg | ORAL_CAPSULE | Freq: Two times a day (BID) | ORAL | 0 refills | Status: AC

## 2018-09-03 MED ORDER — POTASSIUM CHLORIDE CRYS ER 20 MEQ PO TBCR: 40.0000 meq | EXTENDED_RELEASE_TABLET | Freq: Two times a day (BID) | ORAL | Status: DC

## 2018-09-03 MED ORDER — GUAIFENESIN ER 600 MG PO TB12: 1200.0000 mg | ORAL_TABLET | Freq: Two times a day (BID) | ORAL | 0 refills | Status: AC

## 2018-09-03 MED ORDER — ARFORMOTEROL TARTRATE 15 MCG/2ML IN NEBU: 15.0000 ug | INHALATION_SOLUTION | Freq: Two times a day (BID) | RESPIRATORY_TRACT | 0 refills | Status: AC

## 2018-09-03 MED ORDER — LEVALBUTEROL HCL 0.63 MG/3ML IN NEBU: 0.6300 mg | INHALATION_SOLUTION | RESPIRATORY_TRACT | 0 refills | Status: AC | PRN

## 2018-09-03 MED ORDER — NYSTATIN 100000 UNIT/ML MT SUSP
5.0000 mL | Freq: Four times a day (QID) | OROMUCOSAL | Status: DC
  Administered 2018-09-03: 500000 [IU] via ORAL
  Filled 2018-09-03: qty 5

## 2018-09-03 MED ORDER — BUDESONIDE 0.5 MG/2ML IN SUSP: 0.5000 mg | Freq: Two times a day (BID) | RESPIRATORY_TRACT | 0 refills | Status: AC

## 2018-09-03 MED ORDER — CEPHALEXIN 500 MG PO CAPS: 500.0000 mg | ORAL_CAPSULE | Freq: Two times a day (BID) | ORAL | Status: DC

## 2018-09-03 MED ORDER — CEPHALEXIN 500 MG PO CAPS
500.0000 mg | ORAL_CAPSULE | Freq: Two times a day (BID) | ORAL | Status: DC
  Administered 2018-09-03: 500 mg via ORAL
  Filled 2018-09-03: qty 1

## 2018-09-03 NOTE — Progress Notes (Signed)
SLP Cancellation Note  Patient Details Name: Gail Moore MRN: [id number] DOB: 1950/12/31   Cancelled treatment:       Reason Eval/Treat Not Completed: Patient declined, no reason specified;Fatigue/lethargy limiting ability to participate(pt declined to participate, sister present)   Note pt possibly to dc today, please order follow [facility] SLP for respiratory muscle strength training to aid airway protection.  Thanks.   Macario Golds 09/03/2018, 1:42 PM  Luanna Salk, [place] [facility] SLP [phone]

## 2018-09-03 NOTE — Discharge Instructions (Signed)
Hemoptysis Hemoptysis is when you cough up blood. It can be mild or serious. If it is mild, you may cough up bloody spit and mucus (sputum). If you cough up 1-2 cups (240-480 mL) of blood within 24 hours (massive hemoptysis), it is an emergency. If you cough up blood, it is important to go and see your doctor. Follow these instructions at home:  Watch your condition for any changes.  Take over-the-counter and prescription medicines only as told by your doctor.  If you were prescribed an antibiotic medicine, take it as told by your doctor. Do not stop taking the antibiotic even if you start to feel better.  Go back to your normal activities as told by your doctor. Ask your doctor what activities are safe for you to do.  Do not use any products that contain nicotine or tobacco. These include cigarettes and e-cigarettes. If you need help quitting, ask your doctor.  Keep all follow-up visits as told by your doctor. This is important. Contact a doctor if:  You have a fever.  You cough up bloody spit and mucus. Get help right away if:  You cough up fresh blood or blood clots.  You have trouble breathing.  You have chest pain. This information is not intended to replace advice given to you by your health care provider. Make sure you discuss any questions you have with your health care provider. Document Released: 11/26/2012 Document Revised: 09/07/2016 Document Reviewed: 09/07/2016 Elsevier Interactive Patient Education  2018 [place] Pneumonia Healthcare-associated pneumonia is a lung infection that a person can get when in a health care setting or during certain procedures. The infection causes air sacs inside the lungs to fill with pus or fluid. Healthcare-associated pneumonia is usually caused by bacteria that are common in health care settings. These bacteria may be resistant to some antibiotic medicines. What are the causes? This condition is caused  by bacteria that get into your lungs. You can get this condition if you:  Breathe in droplets from an infected person's cough or sneeze.  Touch something that an infected person coughed or sneezed on and then touch your mouth, nose, or eyes.  Have a bacterial infection somewhere else in your body, if the bacteria spread to your lungs through your blood.  What increases the risk? This condition is more likely to develop in people who:  Have a disease that weakens their body's defense system (immune system) or their ability to cough out germs.  Are older than age 18.  Having trouble swallowing.  Use a feeding or breathing tube.  Have a cold or the flu.  Have an IV tube inserted in a vein.  Have surgery.  Have a bed sore.  Live in a long-term care facility, such as a nursing home.  Were in the hospital for two or more days in the past 3 months.  Received hemodialysis in the past 30 days.  What are the signs or symptoms? Symptoms of this condition include:  Fever.  Chills.  Cough.  Shortness of breath.  Wheezing or crackling sounds when breathing.  How is this diagnosed? This condition may be diagnosed based on:  Your symptoms.  A chest X-ray.  A measurement of the amount of oxygen in your blood.  How is this treated? This condition is treated with antibiotics. Your health care provider may take a sample of cells (culture) from your throat to determine what type of bacteria is in your lungs and change  your antibiotic based on the results. If you have bacteria in your blood, trouble breathing, or a low oxygen level, you may need to be treated at the hospital. At the hospital, you will be given antibiotics through an IV tube. You may also be given oxygen or breathing treatments. Follow these instructions at home: Medicine  Take your antibiotic medicine as told by your health care provider. Do not stop taking the antibiotic even if you start to feel  better.  Take over-the-counter and other prescription medicines only as told by your health care provider. Activity  Rest at home until you feel better.  Return to your normal activities as told by your health care provider. Ask your health care provider what activities are safe for you. General instructions  Drink enough fluid to keep your urine clear or pale yellow.  Do not use any products that contain nicotine or tobacco, such as cigarettes and e-cigarettes. If you need help quitting, ask your health care provider.  Limit alcohol intake to no more than 1 drink per day for nonpregnant women and 2 drinks per day for men. One drink equals 12 oz of beer, 5 oz of wine, or 1 oz of hard liquor.  Keep all follow-up visits as told by your health care provider. This is important. How is this prevented? Actions that I can take To lower your risk of getting this condition again:  Do not smoke. This includes e-cigarettes.  Do not drink too much alcohol.  Keep your immune system healthy by eating well and getting enough sleep.  Get a flu shot every year (annually).  Get a pneumonia vaccination if: ? You are older than age 31. ? You smoke. ? You have a long-lasting condition like lung disease.  Exercise your lungs by taking deep breaths, walking, and using an incentive spirometer as directed.  Wash your hands often with soap and water. If you cannot get to a sink to wash your hands, use an alcohol-based hand cleaner.  Make sure your health care providers are washing their hands. If you do not see them wash their hands, ask them to do so.  When you are in a health care facility, avoid touching your eyes, nose, and mouth.  Avoid touching any surface near where people have coughed or sneezed.  Stand away from sick people when they are coughing or sneezing.  Wear a mask if you cannot avoid exposure to people who are sick.  Clean all surfaces often with a disinfectant cleaner,  especially if someone is sick at home or work.  Precautions of my health care team Hospitals, nursing homes, and other health care facilities take special care to try to prevent healthcare-associated pneumonia. To do this, your health care team may:  Clean their hands with soap and water or with alcohol-based hand sanitizer before and after seeing patients.  Wear gloves or masks during treatment.  Sanitize medical instruments, tubes, other equipment, and surfaces in patient rooms.  Raise (elevate) the head of your hospital bed so you are not lying flat. The head of the bed may be elevated 30 degrees or more.  Have you sit up and move around as soon as possible after surgery.  Only insert a breathing tube if needed.  Do these things for you if you have a breathing tube: ? Clean the inside of your mouth regularly. ? Remove the breathing tube as soon as it is no longer needed.  Contact a health care provider if:  Your symptoms do not get better or they get worse.  Your symptoms come back after you have finished taking your antibiotics. Get help right away if:  You have trouble breathing.  You have confusion or difficulty thinking. This information is not intended to replace advice given to you by your health care provider. Make sure you discuss any questions you have with your health care provider. Document Released: 05/01/2016 Document Revised: 09/25/2016 Document Reviewed: 09/07/2016 Elsevier Interactive Patient Education  2018 [place].   Cough, Adult A cough helps to clear your throat and lungs. A cough may last only 2-3 weeks (acute), or it may last longer than 8 weeks (chronic). Many different things can cause a cough. A cough may be a sign of an illness or another medical condition. Follow these instructions at home:  Pay attention to any changes in your cough.  Take medicines only as told by your doctor. ? If you were prescribed an antibiotic medicine, take it as  told by your doctor. Do not stop taking it even if you start to feel better. ? Talk with your doctor before you try using a cough medicine.  Drink enough fluid to keep your pee (urine) clear or pale yellow.  If the air is dry, use a cold steam vaporizer or humidifier in your home.  Stay away from things that make you cough at work or at home.  If your cough is worse at night, try using extra pillows to raise your head up higher while you sleep.  Do not smoke, and try not to be around smoke. If you need help quitting, ask your doctor.  Do not have caffeine.  Do not drink alcohol.  Rest as needed. Contact a doctor if:  You have new problems (symptoms).  You cough up yellow fluid (pus).  Your cough does not get better after 2-3 weeks, or your cough gets worse.  Medicine does not help your cough and you are not sleeping well.  You have pain that gets worse or pain that is not helped with medicine.  You have a fever.  You are losing weight and you do not know why.  You have night sweats. Get help right away if:  You cough up blood.  You have trouble breathing.  Your heartbeat is very fast. This information is not intended to replace advice given to you by your health care provider. Make sure you discuss any questions you have with your health care provider. Document Released: 08/23/2011 Document Revised: 05/17/2016 Document Reviewed: 02/16/2015 Elsevier Interactive Patient Education  [employer].

## 2018-09-03 NOTE — Progress Notes (Addendum)
[facility] called and scheduled to transport pt home. Pt given phone numbers to schedule a ride for her radiation tomorrow.  [facility] was called and canceled transportation to home; son states he will pick pt up after work today.

## 2018-09-03 NOTE — Progress Notes (Signed)
Attempted echo.  Patient not in the room.  Will attempt at a later time.

## 2018-09-03 NOTE — Progress Notes (Signed)
Progress Note  Patient Name: Gail Moore Date of Encounter: 09/03/2018  Primary Cardiologist: Ena Dawley, MD   Subjective   The patient feels tired, denies palpitations or chest pain or SOB.  Inpatient Medications    Scheduled Meds: . arformoterol  15 mcg Nebulization Q12H  . budesonide  0.5 mg Nebulization BID  . cephALEXin  500 mg Oral Q12H  . diltiazem  60 mg Oral Q12H  . enoxaparin (LOVENOX) injection  40 mg Subcutaneous Q24H  . famotidine  20 mg Oral QHS  . feeding supplement (ENSURE ENLIVE)  237 mL Oral BID BM  . furosemide  20 mg Intravenous BID  . guaiFENesin  1,200 mg Oral BID  . insulin aspart  0-15 Units Subcutaneous TID WC  . insulin aspart  0-5 Units Subcutaneous QHS  . lidocaine  1 patch Transdermal Q24H  . montelukast  10 mg Oral QHS  . nystatin  5 mL Oral QID  . potassium chloride  40 mEq Oral BID   Continuous Infusions: . sodium chloride Stopped (08/31/18 0404)   PRN Meds: sodium chloride, acetaminophen **OR** acetaminophen, benzonatate, HYDROcodone-acetaminophen, HYDROcodone-homatropine, labetalol, levalbuterol, ondansetron **OR** ondansetron (ZOFRAN) IV   Vital Signs    Vitals:   09/02/18 2139 09/02/18 2156 09/03/18 0458 09/03/18 1034  BP:  120/66 116/62   Pulse:   (!) 104   Resp:   15   Temp:   98 F (36.7 C)   TempSrc:   Oral   SpO2: 100%  100% 99%  Weight:      Height:        Intake/Output Summary (Last 24 hours) at 09/03/2018 1430 Last data filed at 09/03/2018 0509 Gross per 24 hour  Intake 560 ml  Output 400 ml  Net 160 ml   Filed Weights   08/30/18 1801  Weight: 55 kg    Telemetry    Not on telemetry - Personally Reviewed  ECG    No new ECG - Personally Reviewed  Physical Exam   GEN: No acute distress.   Neck: No JVD Cardiac: RRR, no murmurs, rubs, or gallops.  Respiratory: Clear to auscultation bilaterally. GI: Soft, nontender, non-distended  MS: No edema; No deformity. Neuro:  Nonfocal  Psych: Normal  affect   Labs    Chemistry Recent Labs  Lab 08/30/18 1653 08/31/18 0546 09/01/18 0617 09/02/18 0622 09/03/18 0620  NA 139 138 138 138 139  K 2.8* 3.4* 3.5 3.2* 3.2*  CL 101 104 103 104 106  CO2 26 25 22 23 22   GLUCOSE 270* 116* 88 219* 117*  BUN 6* 8 9 15 14   CREATININE 0.63 0.35* 0.44 0.52 0.48  CALCIUM 7.7* 7.6* 8.0* 8.2* 8.0*  PROT 5.5* 5.3*  --   --   --   ALBUMIN 1.9* 1.9*  --   --   --   AST 27 16  --   --   --   ALT 23 20  --   --   --   ALKPHOS 132* 114  --   --   --   BILITOT 1.1 0.6  --   --   --   GFRNONAA >60 >60 >60 >60 >60  GFRAA >60 >60 >60 >60 >60  ANIONGAP 12 9 13 11 11      Hematology Recent Labs  Lab 09/01/18 0617 09/02/18 0622 09/03/18 0620  WBC 16.1* 16.5* 12.4*  RBC 2.93* 2.94* 2.96*  HGB 8.5* 8.6* 8.5*  HCT 25.9* 26.1* 26.5*  MCV 88.4 88.8 89.5  MCH 29.0 29.3 28.7  MCHC 32.8 33.0 32.1  RDW 18.9* 19.1* 19.5*  PLT 152 162 144*    Cardiac EnzymesNo results for input(s): TROPONINI in the last 168 hours. No results for input(s): TROPIPOC in the last 168 hours.   BNPNo results for input(s): BNP, PROBNP in the last 168 hours.   DDimer No results for input(s): DDIMER in the last 168 hours.   Radiology        Cardiac Studies   Patient Profile     67 y.o. 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   Assessment & Plan    1. SVT in the settings of sepsis, malnutrition, anemia, hypokalemia and hypomagnesemia 2. Hypokalemia and hypo magnesemia - resolved 3. COPD per IM 4. HCAP with sepsis.  5. abd pain to palpation around site of skin graft  Echocardiogram shows LVEF 60-65%, no significant valvular abnormalities, Mild pericardial effusion with no signs of tamponade.  Discontinue lasix, continue  Cardizem SR 60 mg po BID.  [facility] will sign off.     Medication Recommendations: as above Other recommendations (labs, testing, etc):  No further testing Follow up as an outpatient:  As needed  For questions or updates, please contact [place] Please consult [email or web address] for contact info under        Signed, Ena Dawley, MD  09/03/2018, 2:30 PM

## 2018-09-03 NOTE — Discharge Summary (Addendum)
Discharge Summary  Gail Moore KTG:[id number] DOB: Sep 12, 1951  PCP: Marco Collie, MD  Admit date: 08/30/2018 Discharge date: 09/03/2018  Time spent: 25 minutes  Recommendations for Outpatient Follow-up:  1. Follow-up with your oncologist 2. Continue radiation therapy as scheduled 3. Follow-up with your primary care provider 4. Take your medications as prescribed 5. Continue home health PT 6. Fall precautions  Discharge Diagnoses:  Active Hospital Problems   Diagnosis Date Noted  . Malnutrition of moderate degree 09/02/2018  . Pressure injury of skin 09/02/2018  . Hypomagnesemia 08/30/2018  . Anemia 08/30/2018  . Hyperglycemia 08/30/2018  . Pleural effusion 08/30/2018  . S/P AKA (above knee amputation) bilateral ([place]) 08/30/2018  . Protein-calorie malnutrition, severe 08/19/2018  . HCAP (healthcare-associated pneumonia) 08/16/2018  . Acute respiratory failure with hypoxia ([place]) 08/16/2018  . Hypokalemia 08/16/2018  . Lung cancer ([place]) 08/16/2018  . Sepsis ([place]) 08/16/2018    Resolved Hospital Problems  No resolved problems to display.    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 Presented withfeverup to 103 at home, cough and shortness of breath. Recently was admitted with similar presentation diagnosed with postobstructive pneumonia and discharged on 08/23/18. Admitted for HCAP.  Hospital course complicated by 2 episodes of hemoptysis on 08/31/2018 and supraventricular tachycardia which responded well to beta-blocker.  Cardiology consulted and 2D echo pending.  Started on Lasix 20 mg IV twice daily per cardiology and Cardizem SR 60 mg p.o. twice  daily.  If abnormal LVEF will be switched to beta-blocker per cardiology.  Also reports of generalized weakness.  PT assessed and recommended home health PT/OT.  09/03/2018: Patient seen and examined at her bedside.  No acute events overnight.  No new complaints.  On the day of discharge the patient was hemodynamically stable.  She will need to follow-up with her cardiologist, PCP, oncologist, continue with her radiation therapy as outpatient.  Hospital Course:  Active Problems:   HCAP (healthcare-associated pneumonia)   Lung cancer ([place])   Acute respiratory failure with hypoxia ([facility])   Hypokalemia   Sepsis ([place])   Protein-calorie malnutrition, severe   Hypomagnesemia   Anemia   Hyperglycemia   Pleural effusion   S/P AKA (above knee amputation) bilateral (H[facility])   Malnutrition of moderate degree   Pressure injury of skin  . HCAP (healthcare-associated pneumonia)persistent postobstructive Completed 5 days of IV antibiotics Continue breathing treatment as needed   .  Resolved acute respiratory failure with hypoxia (HCC)likely secondary to postobstructive pneumonia and right-sided lobular lung collapse.   [name] Resolved hypokalemia  . Lung cancer (HCC)-admitting physician discussed with oncology on-call patient's primary oncologist does not round at [facility]. Patient will follow-up as an outpatient if patient is still admitted on Monday could discuss with primary oncologist goals of care overall andplan of management given recurrent admissions and worsening functional status  . Protein-calorie malnutrition, severe-Albumin 1.9.  Continue oral supplement.  Encourage increase protein calorie intake.  . Sepsis (HCC)sepsis physiology is resolving.  Generalized weakness: PT assessed and recommended home health PT/OT.  Fall precautions.  . Hypomagnesemia-repleted   Hypokalemia Potassium 3.2 Repleted with 40 mEq p.o. potassium x2. Follow-up with  your PCP  . Anemia of chronic  disease: Hemoglobin stable at 8.5.  No sign of overt bleeding.  . Steroid-induced hyperglycemiahemoglobin A1c 7.4; insulin sliding scale  . Pleural effusion-Per review of prior CT suspect large portion of a shadow consistent of collapsed lung although pleural effusion present in the past did not seem to worsen  COPD possibly with acute exacerbation- Continue duo nebs as needed  Continue 5 days of prednisone Continue COPD medications  Resolved accelerated hypertension: Continue home antihypertensive medications.    Sinus tachycardia: Heart rate ranging between 110 and 120.  Not previously on any rate controlled/AV nodal blockade medications. Cardiology consulted and started on new medication  New stage II sacral decubitus ulcer, suspect present on admission Wound dressing per nursing  Code Status: DNR  Family Communication:  Son on the phone  Disposition Plan: Home with home health PT and OT    Consultants:  Cardiology    Discharge Exam: BP 116/62 (BP Location: Right Arm)   Pulse (!) 104   Temp 98 F (36.7 C) (Oral)   Resp 15   Ht 4\' 4"  (1.321 m)   Wt 55 kg   SpO2 99%   BMI 31.53 kg/m  . General: 68 y.o. year-old female well developed well nourished in no acute distress.  Alert and oriented x3. . Cardiovascular: Regular rate and rhythm with no rubs or gallops.  No thyromegaly or JVD noted.   [name] Respiratory: Mild rales at bases.  Good inspiratory effort. . Abdomen: Soft nontender nondistended with normal bowel sounds x4 quadrants. . Musculoskeletal: Bilateral below the knee amputee. [name] Psychiatry: Mood is appropriate for condition and setting  Discharge Instructions You were cared for by a hospitalist during your hospital stay. If you have any questions about your discharge medications or the care you received while you were in the hospital after you are discharged, you can call the unit and asked to speak with the hospitalist on call if the hospitalist that  took care of you is not available. Once you are discharged, your primary care physician will handle any further medical issues. Please note that NO REFILLS for any discharge medications will be authorized once you are discharged, as it is imperative that you return to your primary care physician (or establish a relationship with a primary care physician if you do not have one) for your aftercare needs so that they can reassess your need for medications and monitor your lab values.   Allergies as of 09/03/2018   No Known Allergies     Medication List    STOP taking these medications   albuterol (2.5 MG/3ML) 0.083% nebulizer solution Commonly known as:  PROVENTIL   amoxicillin-clavulanate 875-125 MG tablet Commonly known as:  AUGMENTIN   feeding supplement (ENSURE ENLIVE) Liqd   formoterol 20 MCG/2ML nebulizer solution Commonly known as:  PERFOROMIST Replaced by:  arformoterol 15 MCG/2ML Nebu   HYDROcodone-homatropine 5-1.5 MG/5ML syrup Commonly known as:  HYCODAN   pantoprazole 40 MG tablet Commonly known as:  PROTONIX   potassium chloride 10 MEQ tablet Commonly known as:  K-DUR,KLOR-CON   predniSONE 10 MG (21) Tbpk tablet Commonly known as:  STERAPRED UNI-PAK 21 TAB   predniSONE 10 MG tablet Commonly known as:  DELTASONE     TAKE these medications   arformoterol 15 MCG/2ML Nebu Commonly known as:  BROVANA Take 2 mLs (15 mcg total) by nebulization every 12 (twelve) hours. Replaces:  formoterol 20 MCG/2ML nebulizer solution   budesonide 0.5 MG/2ML nebulizer  solution Commonly known as:  PULMICORT Take 2 mLs (0.5 mg total) by nebulization 2 (two) times daily. What changed:  when to take this   cephALEXin 500 MG capsule Commonly known as:  KEFLEX Take 1 capsule (500 mg total) by mouth every 12 (twelve) hours for 5 days.   diltiazem 60 MG 12 hr capsule Commonly known as:  CARDIZEM SR Take 1 capsule (60 mg total) by mouth every 12 (twelve) hours.   famotidine 20 MG  tablet Commonly known as:  PEPCID Take 1 tablet (20 mg total) by mouth at bedtime.   guaiFENesin 600 MG 12 hr tablet Commonly known as:  MUCINEX Take 2 tablets (1,200 mg total) by mouth 2 (two) times daily. What changed:  how much to take   levalbuterol 0.63 MG/3ML nebulizer solution Commonly known as:  XOPENEX Take 3 mLs (0.63 mg total) by nebulization every 4 (four) hours as needed for wheezing or shortness of breath.   lip balm ointment Apply topically as needed for lip care.   montelukast 10 MG tablet Commonly known as:  SINGULAIR Take 10 mg by mouth at bedtime.   nystatin 100000 UNIT/ML suspension Commonly known as:  MYCOSTATIN Take 5 mLs (500,000 Units total) by mouth 4 (four) times daily.      No Known Allergies Follow-up Information    Marco Collie, MD. Call in 1 day(s).   Specialty:  Family Medicine Contact information: [place] [address] [phone]        Dorothy Spark, MD. Call in 1 day(s).   Specialty:  Cardiology Why:  Please call for an appointment. Contact information: [address] [place] [address] [phone]            The results of significant diagnostics from this hospitalization (including imaging, microbiology, ancillary and laboratory) are listed below for reference.    Significant Diagnostic Studies: Dg Chest 2 View  Result Date: 08/30/2018 CLINICAL DATA:  Fever and weakness. EXAM: CHEST - 2 VIEW COMPARISON:  08/22/2018 FINDINGS: Stable appearance of right suprahilar soft tissue mass. Geometric opacity in the right lower hemithorax likely corresponds to previously demonstrated right middle and right lower lobe collapse. There is a superimposed right pleural effusion. Osseous structures are without acute abnormality. Soft tissues are grossly normal. IMPRESSION: Stable appearance of large right suprahilar soft tissue mass. Geometric opacity in the right lower hemithorax likely corresponds  to previously demonstrated right middle and right lower lobe collapse. There is a superimposed right pleural effusion. Electronically Signed   By: Fidela Salisbury M.D.   On: 08/30/2018 18:09   Dg Chest Port 1 View  Result Date: 09/01/2018 CLINICAL DATA:  Hemoptysis. EXAM: PORTABLE CHEST 1 VIEW COMPARISON:  Radiograph of August 30, 2018. CT scan of August 16, 2018. FINDINGS: Stable appearance of large right suprahilar mass. Left lung is clear. Stable atelectasis of right lower lobe is noted with possible associated pleural effusion. Bony thorax is unremarkable. No pneumothorax is noted. IMPRESSION: Stable appearance of large right suprahilar mass is noted with stable atelectasis of right lower lobe with possible associated pleural effusion, most likely postobstructive in etiology. Electronically Signed   By: Marijo Conception, M.D.   On: 09/01/2018 07:49   Dg Chest Port 1 View  Result Date: 08/22/2018 CLINICAL DATA:  Shortness of breath EXAM: PORTABLE CHEST 1 VIEW COMPARISON:  08/21/2018 FINDINGS: Cardiac shadow is stable. Changes consistent with the known history of central right lower lobe mass lesion with consolidation and  effusion are again seen. Large right paratracheal adenopathy is again identified and stable. The left lung remains clear. No new focal abnormality is noted. IMPRESSION: Stable changes of right hilar mass with peripheral consolidation and mediastinal adenopathy. No new focal abnormality is seen. Electronically Signed   By: Inez Catalina M.D.   On: 08/22/2018 06:58   Dg Chest Port 1 View  Result Date: 08/21/2018 CLINICAL DATA:  Shortness of breath EXAM: PORTABLE CHEST 1 VIEW COMPARISON:  Previous CT from 08/16/2018 FINDINGS: Cardiac shadow is mildly enlarged. The aorta again demonstrates atherosclerotic calcifications. Large right paratracheal mass lesion is noted shown as metastatic adenopathy on recent CT examination. Consolidation in the right lower lobe is again noted consistent  with the known mass lesion stable from previous CT. The left lung is clear. No bony abnormality is noted. IMPRESSION: Persistent changes on the right similar to that seen on prior CT examination. Electronically Signed   By: Inez Catalina M.D.   On: 08/21/2018 10:53   Dg Swallowing Func-speech Pathology  Result Date: 09/02/2018 Objective Swallowing Evaluation: Type of Study: MBS-Modified Barium Swallow Study  Patient Details Name: Devanee Pomplun MRN: [id number] Date of Birth: 09-Jul-1951 Today's Date: 09/02/2018 Time: SLP Start Time (ACUTE ONLY): 1220 -SLP Stop Time (ACUTE ONLY): 1239 SLP Time Calculation (min) (ACUTE ONLY): 19 min Past Medical History: Past Medical History: Diagnosis Date . Asthma  . Peripheral vascular disease [facility])  Past Surgical History: Past Surgical History: Procedure Laterality Date . ABOVE KNEE LEG AMPUTATION Bilateral  . APPENDECTOMY   . COLON SURGERY   . VIDEO BRONCHOSCOPY Bilateral 07/31/2018  Procedure: VIDEO BRONCHOSCOPY WITHOUT FLUORO;  Surgeon: Tanda Rockers, MD;  Location: [facility];  Service: Cardiopulmonary;  Laterality: Bilateral; HPI: 67 y.o. 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  Pt with diagnosis of PNA last admitted in late August and is now back with fever  Subjective: pt awake in chair Assessment / Plan / Recommendation CHL IP CLINICAL IMPRESSIONS 09/02/2018 Clinical Impression Patient presents with functional oropharyngeal swallow ability.  NO aspiration and only trace laryngeal penetration of thin cleared within the same swallow.  Pharyngeal swallow is strong without residuals.  Please note, pt coughed during evaluation without evidence of barium in larynx.  She was not ideally positioned due to her wound and reports she is eating with HOB reclined.  Upon esophageal sweep, esophagus appeared clear.  Barium tablet appeared to readily clear esophagus also.   Educated pt to findings/recommendations using teach back.  SLP will follow up to provide respiratory muscle strength training to faciliate strength of cough.  Pt agreeable to plan.   SLP Visit Diagnosis Dysphagia, unspecified (R13.10) Attention and concentration deficit following -- Frontal lobe and executive function deficit following -- Impact on safety and function Mild aspiration risk   CHL IP TREATMENT RECOMMENDATION 09/02/2018 Treatment Recommendations Therapy as outlined in treatment plan below   Prognosis 09/02/2018 Prognosis for Safe Diet Advancement Good Barriers to Reach Goals -- Barriers/Prognosis Comment -- CHL IP DIET RECOMMENDATION 09/02/2018 SLP Diet Recommendations Regular solids;Thin liquid Liquid Administration via Straw;Cup Medication Administration (No Data) Compensations Slow rate;Small sips/bites Postural Changes Remain semi-upright after after feeds/meals (Comment);Seated upright at 90 degrees   CHL IP OTHER RECOMMENDATIONS 09/02/2018 Recommended Consults -- Oral Care Recommendations Oral care BID Other Recommendations --   CHL IP FOLLOW UP RECOMMENDATIONS 09/01/2018 Follow up Recommendations (No Data)  CHL IP FREQUENCY AND DURATION 09/02/2018 Speech Therapy Frequency (ACUTE ONLY) min 1 x/week Treatment Duration 1 week      CHL IP ORAL PHASE 09/02/2018 Oral Phase Impaired Oral - Pudding Teaspoon -- Oral - Pudding Cup -- Oral - Honey Teaspoon -- Oral - Honey Cup -- Oral - Nectar Teaspoon -- Oral - Nectar Cup WFL Oral - Nectar Straw -- Oral - Thin Teaspoon WFL Oral - Thin Cup WFL Oral - Thin Straw WFL Oral - Puree WFL Oral - Mech Soft WFL Oral - Regular -- Oral - Multi-Consistency -- Oral - Pill WFL Oral Phase - Comment --  CHL IP PHARYNGEAL PHASE 09/02/2018 Pharyngeal Phase Impaired Pharyngeal- Pudding Teaspoon -- Pharyngeal -- Pharyngeal- Pudding Cup -- Pharyngeal -- Pharyngeal- Honey Teaspoon -- Pharyngeal -- Pharyngeal- Honey Cup -- Pharyngeal -- Pharyngeal- Nectar Teaspoon -- Pharyngeal --  Pharyngeal- Nectar Cup WFL Pharyngeal -- Pharyngeal- Nectar Straw -- Pharyngeal -- Pharyngeal- Thin Teaspoon WFL Pharyngeal -- Pharyngeal- Thin Cup Penetration/Aspiration during swallow Pharyngeal Material enters airway, remains ABOVE vocal cords then ejected out Pharyngeal- Thin Straw Penetration/Aspiration during swallow Pharyngeal Material enters airway, remains ABOVE vocal cords then ejected out Pharyngeal- Puree WFL;Delayed swallow initiation-vallecula Pharyngeal -- Pharyngeal- Mechanical Soft -- Pharyngeal -- Pharyngeal- Regular WFL Pharyngeal -- Pharyngeal- Multi-consistency -- Pharyngeal -- Pharyngeal- Pill WFL Pharyngeal -- Pharyngeal Comment --  CHL IP CERVICAL ESOPHAGEAL PHASE 09/02/2018 Cervical Esophageal Phase WFL Pudding Teaspoon -- Pudding Cup -- Honey Teaspoon -- Honey Cup -- Nectar Teaspoon -- Nectar Cup -- Nectar Straw -- Thin Teaspoon -- Thin Cup -- Thin Straw -- Puree -- Mechanical Soft -- Regular -- Multi-consistency -- Pill -- Cervical Esophageal Comment -- Macario Golds 09/02/2018, 2:07 PM  Luanna Salk, [facility] SLP [phone]              Microbiology: Recent Results (from the past 240 hour(s))  Urine culture     Status: None   Collection Time: 08/30/18  5:18 PM  Result Value Ref Range Status   Specimen Description   Final    URINE, CATHETERIZED Performed at [facility], [place] [address] [place], [place] [address]    Special Requests   Final    NONE Performed at [facility], [place] [address] [place], [place] [address]    Culture   Final    NO GROWTH Performed at [facility], [place] [address] [place], [place] [address]    Report Status 09/01/2018 FINAL  Final  Culture, blood (routine x 2)     Status: None (Preliminary result)   Collection Time: 08/30/18  5:55 PM  Result Value Ref Range Status   Specimen Description   Final    BLOOD LEFT HAND Performed at [facility], [place] [address] [place], [place] [address]     Special Requests   Final    BOTTLES DRAWN AEROBIC AND ANAEROBIC Blood Culture results may not be optimal due to an excessive volume of blood received in culture bottles Performed at [place] [address] [place], [place] [address]    Culture   Final    NO GROWTH 3 DAYS Performed at [facility], [place] [address] [place], [place] [address]    Report Status PENDING  Incomplete  Culture, blood (routine x 2)     Status: None (Preliminary result)   Collection Time: 08/30/18  6:00 PM  Result Value Ref Range Status   Specimen Description   Final  BLOOD RIGHT FOREARM Performed at [facility], [place] [address], [place], [place] [address]    Special Requests   Final    BOTTLES DRAWN AEROBIC AND ANAEROBIC Blood Culture results may not be optimal due to an excessive volume of blood received in culture bottles Performed at [place] [address], [place], [place] [address]    Culture   Final    NO GROWTH 3 DAYS Performed at [facility], [place] [address], [place], [place] [address]    Report Status PENDING  Incomplete  MRSA PCR Screening     Status: None   Collection Time: 08/31/18  9:47 AM  Result Value Ref Range Status   MRSA by PCR NEGATIVE NEGATIVE Final    Comment:        The GeneXpert MRSA Assay (FDA approved for NASAL specimens only), is one component of a comprehensive MRSA colonization surveillance program. It is not intended to diagnose MRSA infection nor to guide or monitor treatment for MRSA infections. Performed at [facility], [place] [address], [place], [place] [address]      Labs: Basic Metabolic Panel: Recent Labs  Lab 08/30/18 1653 08/31/18 0546 09/01/18 0617 09/02/18 0622 09/03/18 0620  NA 139 138 138 138 139  K 2.8* 3.4* 3.5 3.2* 3.2*  CL 101 104 103 104 106  CO2 26 25 22 23 22   GLUCOSE 270* 116* 88 219* 117*  BUN 6* 8 9 15 14   CREATININE 0.63 0.35* 0.44 0.52 0.48    CALCIUM 7.7* 7.6* 8.0* 8.2* 8.0*  MG 1.4* 1.9  --  1.6* 2.2  PHOS 3.2 2.8  --   --   --    Liver Function Tests: Recent Labs  Lab 08/30/18 1653 08/31/18 0546  AST 27 16  ALT 23 20  ALKPHOS 132* 114  BILITOT 1.1 0.6  PROT 5.5* 5.3*  ALBUMIN 1.9* 1.9*   No results for input(s): LIPASE, AMYLASE in the last 168 hours. No results for input(s): AMMONIA in the last 168 hours. CBC: Recent Labs  Lab 08/30/18 1653 08/31/18 0546 09/01/18 0617 09/02/18 0622 09/03/18 0620  WBC 17.6* 13.5* 16.1* 16.5* 12.4*  HGB 9.2* 9.0* 8.5* 8.6* 8.5*  HCT 28.1* 27.6* 25.9* 26.1* 26.5*  MCV 88.9 89.9 88.4 88.8 89.5  PLT 150 142* 152 162 144*   Cardiac Enzymes: No results for input(s): CKTOTAL, CKMB, CKMBINDEX, TROPONINI in the last 168 hours. BNP: BNP (last 3 results) No results for input(s): BNP in the last 8760 hours.  ProBNP (last 3 results) No results for input(s): PROBNP in the last 8760 hours.  CBG: Recent Labs  Lab 09/02/18 0754 09/02/18 1632 09/02/18 1952 09/03/18 0731 09/03/18 1126  GLUCAP 214* 223* 191* 123* 111*       Signed:  Kayleen Memos, MD [facility] 09/03/2018, 2:28 PM

## 2018-09-03 NOTE — Progress Notes (Signed)
  Echocardiogram 2D Echocardiogram has been performed.  Gail Moore 09/03/2018, 1:05 PM

## 2018-09-03 NOTE — Progress Notes (Signed)
Occupational Therapy Treatment Patient Details Name: Gail Moore MRN: [id number] DOB: [birth year] Today's Date: 09/03/2018    History of present illness 67 y.o. 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  Pt with diagnosis of PNA last admitted in late August and is now back with fever.     OT comments  Pt tolerated session well. HR 105-111 during adl from bed level.  Rolled for bedpan due to urgency.  Pt on lasix at this time  Follow Up Recommendations  Home health OT;Supervision/Assistance - 24 hour    Equipment Recommendations  None recommended by OT    Recommendations for Other Services      Precautions / Restrictions Precautions Precautions: Fall Precaution Comments: Bil AKA Restrictions Weight Bearing Restrictions: No       Mobility Bed Mobility     Rolling: Modified independent (Device/Increase time)         General bed mobility comments: easier to roll to L  Transfers                      Balance                                           ADL either performed or assessed with clinical judgement   ADL           Upper Body Bathing: Set up;Standing   Lower Body Bathing: Bed level;Moderate assistance   Upper Body Dressing : Minimal assistance;Bed level           Toileting- Clothing Manipulation and Hygiene: Moderate assistance;Bed level         General ADL Comments: performed ADL. Pt had lasix; using purewick.  Rolled to bil sides for bedpan mod A for hygiene     Vision       Perception     Praxis      Cognition Arousal/Alertness: Awake/alert Behavior During Therapy: WFL for tasks assessed/performed Overall Cognitive Status: History of cognitive impairments - at baseline                                 General Comments: wfls during adl session        Exercises     Shoulder  Instructions       General Comments      Pertinent Vitals/ Pain       Pain Assessment: Faces Faces Pain Scale: Hurts little more Pain Location: buttocks Pain Descriptors / Indicators: Sore Pain Intervention(s): Limited activity within patient's tolerance;Monitored during session;Premedicated before session;Repositioned  Home Living                                          Prior Functioning/Environment              Frequency  Min 2X/week        Progress Toward Goals  OT Goals(current goals can now be found in the care plan section)  Progress towards OT goals: Progressing toward goals     Plan      Co-evaluation  AM-PAC PT "6 Clicks" Daily Activity     Outcome Measure   Help from another person eating meals?: None Help from another person taking care of personal grooming?: A Little Help from another person toileting, which includes using toliet, bedpan, or urinal?: A Lot Help from another person bathing (including washing, rinsing, drying)?: A Lot Help from another person to put on and taking off regular upper body clothing?: A Little Help from another person to put on and taking off regular lower body clothing?: A Lot 6 Click Score: 16    End of Session    OT Visit Diagnosis: Muscle weakness (generalized) (M62.81)   Activity Tolerance Patient tolerated treatment well;Patient limited by fatigue   Patient Left in bed;with call bell/phone within reach   Nurse Communication          Time: 3354-5625 OT Time Calculation (min): 27 min  Charges: OT General Charges $OT Visit: 1 Visit OT Treatments $Self Care/Home Management : 23-37 mins  Lesle Chris, OTR/L [phone] 09/03/2018   Gail Moore 09/03/2018, 8:29 AM

## 2018-09-04 ENCOUNTER — Ambulatory Visit
Admission: RE | Admit: 2018-09-04 | Discharge: 2018-09-04 | Disposition: A | Discharge status: Home / Self Care | Payer: Medicare Other | Source: Ambulatory Visit | Attending: Radiation Oncology | Admitting: Radiation Oncology

## 2018-09-04 DIAGNOSIS — Z510 Encounter for antineoplastic radiation therapy: Secondary | ICD-10-CM | POA: Diagnosis not present

## 2018-09-04 DIAGNOSIS — C3431 Malignant neoplasm of lower lobe, right bronchus or lung: Secondary | ICD-10-CM | POA: Diagnosis not present

## 2018-09-05 ENCOUNTER — Ambulatory Visit
Admission: RE | Admit: 2018-09-05 | Discharge: 2018-09-05 | Disposition: A | Discharge status: Home / Self Care | Payer: Medicare Other | Source: Ambulatory Visit | Attending: Radiation Oncology | Admitting: Radiation Oncology

## 2018-09-05 DIAGNOSIS — C3431 Malignant neoplasm of lower lobe, right bronchus or lung: Secondary | ICD-10-CM | POA: Diagnosis not present

## 2018-09-05 DIAGNOSIS — Z510 Encounter for antineoplastic radiation therapy: Secondary | ICD-10-CM | POA: Diagnosis not present

## 2018-09-05 LAB — CULTURE, BLOOD (ROUTINE X 2)
Culture: NO GROWTH
Culture: NO GROWTH

## 2018-09-08 ENCOUNTER — Ambulatory Visit: Payer: Medicare Other

## 2018-09-08 ENCOUNTER — Ambulatory Visit
Admission: RE | Admit: 2018-09-08 | Discharge: 2018-09-08 | Disposition: A | Discharge status: Home / Self Care | Payer: Medicare Other | Source: Ambulatory Visit | Attending: Radiation Oncology | Admitting: Radiation Oncology

## 2018-09-08 ENCOUNTER — Other Ambulatory Visit: Payer: Self-pay

## 2018-09-08 DIAGNOSIS — C3431 Malignant neoplasm of lower lobe, right bronchus or lung: Secondary | ICD-10-CM | POA: Diagnosis not present

## 2018-09-08 DIAGNOSIS — Z510 Encounter for antineoplastic radiation therapy: Secondary | ICD-10-CM | POA: Diagnosis not present

## 2018-09-08 MED ORDER — FUROSEMIDE 20 MG PO TABS: ORAL_TABLET | ORAL | 0 refills | Status: AC

## 2018-09-09 ENCOUNTER — Ambulatory Visit
Admission: RE | Admit: 2018-09-09 | Discharge: 2018-09-09 | Disposition: A | Discharge status: Home / Self Care | Payer: Medicare Other | Source: Ambulatory Visit | Attending: Radiation Oncology | Admitting: Radiation Oncology

## 2018-09-09 DIAGNOSIS — Z510 Encounter for antineoplastic radiation therapy: Secondary | ICD-10-CM | POA: Diagnosis not present

## 2018-09-09 DIAGNOSIS — C3431 Malignant neoplasm of lower lobe, right bronchus or lung: Secondary | ICD-10-CM | POA: Diagnosis not present

## 2018-09-10 ENCOUNTER — Encounter: Payer: Self-pay | Admitting: Radiation Oncology

## 2018-09-10 DIAGNOSIS — E43 Unspecified severe protein-calorie malnutrition: Secondary | ICD-10-CM | POA: Diagnosis not present

## 2018-09-10 DIAGNOSIS — I739 Peripheral vascular disease, unspecified: Secondary | ICD-10-CM | POA: Diagnosis not present

## 2018-09-10 DIAGNOSIS — J440 Chronic obstructive pulmonary disease with acute lower respiratory infection: Secondary | ICD-10-CM | POA: Diagnosis not present

## 2018-09-10 DIAGNOSIS — C3490 Malignant neoplasm of unspecified part of unspecified bronchus or lung: Secondary | ICD-10-CM | POA: Diagnosis not present

## 2018-09-10 DIAGNOSIS — J189 Pneumonia, unspecified organism: Secondary | ICD-10-CM | POA: Diagnosis not present

## 2018-09-10 DIAGNOSIS — Z89511 Acquired absence of right leg below knee: Secondary | ICD-10-CM | POA: Diagnosis not present

## 2018-09-10 NOTE — Progress Notes (Signed)
  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 She was hospitalized for shortness of breath halfway through her treatments, and she was cared for at home by her son. She reported shortness of breath and denied fatigue throughout treatment. Towards the beginning of treatment, she reported hemoptysis that is pink to bright red and denied difficulty with swallowing. By the halfway point, she reported a cough and denied hemoptysis for the remainder of treatments. The cough began as dry and later developed into productive with clear sputum. Towards the end of treatment, she reported pain associated with swallowing and denied chest pain. Edema to right breast with pain and without fever noted, along with thrush, increased heart rate, and audible chest congestion. By the end of treatment, her edema continued in the right breast and into the right arm. Her son reported poor eating and drinking.   Plan: The patient has completed radiation treatment. The patient will return to radiation oncology clinic for routine followup in one month. I advised them to call or return sooner if they have any questions or concerns related to their recovery or treatment.  -----------------------------------  Blair Promise, PhD, MD  This document serves as a record of services personally performed by Gery Pray, MD. It was created on his behalf by Wilburn Mylar, a trained medical scribe. The creation of this record is based on the scribe's personal observations and the  provider's statements to them. This document has been checked and approved by the attending provider.

## 2018-09-11 DIAGNOSIS — Z89511 Acquired absence of right leg below knee: Secondary | ICD-10-CM | POA: Diagnosis not present

## 2018-09-11 DIAGNOSIS — J440 Chronic obstructive pulmonary disease with acute lower respiratory infection: Secondary | ICD-10-CM | POA: Diagnosis not present

## 2018-09-11 DIAGNOSIS — E43 Unspecified severe protein-calorie malnutrition: Secondary | ICD-10-CM | POA: Diagnosis not present

## 2018-09-11 DIAGNOSIS — J189 Pneumonia, unspecified organism: Secondary | ICD-10-CM | POA: Diagnosis not present

## 2018-09-11 DIAGNOSIS — I739 Peripheral vascular disease, unspecified: Secondary | ICD-10-CM | POA: Diagnosis not present

## 2018-09-11 DIAGNOSIS — C3490 Malignant neoplasm of unspecified part of unspecified bronchus or lung: Secondary | ICD-10-CM | POA: Diagnosis not present

## 2018-09-15 DIAGNOSIS — J189 Pneumonia, unspecified organism: Secondary | ICD-10-CM | POA: Diagnosis not present

## 2018-09-15 DIAGNOSIS — J440 Chronic obstructive pulmonary disease with acute lower respiratory infection: Secondary | ICD-10-CM | POA: Diagnosis not present

## 2018-09-15 DIAGNOSIS — I739 Peripheral vascular disease, unspecified: Secondary | ICD-10-CM | POA: Diagnosis not present

## 2018-09-15 DIAGNOSIS — Z89511 Acquired absence of right leg below knee: Secondary | ICD-10-CM | POA: Diagnosis not present

## 2018-09-15 DIAGNOSIS — C3490 Malignant neoplasm of unspecified part of unspecified bronchus or lung: Secondary | ICD-10-CM | POA: Diagnosis not present

## 2018-09-15 DIAGNOSIS — E43 Unspecified severe protein-calorie malnutrition: Secondary | ICD-10-CM | POA: Diagnosis not present

## 2018-09-16 DIAGNOSIS — C3431 Malignant neoplasm of lower lobe, right bronchus or lung: Secondary | ICD-10-CM | POA: Diagnosis not present

## 2018-09-16 DIAGNOSIS — J440 Chronic obstructive pulmonary disease with acute lower respiratory infection: Secondary | ICD-10-CM | POA: Diagnosis not present

## 2018-09-16 DIAGNOSIS — Z89511 Acquired absence of right leg below knee: Secondary | ICD-10-CM | POA: Diagnosis not present

## 2018-09-16 DIAGNOSIS — C3490 Malignant neoplasm of unspecified part of unspecified bronchus or lung: Secondary | ICD-10-CM | POA: Diagnosis not present

## 2018-09-16 DIAGNOSIS — I739 Peripheral vascular disease, unspecified: Secondary | ICD-10-CM | POA: Diagnosis not present

## 2018-09-16 DIAGNOSIS — J189 Pneumonia, unspecified organism: Secondary | ICD-10-CM | POA: Diagnosis not present

## 2018-09-16 DIAGNOSIS — R918 Other nonspecific abnormal finding of lung field: Secondary | ICD-10-CM | POA: Diagnosis not present

## 2018-09-16 DIAGNOSIS — E43 Unspecified severe protein-calorie malnutrition: Secondary | ICD-10-CM | POA: Diagnosis not present

## 2018-09-18 DIAGNOSIS — J189 Pneumonia, unspecified organism: Secondary | ICD-10-CM | POA: Diagnosis not present

## 2018-09-18 DIAGNOSIS — E43 Unspecified severe protein-calorie malnutrition: Secondary | ICD-10-CM | POA: Diagnosis not present

## 2018-09-18 DIAGNOSIS — I739 Peripheral vascular disease, unspecified: Secondary | ICD-10-CM | POA: Diagnosis not present

## 2018-09-18 DIAGNOSIS — J440 Chronic obstructive pulmonary disease with acute lower respiratory infection: Secondary | ICD-10-CM | POA: Diagnosis not present

## 2018-09-18 DIAGNOSIS — Z89511 Acquired absence of right leg below knee: Secondary | ICD-10-CM | POA: Diagnosis not present

## 2018-09-18 DIAGNOSIS — C3490 Malignant neoplasm of unspecified part of unspecified bronchus or lung: Secondary | ICD-10-CM | POA: Diagnosis not present

## 2018-09-19 DIAGNOSIS — R600 Localized edema: Secondary | ICD-10-CM

## 2018-09-19 DIAGNOSIS — J449 Chronic obstructive pulmonary disease, unspecified: Secondary | ICD-10-CM

## 2018-09-19 DIAGNOSIS — R000 Tachycardia, unspecified: Secondary | ICD-10-CM | POA: Diagnosis not present

## 2018-09-19 DIAGNOSIS — D6489 Other specified anemias: Secondary | ICD-10-CM

## 2018-09-19 DIAGNOSIS — D638 Anemia in other chronic diseases classified elsewhere: Secondary | ICD-10-CM | POA: Diagnosis not present

## 2018-09-19 DIAGNOSIS — E46 Unspecified protein-calorie malnutrition: Secondary | ICD-10-CM | POA: Diagnosis not present

## 2018-09-19 DIAGNOSIS — Z923 Personal history of irradiation: Secondary | ICD-10-CM | POA: Diagnosis not present

## 2018-09-19 DIAGNOSIS — B373 Candidiasis of vulva and vagina: Secondary | ICD-10-CM

## 2018-09-19 DIAGNOSIS — D630 Anemia in neoplastic disease: Secondary | ICD-10-CM | POA: Diagnosis not present

## 2018-09-19 DIAGNOSIS — E860 Dehydration: Secondary | ICD-10-CM | POA: Diagnosis not present

## 2018-09-19 DIAGNOSIS — I959 Hypotension, unspecified: Secondary | ICD-10-CM

## 2018-09-19 DIAGNOSIS — R300 Dysuria: Secondary | ICD-10-CM | POA: Diagnosis not present

## 2018-09-19 DIAGNOSIS — C3431 Malignant neoplasm of lower lobe, right bronchus or lung: Secondary | ICD-10-CM | POA: Diagnosis not present

## 2018-09-19 DIAGNOSIS — E871 Hypo-osmolality and hyponatremia: Secondary | ICD-10-CM

## 2018-09-23 DIAGNOSIS — C3490 Malignant neoplasm of unspecified part of unspecified bronchus or lung: Secondary | ICD-10-CM | POA: Diagnosis not present

## 2018-09-24 DIAGNOSIS — C3490 Malignant neoplasm of unspecified part of unspecified bronchus or lung: Secondary | ICD-10-CM | POA: Diagnosis not present

## 2018-09-25 DIAGNOSIS — C3490 Malignant neoplasm of unspecified part of unspecified bronchus or lung: Secondary | ICD-10-CM | POA: Diagnosis not present

## 2018-09-26 DIAGNOSIS — C3490 Malignant neoplasm of unspecified part of unspecified bronchus or lung: Secondary | ICD-10-CM | POA: Diagnosis not present

## 2018-09-29 DIAGNOSIS — C3490 Malignant neoplasm of unspecified part of unspecified bronchus or lung: Secondary | ICD-10-CM | POA: Diagnosis not present

## 2018-10-01 DIAGNOSIS — C3490 Malignant neoplasm of unspecified part of unspecified bronchus or lung: Secondary | ICD-10-CM | POA: Diagnosis not present

## 2018-10-02 DIAGNOSIS — C3490 Malignant neoplasm of unspecified part of unspecified bronchus or lung: Secondary | ICD-10-CM | POA: Diagnosis not present

## 2018-10-03 DIAGNOSIS — C3490 Malignant neoplasm of unspecified part of unspecified bronchus or lung: Secondary | ICD-10-CM | POA: Diagnosis not present

## 2018-10-06 DIAGNOSIS — C3490 Malignant neoplasm of unspecified part of unspecified bronchus or lung: Secondary | ICD-10-CM | POA: Diagnosis not present

## 2018-10-07 DIAGNOSIS — C3490 Malignant neoplasm of unspecified part of unspecified bronchus or lung: Secondary | ICD-10-CM | POA: Diagnosis not present

## 2018-10-09 DIAGNOSIS — C3490 Malignant neoplasm of unspecified part of unspecified bronchus or lung: Secondary | ICD-10-CM | POA: Diagnosis not present

## 2018-10-13 DIAGNOSIS — C3490 Malignant neoplasm of unspecified part of unspecified bronchus or lung: Secondary | ICD-10-CM | POA: Diagnosis not present

## 2018-10-15 DIAGNOSIS — C3490 Malignant neoplasm of unspecified part of unspecified bronchus or lung: Secondary | ICD-10-CM | POA: Diagnosis not present

## 2018-10-16 ENCOUNTER — Ambulatory Visit: Admission: RE | Admit: 2018-10-16 | Payer: Medicare Other | Source: Ambulatory Visit | Admitting: Radiation Oncology

## 2018-10-16 DIAGNOSIS — C3490 Malignant neoplasm of unspecified part of unspecified bronchus or lung: Secondary | ICD-10-CM | POA: Diagnosis not present

## 2018-10-17 DIAGNOSIS — C3490 Malignant neoplasm of unspecified part of unspecified bronchus or lung: Secondary | ICD-10-CM | POA: Diagnosis not present

## 2018-10-20 DIAGNOSIS — C3490 Malignant neoplasm of unspecified part of unspecified bronchus or lung: Secondary | ICD-10-CM | POA: Diagnosis not present

## 2018-10-21 DIAGNOSIS — C3490 Malignant neoplasm of unspecified part of unspecified bronchus or lung: Secondary | ICD-10-CM | POA: Diagnosis not present

## 2018-10-22 DIAGNOSIS — C3490 Malignant neoplasm of unspecified part of unspecified bronchus or lung: Secondary | ICD-10-CM | POA: Diagnosis not present

## 2018-12-24 DEATH — deceased

## 2020-06-09 IMAGING — DX DG CHEST 2V
2 series · 2 of 2 positions shown · non-contrast
Comparison: CT 05/23/2018

CLINICAL DATA: Cough and dyspnea- time unknown per pt?  Ex smoker.

EXAM:
CHEST - 2 VIEW

[chest lat]
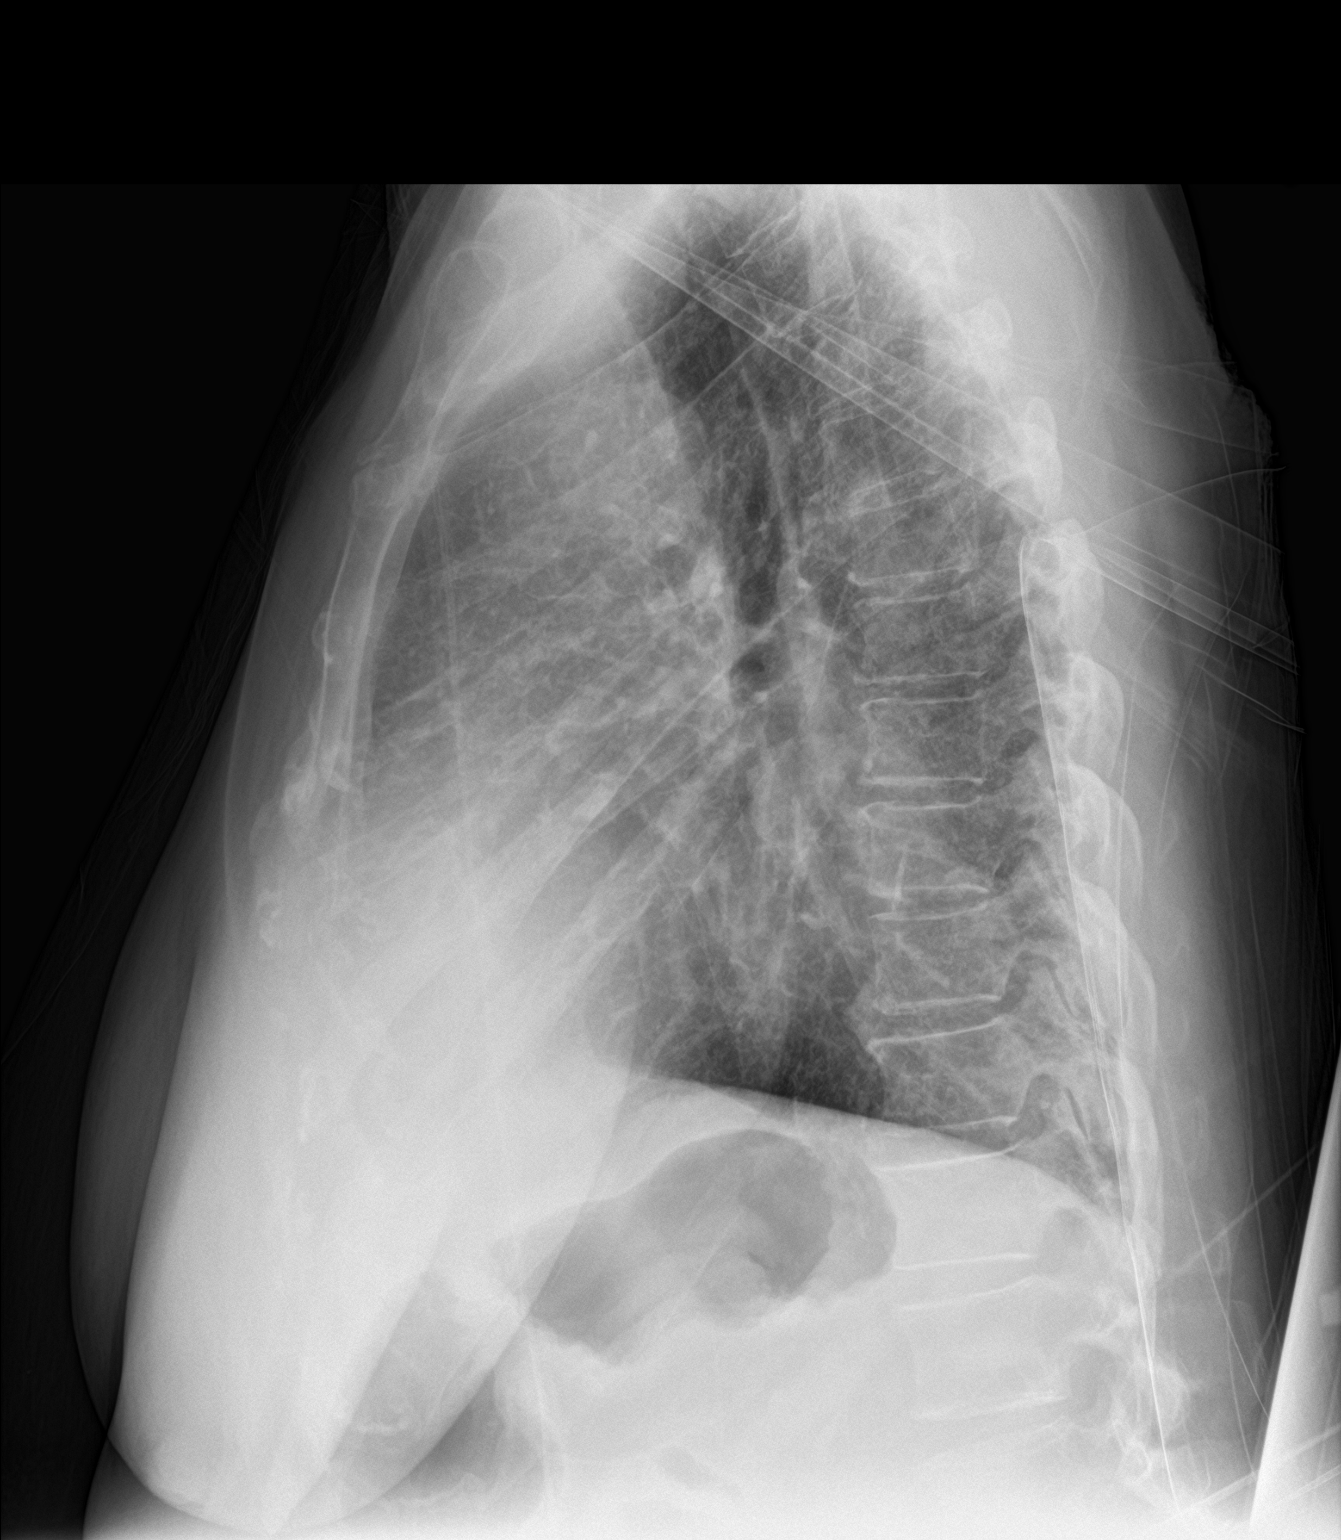

[chest ap]
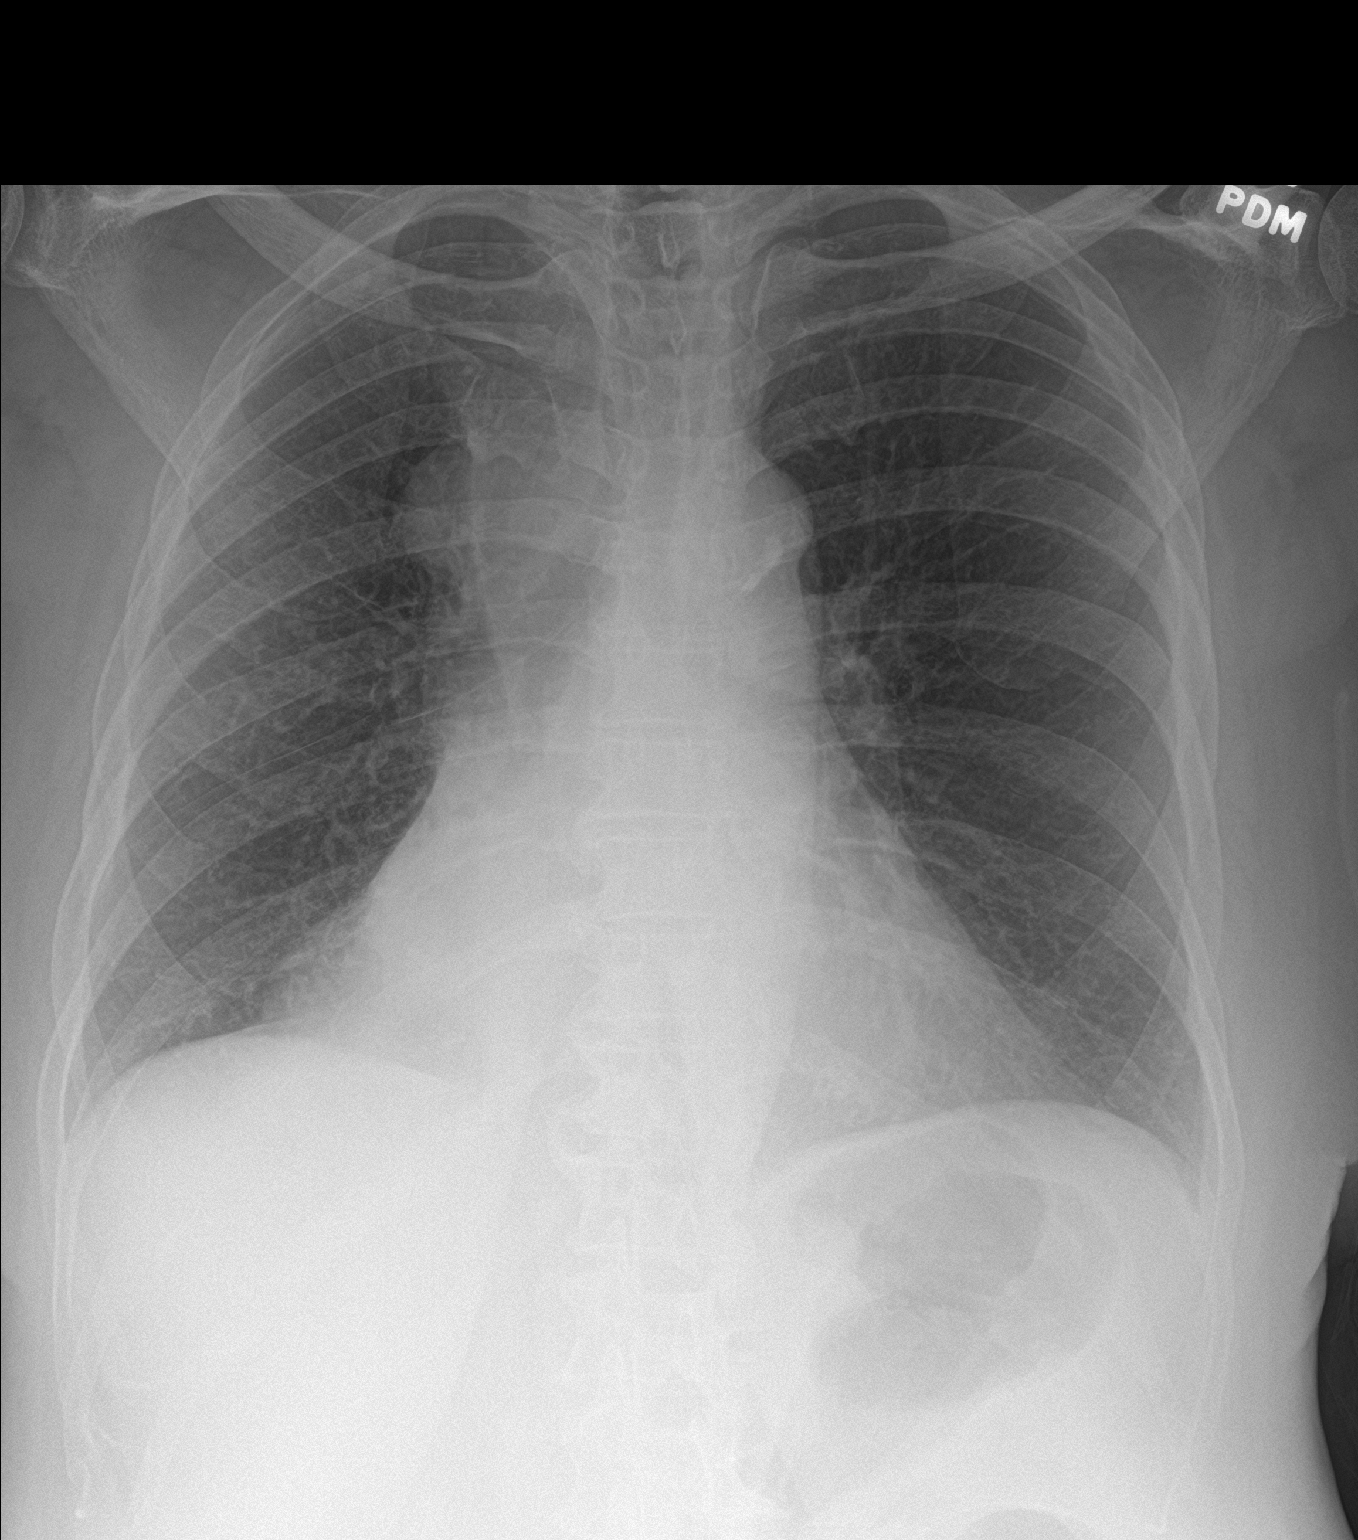

[2 of 2 positions shown; findings below may reference images not displayed]

FINDINGS: Right paratracheal mass. Right lower lung atelectasis and collapse.
Left lung clear.

Heart size upper limits normal. Aortic Atherosclerosis
([id number]).

No effusion.

Visualized bones unremarkable.
IMPRESSION: 1. Little convincing change in right lower lobe collapse and right
paratracheal mass suggesting obstructing lung carcinoma with
metastatic adenopathy.

## 2020-07-14 IMAGING — DX DG CHEST 1V PORT
1 series · 1 of 1 positions shown · non-contrast
Comparison: 08/21/2018

CLINICAL DATA: Shortness of breath

EXAM:
PORTABLE CHEST 1 VIEW

[chest ap]
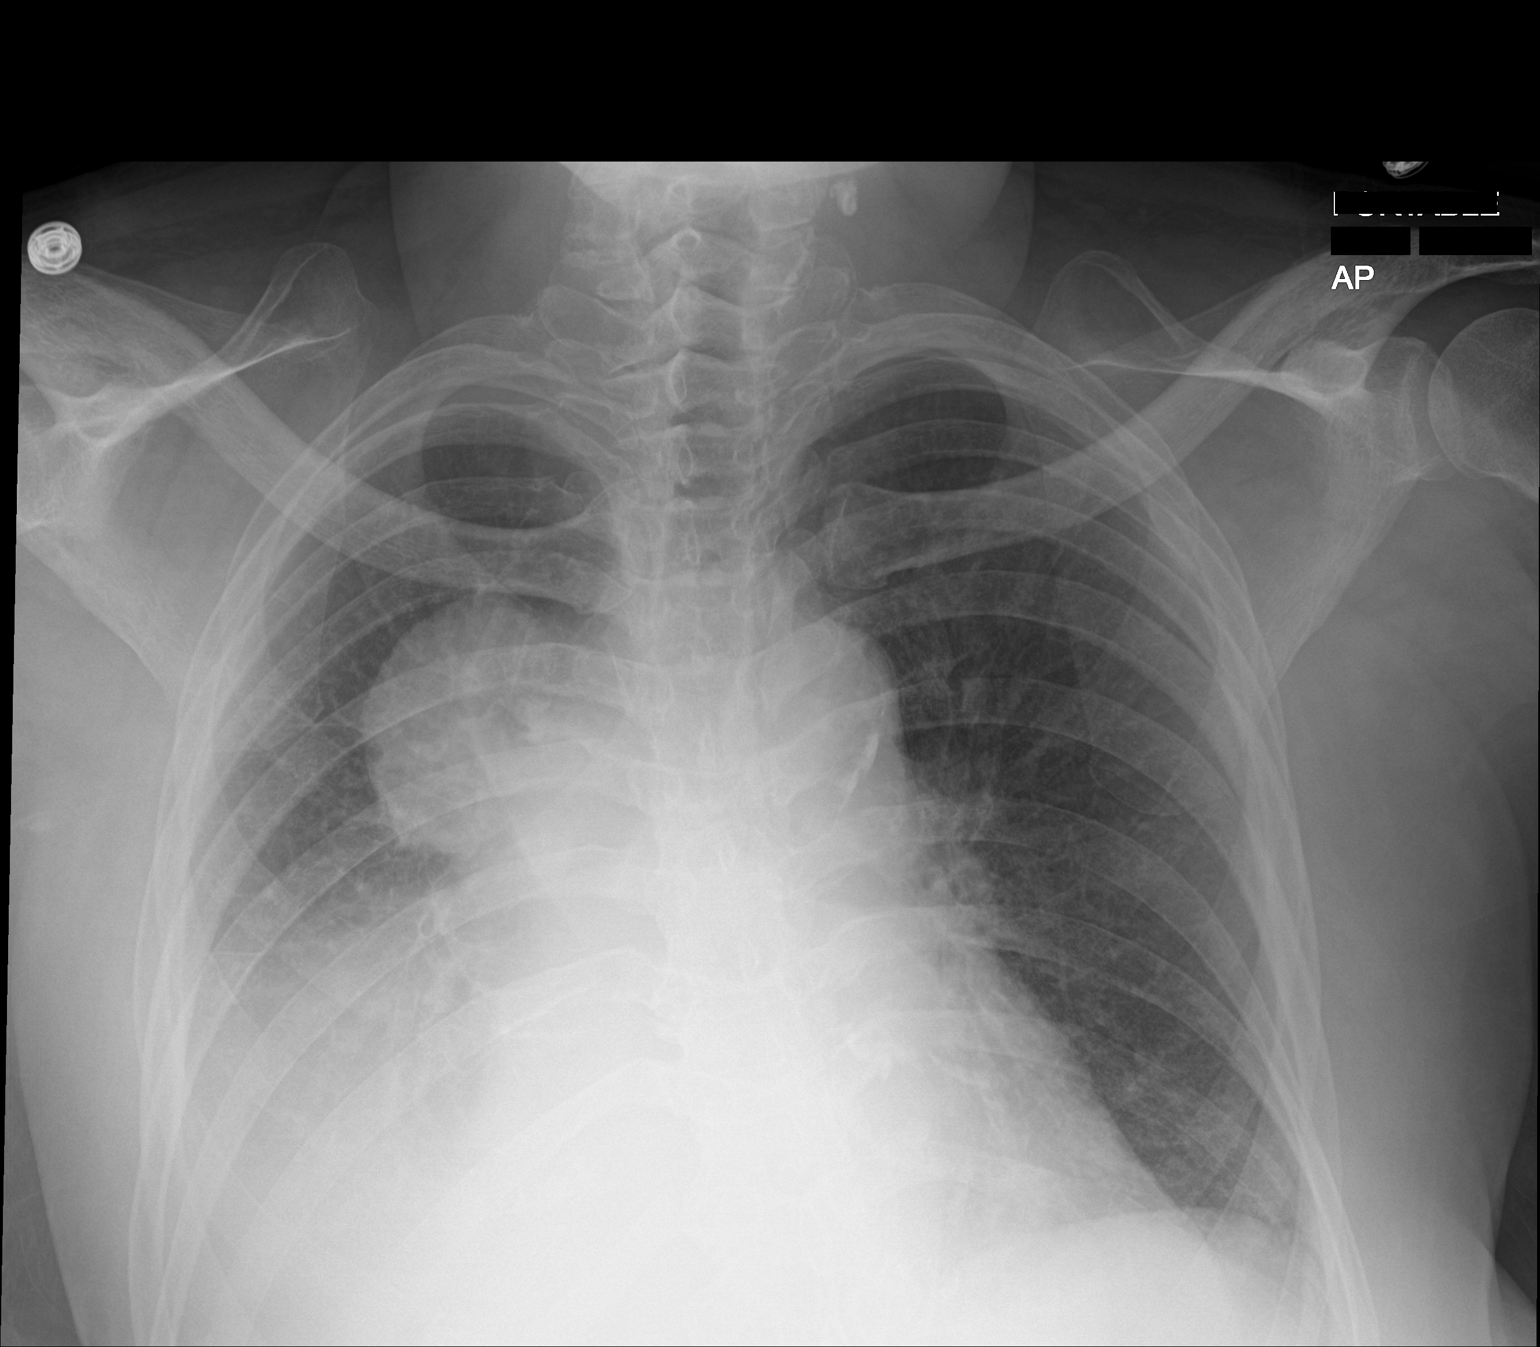

[1 of 1 positions shown; findings below may reference images not displayed]

FINDINGS: Cardiac shadow is stable. Changes consistent with the known history
of central right lower lobe mass lesion with consolidation and
effusion are again seen. Large right paratracheal adenopathy is
again identified and stable. The left lung remains clear. No new
focal abnormality is noted.
IMPRESSION: Stable changes of right hilar mass with peripheral consolidation and
mediastinal adenopathy. No new focal abnormality is seen.

## 2020-07-25 IMAGING — RF DG SWALLOWING FUNCTION - [facility]
12 series · 24 of 24 positions shown · non-contrast
Comparison: none

[Series 1: cp_standard · 0.35mm/px · 2 of 27 frames shown (1 of 12)]
[frame 5/27]
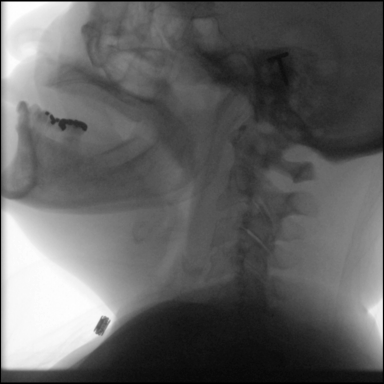
[frame 22/27]
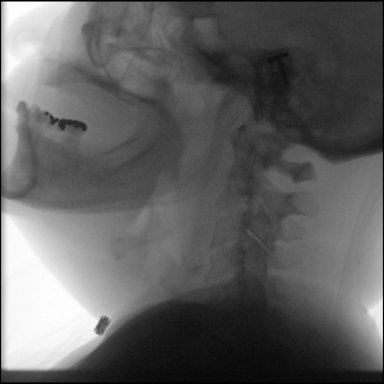

[Series 2: cp_standard · 0.35mm/px · 2 of 22 frames shown (2 of 12)]
[frame 4/22]
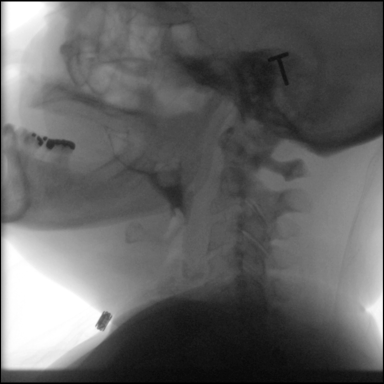
[frame 18/22]
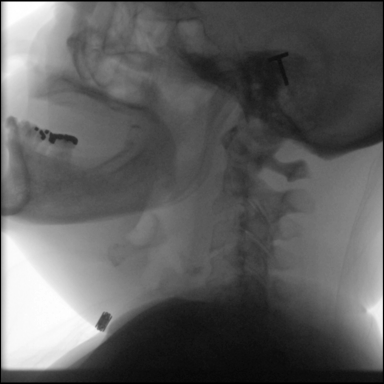

[Series 3: cp_standard · 0.35mm/px · 2 of 118 frames shown (3 of 12)]
[frame 18/118]
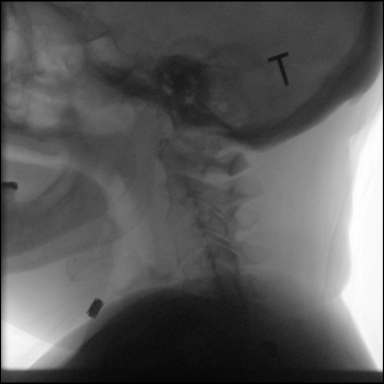
[frame 96/118]
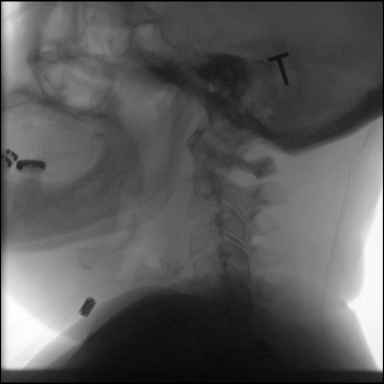

[Series 4: cp_standard · 0.35mm/px · 2 of 83 frames shown (4 of 12)]
[frame 13/83]
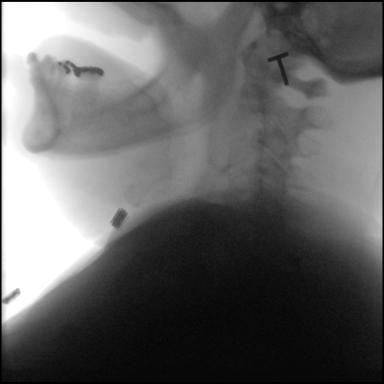
[frame 71/83]
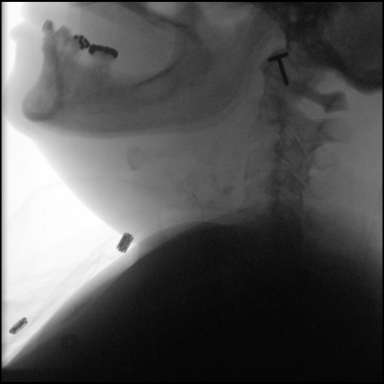

[Series 5: cp_standard · 0.35mm/px · 2 of 64 frames shown (5 of 12)]
[frame 10/64]
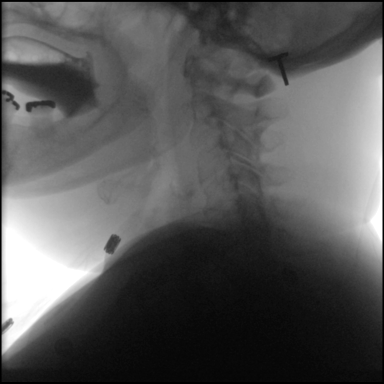
[frame 46/64]
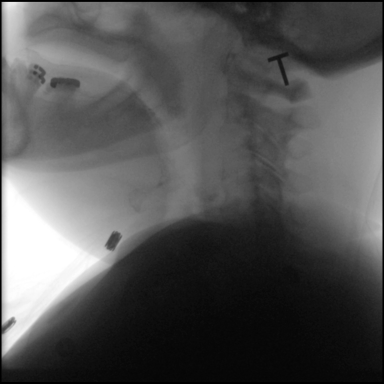

[Series 6: cp_standard · 0.35mm/px · 2 of 36 frames shown (6 of 12)]
[frame 6/36]
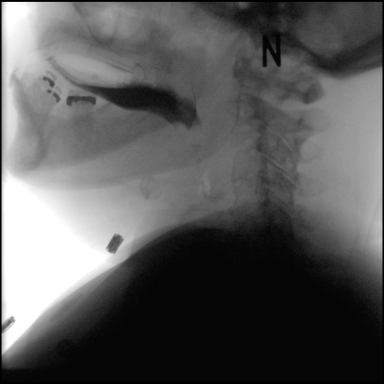
[frame 31/36]
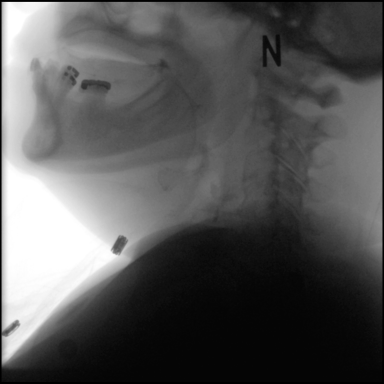

[Series 7: cp_standard · 0.35mm/px · 2 of 178 frames shown (7 of 12)]
[frame 27/178]
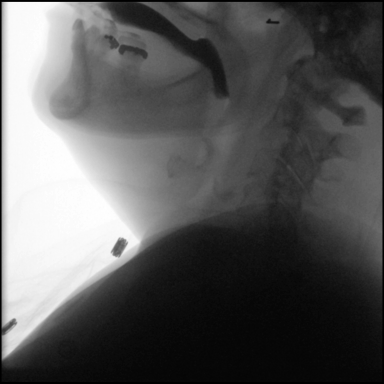
[frame 152/178]
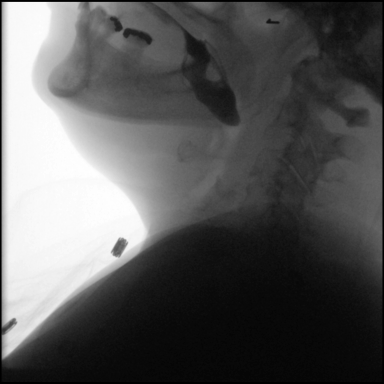

[Series 8: cp_standard · 0.35mm/px · 2 of 75 frames shown (8 of 12)]
[frame 12/75]
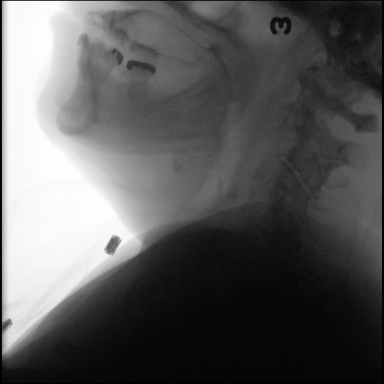
[frame 56/75]
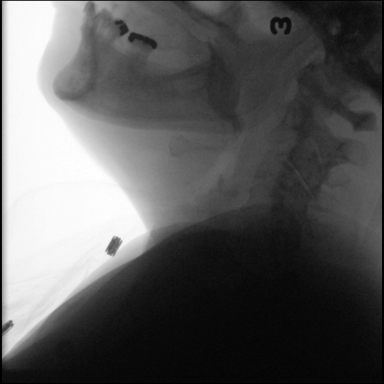

[Series 9: cp_standard · 0.34mm/px · 2 of 31 frames shown (9 of 12)]
[frame 1/31]
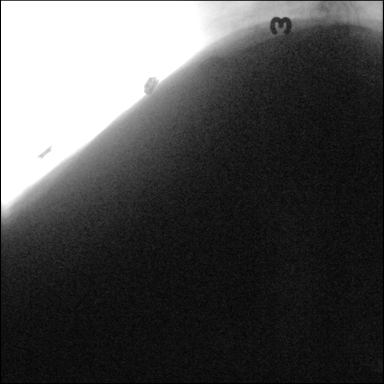
[frame 16/31]
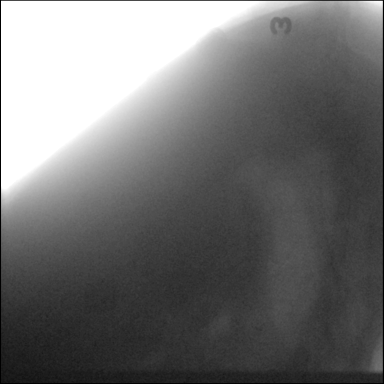

[Series 10: cp_standard · 0.34mm/px · 2 of 58 frames shown (10 of 12)]
[frame 9/58]
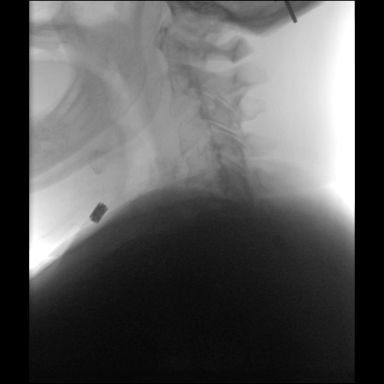
[frame 50/58]
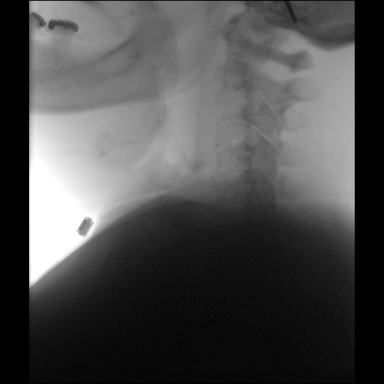

[Series 11: cp_standard · 0.35mm/px · 2 of 61 frames shown (11 of 12)]
[frame 10/61]
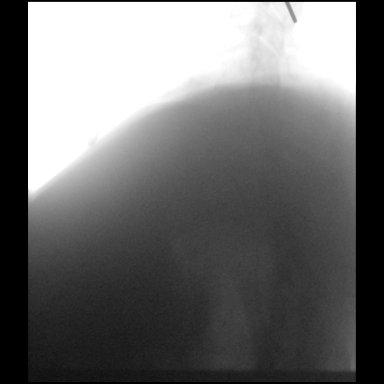
[frame 51/61]
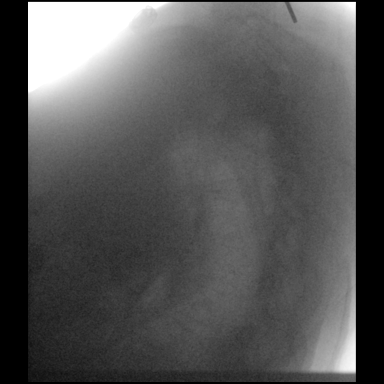

[Series 12: cp_standard · 0.35mm/px · 2 of 6 frames shown (12 of 12)]
[frame 1/6]
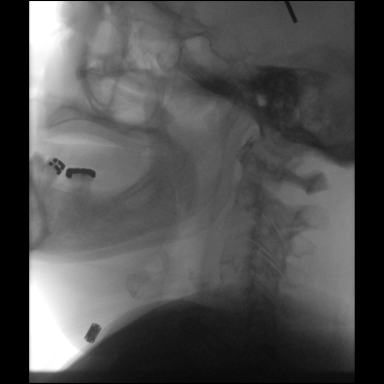
[frame 6/6]
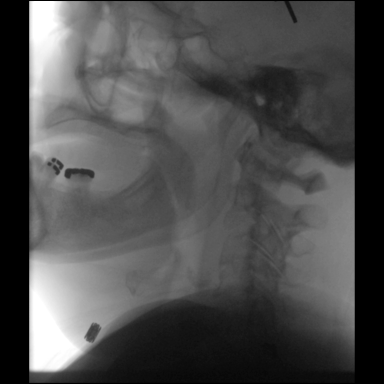

[24 of 24 positions shown; findings below may reference images not displayed]

FLUOROSCOPY FOR SWALLOWING FUNCTION STUDY:
Fluoroscopy was provided for swallowing function study, which was administered by a speech pathologist.  Final results and recommendations from this study are contained within the speech pathology report.
